# Patient Record
Sex: Male | Born: 1945 | Race: White | Hispanic: No | Smoking: Heavy tobacco smoker
Health system: Southern US, Community
[De-identification: ages and names within clinical notes are randomized; demographics above are authoritative.]

## PROBLEM LIST (undated history)

## (undated) DIAGNOSIS — I739 Peripheral vascular disease, unspecified: Secondary | ICD-10-CM

## (undated) DIAGNOSIS — M109 Gout, unspecified: Secondary | ICD-10-CM

## (undated) DIAGNOSIS — I10 Essential (primary) hypertension: Secondary | ICD-10-CM

## (undated) DIAGNOSIS — I779 Disorder of arteries and arterioles, unspecified: Secondary | ICD-10-CM

## (undated) DIAGNOSIS — I2510 Atherosclerotic heart disease of native coronary artery without angina pectoris: Secondary | ICD-10-CM

## (undated) DIAGNOSIS — Z972 Presence of dental prosthetic device (complete) (partial): Secondary | ICD-10-CM

## (undated) DIAGNOSIS — E785 Hyperlipidemia, unspecified: Secondary | ICD-10-CM

## (undated) DIAGNOSIS — I5020 Unspecified systolic (congestive) heart failure: Secondary | ICD-10-CM

## (undated) DIAGNOSIS — R200 Anesthesia of skin: Secondary | ICD-10-CM

## (undated) DIAGNOSIS — J449 Chronic obstructive pulmonary disease, unspecified: Secondary | ICD-10-CM

## (undated) DIAGNOSIS — M1990 Unspecified osteoarthritis, unspecified site: Secondary | ICD-10-CM

## (undated) HISTORY — PX: HIP FRACTURE SURGERY: SHX118

## (undated) HISTORY — PX: CORONARY ANGIOPLASTY: SHX604

---

## 2006-09-03 ENCOUNTER — Emergency Department: Payer: Self-pay | Admitting: Emergency Medicine

## 2006-12-11 ENCOUNTER — Inpatient Hospital Stay (HOSPITAL_COMMUNITY): Admission: EM | Admit: 2006-12-11 | Discharge: 2006-12-14 | Payer: Self-pay | Admitting: Emergency Medicine

## 2006-12-11 ENCOUNTER — Ambulatory Visit: Payer: Self-pay | Admitting: Internal Medicine

## 2009-10-05 ENCOUNTER — Inpatient Hospital Stay (HOSPITAL_COMMUNITY): Admission: EM | Admit: 2009-10-05 | Discharge: 2009-10-07 | Payer: Self-pay | Admitting: Emergency Medicine

## 2009-10-06 ENCOUNTER — Encounter (INDEPENDENT_AMBULATORY_CARE_PROVIDER_SITE_OTHER): Payer: Self-pay | Admitting: Internal Medicine

## 2009-10-07 ENCOUNTER — Ambulatory Visit: Payer: Self-pay | Admitting: Vascular Surgery

## 2009-10-07 ENCOUNTER — Encounter (INDEPENDENT_AMBULATORY_CARE_PROVIDER_SITE_OTHER): Payer: Self-pay | Admitting: Internal Medicine

## 2010-11-19 LAB — LIPID PANEL: Cholesterol: 105 mg/dL (ref 0–200)

## 2010-11-19 LAB — COMPREHENSIVE METABOLIC PANEL
ALT: 15 U/L (ref 0–53)
AST: 27 U/L (ref 0–37)
Albumin: 4.4 g/dL (ref 3.5–5.2)
BUN: 3 mg/dL — ABNORMAL LOW (ref 6–23)
CO2: 26 mEq/L (ref 19–32)
GFR calc Af Amer: >60 mL/min (ref >60)
GFR calc non Af Amer: >60 mL/min (ref >60)
Glucose, Bld: 141 mg/dL — ABNORMAL HIGH (ref 70–99)
Potassium: 4.4 mEq/L (ref 3.5–5.1)
Sodium: 144 mEq/L (ref 135–145)
Total Bilirubin: 0.8 mg/dL (ref 0.3–1.2)

## 2010-11-19 LAB — BASIC METABOLIC PANEL
CO2: 27 mEq/L (ref 19–32)
GFR calc Af Amer: >60 mL/min (ref >60)
GFR calc non Af Amer: >60 mL/min (ref >60)
Glucose, Bld: 92 mg/dL (ref 70–99)
Potassium: 3.6 mEq/L (ref 3.5–5.1)
Sodium: 145 mEq/L (ref 135–145)

## 2010-11-19 LAB — DIFFERENTIAL
Eosinophils Relative: 0 % (ref 0–5)
Lymphocytes Relative: 11 % — ABNORMAL LOW (ref 12–46)
Neutrophils Relative %: 85 % — ABNORMAL HIGH (ref 43–77)

## 2010-11-19 LAB — CARDIAC PANEL(CRET KIN+CKTOT+MB+TROPI)
CK, MB: 1 ng/mL (ref 0.3–4.0)
CK, MB: 1.1 ng/mL (ref 0.3–4.0)
CK, MB: 1.3 ng/mL (ref 0.3–4.0)
Relative Index: 1 (ref 0.0–2.5)
Relative Index: INVALID (ref 0.0–2.5)
Total CK: 89 U/L (ref 7–232)
Total CK: 98 U/L (ref 7–232)

## 2010-11-19 LAB — URINE CULTURE
Colony Count: NO GROWTH
Colony Count: NO GROWTH
Culture: NO GROWTH
Culture: NO GROWTH

## 2010-11-19 LAB — URINALYSIS, ROUTINE W REFLEX MICROSCOPIC
Hgb urine dipstick: NEGATIVE
Protein, ur: NEGATIVE mg/dL
Specific Gravity, Urine: 1.01 (ref 1.005–1.030)
pH: 8.5 — ABNORMAL HIGH (ref 5.0–8.0)

## 2010-11-19 LAB — HEMOGLOBIN A1C
Hgb A1c MFr Bld: 6.4 % — ABNORMAL HIGH (ref 4.6–6.1)
Mean Plasma Glucose: 137 mg/dL

## 2010-11-19 LAB — CK TOTAL AND CKMB (NOT AT ARMC): Total CK: 97 U/L (ref 7–232)

## 2010-11-19 LAB — CBC
HCT: 40.3 % (ref 39.0–52.0)
HCT: 45.5 % (ref 39.0–52.0)
Hemoglobin: 13.6 g/dL (ref 13.0–17.0)
MCV: 96.4 fL (ref 78.0–100.0)
Platelets: 149 10*3/uL — ABNORMAL LOW (ref 150–400)
WBC: 10.7 10*3/uL — ABNORMAL HIGH (ref 4.0–10.5)

## 2010-11-19 LAB — CULTURE, BLOOD (ROUTINE X 2): Culture: NO GROWTH

## 2010-11-19 LAB — RAPID URINE DRUG SCREEN, HOSP PERFORMED
Amphetamines: NOT DETECTED
Barbiturates: NOT DETECTED
Benzodiazepines: NOT DETECTED
Cocaine: NOT DETECTED
Opiates: POSITIVE — AB
Tetrahydrocannabinol: NOT DETECTED

## 2010-11-19 LAB — TROPONIN I: Troponin I: 0.01 ng/mL (ref 0.00–0.06)

## 2011-01-16 NOTE — Cardiovascular Report (Signed)
Richard Rivers, Richard [name]:  [id number]   MEDICAL RECORD NO.:  [id number]          PATIENT TYPE:  INP   LOCATION:  6741                         FACILITY:  [facility]   PHYSICIAN:  Veverly Fells. Excell Seltzer, MD  DATE OF BIRTH:  1946-08-02   DATE OF PROCEDURE:  12/13/2006  DATE OF DISCHARGE:                            CARDIAC CATHETERIZATION   PROCEDURES:  1. Left heart catheterization.  2. Selective coronary angiography.  3. Left ventricular angiography.  4. IVUS of the LAD and left circumflex.  5. Pullback to the left main.  6. StarClose of the right femoral artery.   INDICATIONS:  Richard Rivers is a very nice 65 year old gentleman with  multiple cardiovascular risk factors who presents with chest pain.  In  the setting of his multiple risk factors despite negative cardiac  enzymes and nondiagnostic EKG changes, he was referred for cardiac  catheterization.   Risks and indications of procedure were explained to the patient.  Informed consent was obtained.  The right groin was prepped, draped, and  anesthetized with 1% lidocaine.  Using a modified Seldinger technique, a  6-French sheath was placed in the right femoral artery.  Multiple views  of the left and right coronary arteries were taken using standard  catheters.  Following selective coronary angiography, an angled pigtail  catheter was inserted into left ventricle and pressures were recorded.  A pullback across the aortic valve was done.  A left ventriculogram was  performed.  Following the diagnostic portion of the procedure, I elected  to perform intravascular ultrasound of the left mainstem and left  circumflex.  Heparin was used for anticoagulation.  Initially the wire  was placed in the LAD and a pullback from the proximal LAD back through  the left mainstem was performed.  This demonstrated moderate left  mainstem disease with a minimal lumen area greater than 6 cm2.  There  was also a concerning area in the  proximal circumflex and I elected to  then place the wire in the circumflex and pullback across the proximal  circumflex into the left mainstem was performed.  At the conclusion of  the IVUS procedure, a StarClose device was used to seal the right  femoral arteriotomy.  There were no complications.   FINDINGS:  Aortic pressure 161/71 with mean of 108.  Left ventricular  pressure is 159/11 with a mean of 20.   The left mainstem has mild to moderate distal disease.  It bifurcates  into the LAD and left circumflex.  The percent stenosis by angiographic  visual estimate appears to be 40% to 50%.   The LAD is a large-caliber vessel that courses down to the LV apex.  It  gives off a large septal cascade in the area of the first septal  perforator.  There is a medium-sized diagonal branch given off from the  proximal portion of the LAD.  There are some very small second and third  diagonals.  The LAD has 30% proximal stenosis with mild calcium.  There  is no other significant angiographic disease seen throughout the LAD or  the diagonal branches.  The left circumflex has a 60% ostial stenosis.  There is a small  intermediate branch followed by what appears to be an occluded marginal  branch.  There are no collaterals to that area.  The circumflex then  goes down and gives off a second OM that is a large-caliber vessel.  There is a 60% stenosis in that area.  The true AV groove circumflex is  small.  There are left-to-right collaterals from the left circumflex  supplied to the distal right coronary artery.   Right coronary artery is occluded in its proximal portion.  It is  collateralized from the left coronary artery as above.   Left ventricular function assessed with 30 degrees RAO.  Left  ventriculogram demonstrates mild basal inferior wall hypokinesis with an  overall LVEF of 55%.   Intravascular ultrasound of the left mainstem shows moderate left  mainstem stenosis with a minimal  lumen area of 6.3 cm2.  The proximal  LAD is calcified with nonobstructive disease.   IVUS of the proximal left circumflex shows moderate disease in that  region as well.  The ostium of the left circumflex has a minimal lumen  area greater than 4 cm2.   ASSESSMENT:  1. Moderate left mainstem disease.  2. Right coronary artery occlusion supplied by collaterals from the      left coronary artery.  3. Moderate ostial left circumflex disease.  4. Mild left anterior descending artery disease.  5. Mild segmental left ventricular dysfunction with preserved left      ventricular ejection fraction   PLAN:  Initially I think it is appropriate to treat Richard Rivers with  aggressive medical therapy.  He does not meet criteria for coronary  bypass surgery, although his left mainstem minimal lumen area is close  to 6 cm2.  He has not had any objective evidence of infarction.  I think  aggressive medical therapy is warranted as first-line therapy.  If he  fails medical therapy, he will need be considered for coronary bypass  surgery.      Veverly Fells. Excell Seltzer, MD  Electronically Signed     MDC/MEDQ  D:  12/13/2006  T:  12/14/2006  Job:  [id number]
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
## 2011-02-01 IMAGING — CT CT HEAD W/O CM
1 of 2 series · 16 of 30 positions shown, 20 images · non-contrast
Comparison: None.

CLINICAL DATA: Altered level of consciousness, unresponsive
episode, slurred speech and unsteady gait.

CT HEAD WITHOUT CONTRAST
TECHNIQUE: Contiguous axial images were obtained from the base of
the skull through the vertex without contrast.

[Series 2: head routine 4.8 h37s · axial · 0.43mm/px · z∈[-145,+15]mm · 16 of 36 slices shown, 20 images]
[im 2/36  brain]
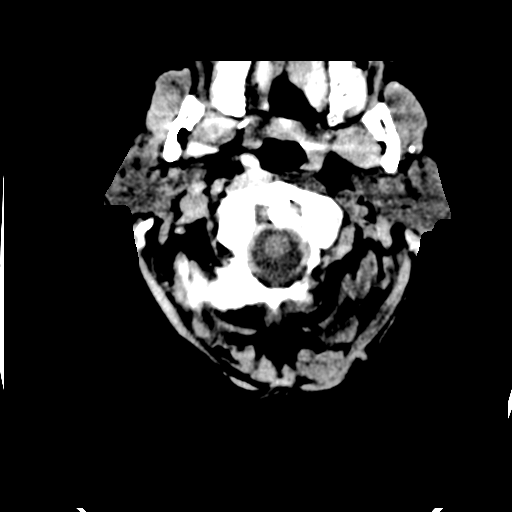
[im 2/36  bone]
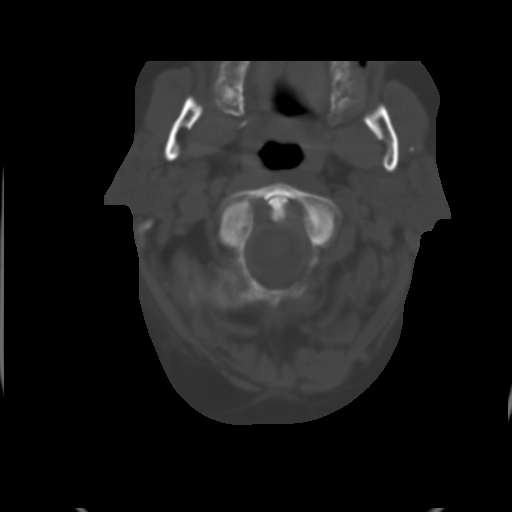
[im 4/36  brain]
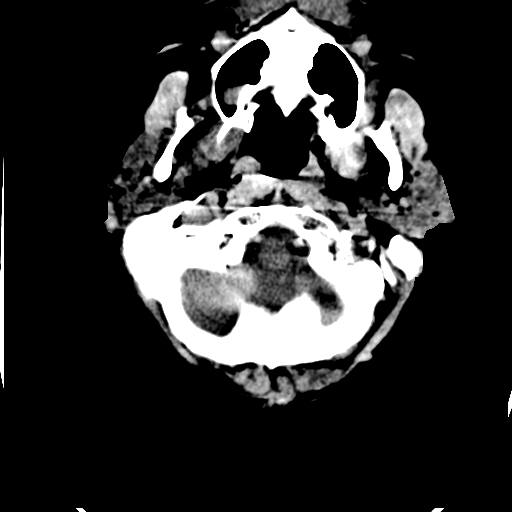
[im 6/36  brain]
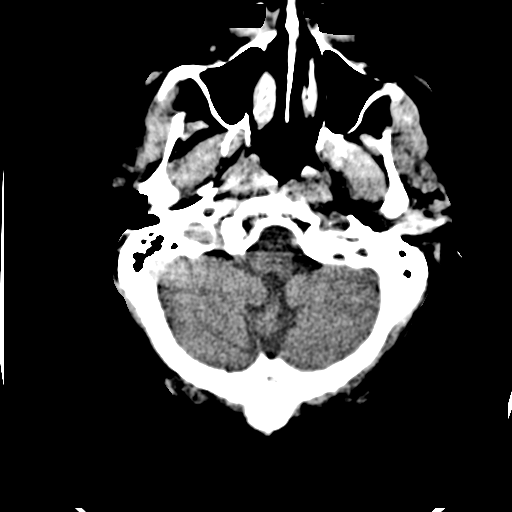
[im 8/36  brain]
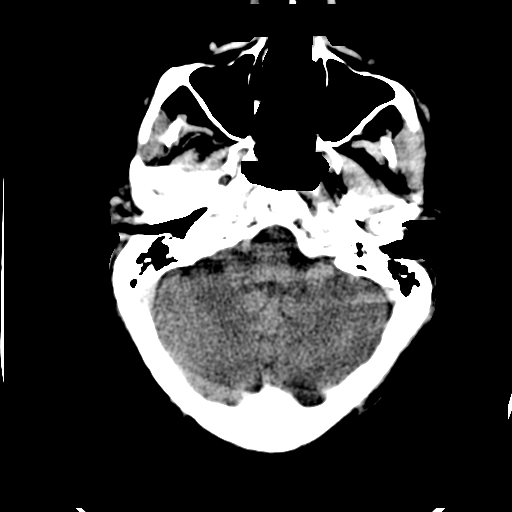
[im 12/36  brain]
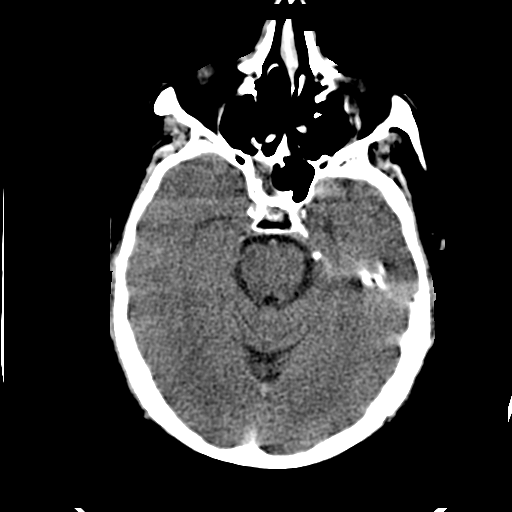
[im 12/36  bone]
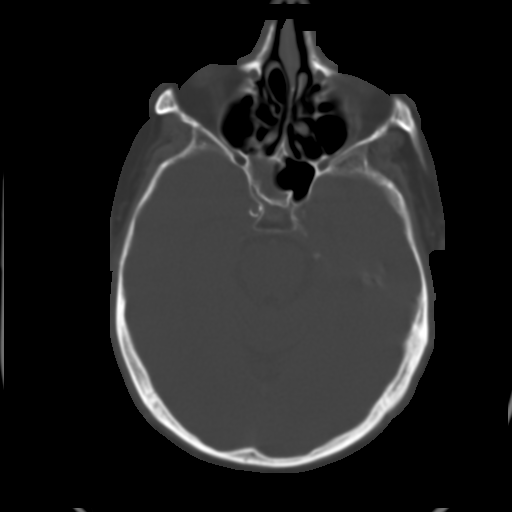
[im 13/36  brain]
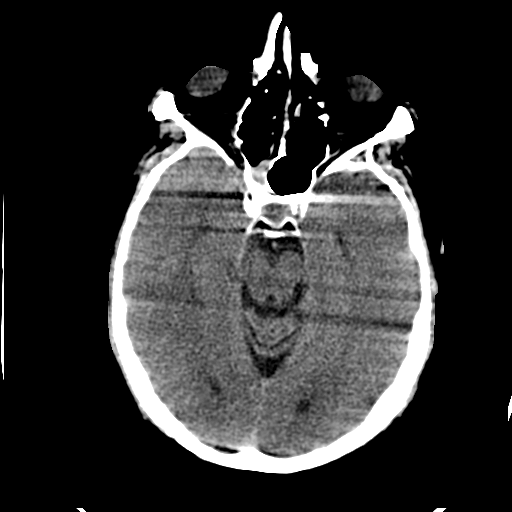
[im 15/36  brain]
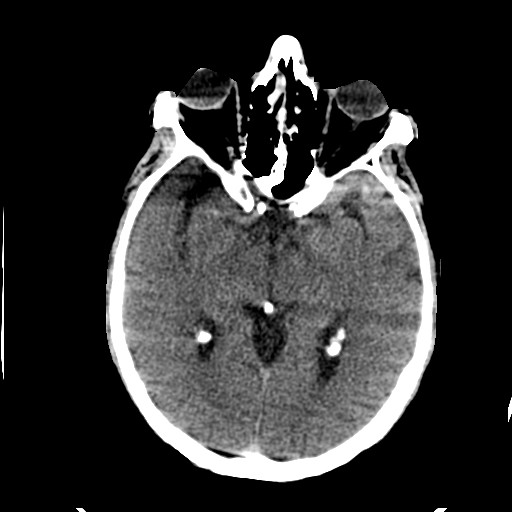
[im 17/36  brain]
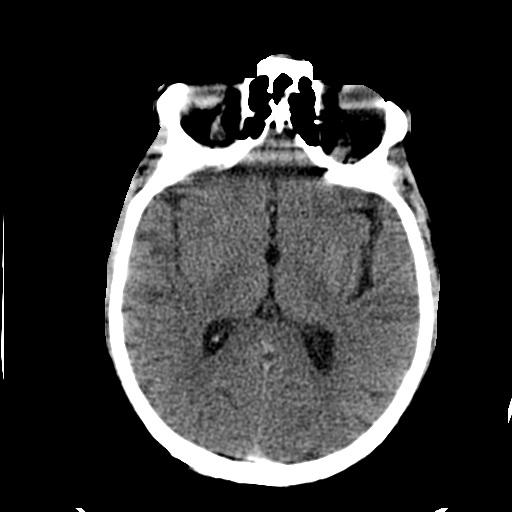
[im 19/36  brain]
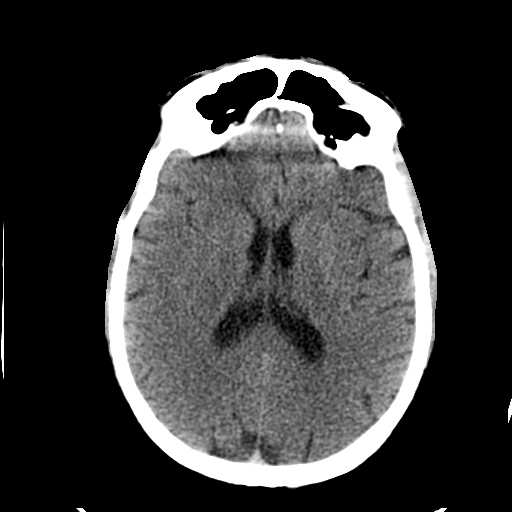
[im 19/36  bone]
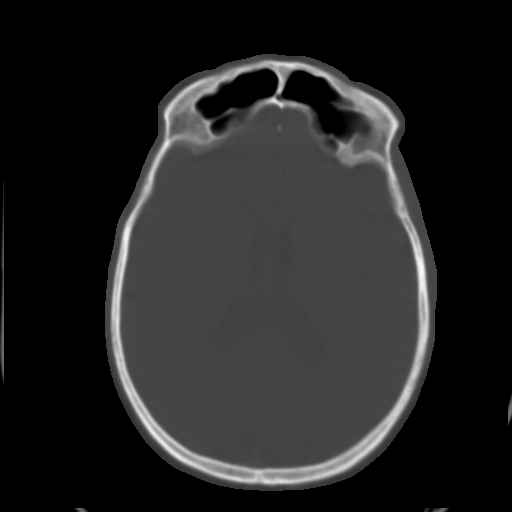
[im 21/36  brain]
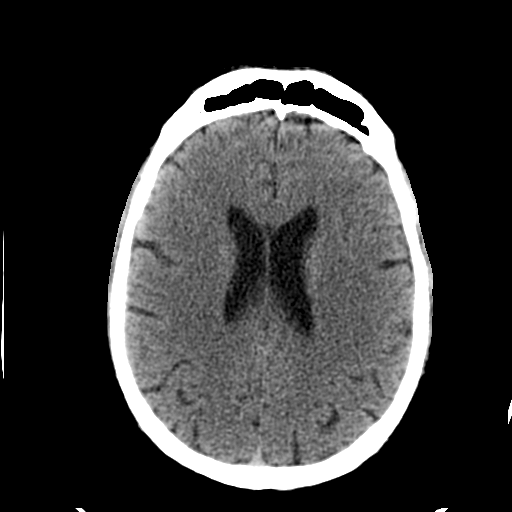
[im 23/36  brain]
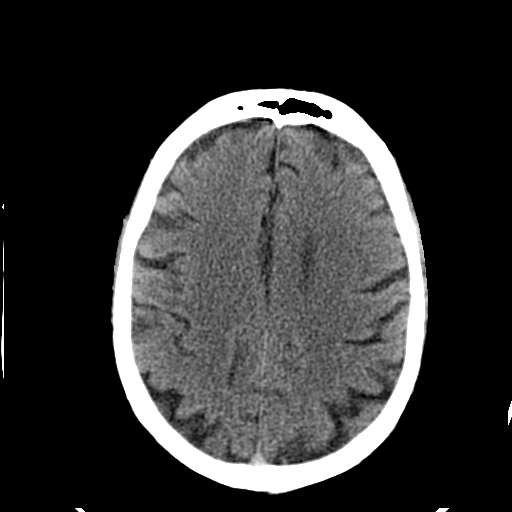
[im 24/36  brain]
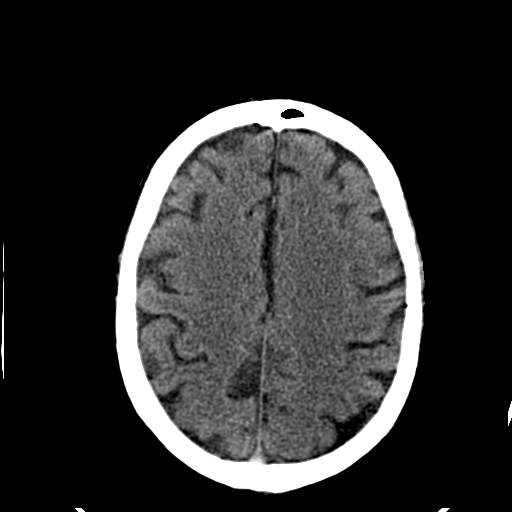
[im 28/36  brain]
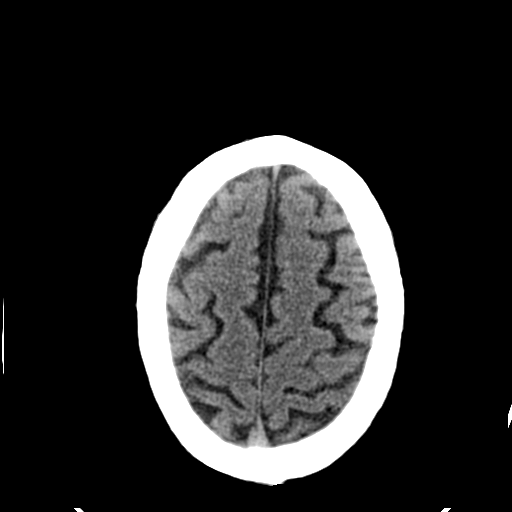
[im 28/36  bone]
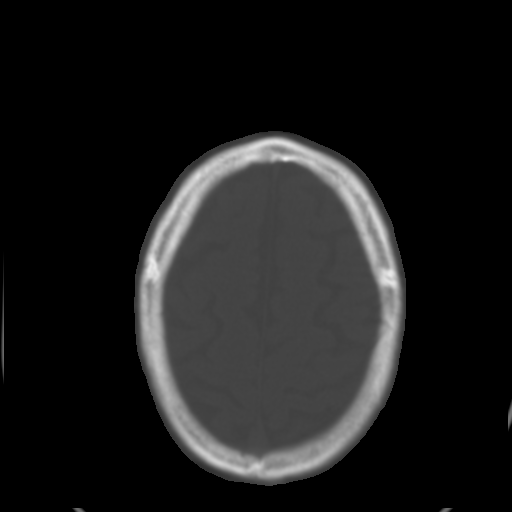
[im 30/36  brain]
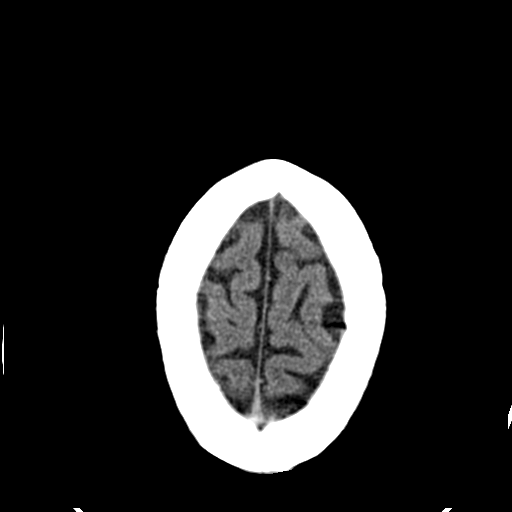
[im 32/36  brain]
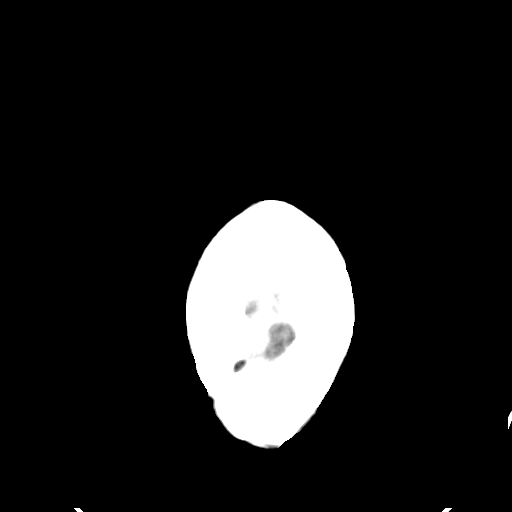
[im 34/36  brain]
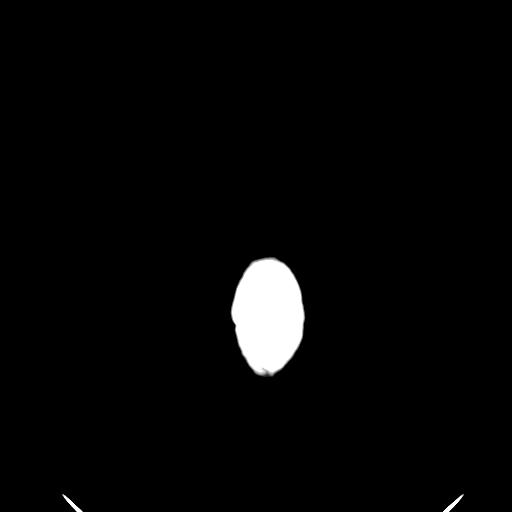

[16 of 30 positions shown; findings below may reference images not displayed]

FINDINGS: Examination was impaired by motion.  No acute hemorrhage,
infarction, mass effect, hydrocephalus or extra-axial fluid
collection identified.  No evidence of mass lesion.  The calvarium
is unremarkable.
IMPRESSION: Motion impaired scan.  No acute findings by head CT.

## 2014-11-05 ENCOUNTER — Inpatient Hospital Stay: Payer: Self-pay | Admitting: Internal Medicine
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
## 2014-12-30 NOTE — Op Note (Signed)
PATIENT NAME:  Richard Rivers, Richard Rivers MR#:  [id number] DATE OF BIRTH:  March 30, 1946  DATE OF PROCEDURE:  11/05/2014  PREOPERATIVE DIAGNOSIS: Nondisplaced right femoral neck fracture.   POSTOPERATIVE DIAGNOSIS: Nondisplaced right femoral neck fracture.   PROCEDURE: In situ cannulated screw fixation of right femoral neck fracture.   SURGEON:  Pascal Lux, MD.   ANESTHESIA: General endotracheal.   FINDINGS: As noted above.   COMPLICATIONS: None.   ESTIMATED BLOOD LOSS: Less than 25 mL.   TOTAL FLUIDS:  500 mL of crystalloid.   URINE OUTPUT: 300 mL.   TOURNIQUET: None.   DRAINS: None.   CLOSURE: Staples.   BRIEF CLINICAL NOTE: The patient is a 69 year old male who sustained the above-noted injury earlier today when he apparently became lightheaded when standing up too quickly and fell, landing on his right hip. He was brought to the Emergency Room where x-rays demonstrated the above-noted injury. He has been cleared medically and presents at this time for definitive management of his injury.   PROCEDURE: The patient was brought into the operating room and lain in the supine position. After adequate general endotracheal intubation and anesthesia were obtained, he was repositioned on the fracture table with his left leg placed in a flexed and abducted position in the well leg holder. The right lower extremity was placed in gentle longitudinal traction and some internal rotation to optimize fracture reduction and position. The adequacy of this fracture reduction and position was verified fluoroscopically in AP and lateral projections and found to be excellent. The lateral aspect of the right hip and thigh was prepped with ChloraPrep solution before being draped sterilely. Preoperative antibiotics were administered. An approximately 4-5 cm incision was made over the lateral aspect of the proximal femur just below the greater trochanter after determining the appropriate incision position  fluoroscopically. The incision was carried down through the subcutaneous tissues to expose the iliotibial band. This was split the length of the incision and the vastus lateralis fascia divided near its posterior margin to provide access to the lateral aspect of the proximal femur. Again, under fluoroscopic guidance, a 2 mm guidewire was placed up through the femoral neck at 135 degree angle to rest within 5 mm of subchondral bone. After verifying its position fluoroscopically in AP and lateral projections, two additional guidewires were placed parallel to the first screw in an inverted triangular fashion. Again the adequacy of pin position was verified fluoroscopically in AP and lateral projections and found to be excellent. Each of the pins was measured and overreamed to the appropriate depth before two 105 mm and one 115 mm Synthes 7.3 mm cannulated screws were inserted sequentially. Each was tightened securely. Again the adequacy of screw position was verified fluoroscopically in AP and lateral projections and found to be excellent. The wound was copiously irrigated with sterile saline solution before the iliotibial band was reapproximated using #0 Vicryl interrupted sutures. The subcutaneous tissues were closed in two layers using 2-0 Vicryl interrupted sutures before the skin was closed using staples. A sterile occlusive dressing was applied to the hip wound before the patient was transferred back to his hospital bed, where he was then awakened, extubated, and returned to the recovery room in satisfactory condition after tolerating the procedure well.    ____________________________ J. Dorien Chihuahua, MD jjp:bu D: 11/05/2014 19:38:57 ET T: 11/05/2014 21:07:32 ET JOB#: [id number]  cc: Pascal Lux, MD, <Dictator> Pascal Lux MD ELECTRONICALLY SIGNED 11/06/2014 14:26

## 2014-12-30 NOTE — Discharge Summary (Signed)
PATIENT NAME:  Richard Rivers, Richard Rivers MR#:  [id number] DATE OF BIRTH:  1946/03/31  DATE OF ADMISSION:  11/05/2014 DATE OF DISCHARGE:  11/08/2014    DISCHARGE DIAGNOSES:  1.  Non-distressed right femoral neck fracture repair with cannulated closed fixation.  2.  Elevated troponin. No significant uptrend.  3.  Left lower extremity superficial lesions after skin cancer surgeries.  4.  Hyponatremia, resolved.  5.  Chronic obstructive pulmonary disease, chronic, stable.  6.  Weight loss.  Outpatient follow-up with primary care physician for further work-up.  7.  Hypertension.   CONSULTATIONS: Orthopedics.   PROCEDURES: Right femoral neck with fracture repair by Dr. Roland Rack.   DISCHARGE MEDICATIONS: Acetaminophen 325 mg 2 tablets p.o. 3 times a day, allopurinol 300 mg 1 tablet p.o. once daily, ammonia lactate topical cream apply topically to affected area 2 times a day for dry skin, cetirizine 10 mg p.o. once daily for allergies, divalproex sodium 500 mg 2 tablets p.o. once daily, gabapentin 300 mg 1 capsule p.o. 3 times a day for neuro pain, Vistaril  1 capsule p.o. 3 times a day as needed for anxiety and nervousness, sulfa topical cream apply topically to affected area once a day, trazodone 50 mg 3 tablets p.o. at bedtime for posttraumatic stress disorder and sleep, aspirin 81 mg p.o. once daily, vitamin D2 50,000 international units 1 capsule p.o. once a week. Simvastatin 40 mg p.o. at bedtime, BuSpar 10 mg p.o. once daily, Coreg 12.5 mg p.o. b.i.d., oxycodone 5 mg 1 tablet p.o. every 4 hours as needed for moderate pain. Prescription was written by orthopedics dispensing 60 Lovenox 40 mg subcutaneously once a day for 14 days, aspirin 81 mg p.o. once daily, Milk of Magnesium 30 mL p.o. every 8 hours as needed for dyspepsia. Colace with Senna 1 tablet p.o. 2 times a day, nicotine 10 mg inhalation device. Continue the inhaler kit, albuterol-ipratropium 1 puff inhalation twice a day.   DRESSING CARE:  Per  orthopedics.  No need of home oxygen.   DIET: Low sodium, regular consistency.   ACTIVITY:  As recommended by physical therapy.  FOLLOW-UP:  With primary care physician in 1 week and with orthopedics in 2 weeks.   BRIEF HISTORY AND PHYSICAL AND HOSPITAL COURSE BASED ON THE PROBLEM: The patient is a 69 year old Caucasian male now with a history of bilateral lower extremity injury, posttraumatic stress disorder and peripheral vascular disease is presenting to the ED after he had a fall. The patient was somewhat lightheaded before he sustained the fall.  Please review his history and physical for details, diagnosed with a right hip injury and had right femoral neck fracture per x-ray report.  1.  Right femoral neck fracture.  The patient was admitted to the hospital regarding the fracture. Pain management was provided, orthopedics was consulted, and the patient had surgery on the same day on 11/05/2014.  Postoperatively, the patient was followed by orthopedics.  Dressing care and pain management was provided by them. Physical therapy started working with the patient. The patient tolerated his therapy well.   Had a bowel movement. Pain is well-controlled. The patient was provided with Lovenox subcutaneous for DVT prophylaxis .  The plan is to discharge the patient to a skilled nursing facility for continuation of rehabilitation as recommended by physical therapy.  Oxycodone and Lovenox prescriptions were written by orthopedics.  2.  Elevated troponin with no significant uptrend, probably from demand ischemia. The patient is asymptomatic. No further interventions were done.  3.  Hypertension.  Blood pressure is elevated. His home medication is Coreg increased to 12.5 mg, p.o. b.i.d.Marland Kitchen  The blood pressure is well-controlled at this point.  4.  Has chronic history of chronic obstructive pulmonary disease, which is stable. We will continue his home inhalers. 5.  Hyponatremia with dizziness, probably from  dehydration with IV fluids.  hyponatremia and dizziness were completely resolved.    6.  Weight loss with history of pulmonary nodule. The patient is to follow up with primary care physician for further work-up regarding this.  7.  Left lower extremity and chest lesions after skin cancer repair.  We will continue wound care.  8.  Tobacco abuse. Counseling was provided for 3 to 4 minutes. The patient will be continued on nicotine inhaler.    Overall condition is stable at the time of discharge.     CODE STATUS:  He is FULL CODE and will be transferred to skilled nursing facility under stable condition.    SIGNIFICANT LABORATORIES AND IMAGING STUDIES:  On 11/07/2014, BMP is normal. Troponins 0.02, 0.02, 0.08, 0.05, 0.07.  WBC 11.1, hemoglobin 11.8, hematocrit is 36.4, platelets are at 145.000.  His PT-INR in the normal range, right hip x-ray has revealed right femoral neck fracture.  Chest x-ray, 1 view, no active disease.   The diagnosis and plan of care was discussed in detail with the patient. He verbalized understanding of the plan.  All his questions were answered.   TOTAL TIME SPENT ON THE DISCHARGE AND COORDINATION OF CARE: 45 minutes.     ____________________________ Nicholes Mango, MD ag:DT D: 11/08/2014 12:07:24 ET T: 11/08/2014 12:38:51 ET JOB#: [id number]  cc: Nicholes Mango, MD, <Dictator> Nicholes Mango MD ELECTRONICALLY SIGNED 11/21/2014 14:29
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
## 2014-12-30 NOTE — Consult Note (Signed)
Brief Consult Note: Diagnosis: Right femoral neck fracture.   Patient was seen by consultant.   Consult note dictated.   Recommend to proceed with surgery or procedure.   Orders entered.   Discussed with Attending MD.   Comments: Pt. with non-displaced femoral neck fracture right hip. For in situ cannulated screw fixation when cleared by Medicine. Procedure discussed with patient who agrees to proceed.  Electronic Signatures: Dorien Chihuahua (MD)  (Signed 07-Mar-16 17:01)  Authored: Brief Consult Note   Last Updated: 07-Mar-16 17:01 by Dorien Chihuahua (MD)

## 2015-02-01 DIAGNOSIS — S72009A Fracture of unspecified part of neck of unspecified femur, initial encounter for closed fracture: Secondary | ICD-10-CM | POA: Insufficient documentation

## 2016-02-14 ENCOUNTER — Telehealth: Payer: Self-pay

## 2016-02-14 ENCOUNTER — Other Ambulatory Visit: Payer: Self-pay

## 2016-02-14 NOTE — Telephone Encounter (Signed)
Gastroenterology Pre-Procedure Review  Request Date: 03/05/16 Requesting Physician: Dr.   PATIENT REVIEW QUESTIONS: The patient responded to the following health history questions as indicated:    1. Are you having any GI issues? no 2. Do you have a personal history of Polyps? no 3. Do you have a family history of Colon Cancer or Polyps? no 4. Diabetes Mellitus? no 5. Joint replacements in the past 12 months?no 6. Major health problems in the past 3 months?no 7. Any artificial heart valves, MVP, or defibrillator?no    MEDICATIONS & ALLERGIES:    Patient reports the following regarding taking any anticoagulation/antiplatelet therapy:   Plavix, Coumadin, Eliquis, Xarelto, Lovenox, Pradaxa, Brilinta, or Effient? no Aspirin? yes (ASA 81mg )  Patient confirms/reports the following medications:  No current outpatient prescriptions on file.   No current facility-administered medications for this visit.    Patient confirms/reports the following allergies:  Allergies  Allergen Reactions  . Codeine Itching and Swelling    Throat swelling  . Penicillin G Swelling    No orders of the defined types were placed in this encounter.    AUTHORIZATION INFORMATION Primary Insurance: 1D#: Group #:  Secondary Insurance: 1D#: Group #:  SCHEDULE INFORMATION: Date: 03/05/16 Time: Location: [facility]

## 2016-02-17 ENCOUNTER — Other Ambulatory Visit: Payer: Self-pay

## 2016-02-27 ENCOUNTER — Telehealth: Payer: Self-pay | Admitting: Gastroenterology

## 2016-02-27 ENCOUNTER — Encounter: Payer: Self-pay | Admitting: *Deleted

## 2016-02-27 NOTE — Telephone Encounter (Signed)
Patient would like for you to call him back but didn't say why.

## 2016-03-02 ENCOUNTER — Other Ambulatory Visit: Payer: Self-pay

## 2016-03-02 MED ORDER — PEG 3350-KCL-NABCB-NACL-NASULF 236 G PO SOLR: 4000.0000 mL | Freq: Once | ORAL | Status: DC

## 2016-03-04 NOTE — Discharge Instructions (Signed)

## 2016-03-05 ENCOUNTER — Ambulatory Visit: Payer: Medicare Other | Admitting: Anesthesiology

## 2016-03-05 ENCOUNTER — Ambulatory Visit
Admission: RE | Admit: 2016-03-05 | Discharge: 2016-03-05 | Disposition: A | Discharge status: Home / Self Care | Payer: Medicare Other | Source: Ambulatory Visit | Attending: Gastroenterology | Admitting: Gastroenterology

## 2016-03-05 ENCOUNTER — Encounter
Admission: RE | Disposition: A | Discharge status: Home / Self Care | Payer: Self-pay | Source: Ambulatory Visit | Attending: Gastroenterology

## 2016-03-05 ENCOUNTER — Encounter: Payer: Self-pay | Admitting: *Deleted

## 2016-03-05 DIAGNOSIS — Z79899 Other long term (current) drug therapy: Secondary | ICD-10-CM | POA: Insufficient documentation

## 2016-03-05 DIAGNOSIS — Z7982 Long term (current) use of aspirin: Secondary | ICD-10-CM | POA: Insufficient documentation

## 2016-03-05 DIAGNOSIS — Z79891 Long term (current) use of opiate analgesic: Secondary | ICD-10-CM | POA: Insufficient documentation

## 2016-03-05 DIAGNOSIS — Z9889 Other specified postprocedural states: Secondary | ICD-10-CM | POA: Insufficient documentation

## 2016-03-05 DIAGNOSIS — Z1211 Encounter for screening for malignant neoplasm of colon: Secondary | ICD-10-CM | POA: Diagnosis not present

## 2016-03-05 DIAGNOSIS — D123 Benign neoplasm of transverse colon: Secondary | ICD-10-CM

## 2016-03-05 DIAGNOSIS — M19041 Primary osteoarthritis, right hand: Secondary | ICD-10-CM | POA: Insufficient documentation

## 2016-03-05 DIAGNOSIS — I10 Essential (primary) hypertension: Secondary | ICD-10-CM | POA: Diagnosis not present

## 2016-03-05 DIAGNOSIS — D125 Benign neoplasm of sigmoid colon: Secondary | ICD-10-CM

## 2016-03-05 DIAGNOSIS — Z9861 Coronary angioplasty status: Secondary | ICD-10-CM | POA: Insufficient documentation

## 2016-03-05 DIAGNOSIS — D121 Benign neoplasm of appendix: Secondary | ICD-10-CM

## 2016-03-05 DIAGNOSIS — M19042 Primary osteoarthritis, left hand: Secondary | ICD-10-CM | POA: Insufficient documentation

## 2016-03-05 DIAGNOSIS — D122 Benign neoplasm of ascending colon: Secondary | ICD-10-CM

## 2016-03-05 DIAGNOSIS — R200 Anesthesia of skin: Secondary | ICD-10-CM | POA: Diagnosis not present

## 2016-03-05 DIAGNOSIS — F17210 Nicotine dependence, cigarettes, uncomplicated: Secondary | ICD-10-CM | POA: Diagnosis not present

## 2016-03-05 HISTORY — PX: POLYPECTOMY: SHX5525

## 2016-03-05 HISTORY — PX: COLONOSCOPY WITH PROPOFOL: SHX5780

## 2016-03-05 HISTORY — DX: Essential (primary) hypertension: I10

## 2016-03-05 HISTORY — DX: Anesthesia of skin: R20.0

## 2016-03-05 HISTORY — DX: Unspecified osteoarthritis, unspecified site: M19.90

## 2016-03-05 HISTORY — DX: Presence of dental prosthetic device (complete) (partial): Z97.2

## 2016-03-05 SURGERY — COLONOSCOPY WITH PROPOFOL
Anesthesia: Monitor Anesthesia Care | Wound class: Contaminated

## 2016-03-05 MED ORDER — STERILE WATER FOR IRRIGATION IR SOLN
Status: DC | PRN
  Administered 2016-03-05: 10:00:00

## 2016-03-05 MED ORDER — PROPOFOL 10 MG/ML IV BOLUS
INTRAVENOUS | Status: DC | PRN
  Administered 2016-03-05 (×2): 30 mg via INTRAVENOUS
  Administered 2016-03-05: 100 mg via INTRAVENOUS
  Administered 2016-03-05: 10 mg via INTRAVENOUS
  Administered 2016-03-05: 20 mg via INTRAVENOUS
  Administered 2016-03-05 (×4): 10 mg via INTRAVENOUS
  Administered 2016-03-05: 40 mg via INTRAVENOUS
  Administered 2016-03-05: 10 mg via INTRAVENOUS
  Administered 2016-03-05: 40 mg via INTRAVENOUS
  Administered 2016-03-05: 10 mg via INTRAVENOUS

## 2016-03-05 MED ORDER — ACETAMINOPHEN 325 MG PO TABS
325.0000 mg | ORAL_TABLET | ORAL | Status: DC | PRN
Start: 2016-03-05 — End: 2016-03-05

## 2016-03-05 MED ORDER — LIDOCAINE HCL (CARDIAC) 20 MG/ML IV SOLN
INTRAVENOUS | Status: DC | PRN
  Administered 2016-03-05: 40 mg via INTRAVENOUS

## 2016-03-05 MED ORDER — ACETAMINOPHEN 160 MG/5ML PO SOLN
325.0000 mg | ORAL | Status: DC | PRN
Start: 2016-03-05 — End: 2016-03-05

## 2016-03-05 MED ORDER — LACTATED RINGERS IV SOLN
INTRAVENOUS | Status: DC
  Administered 2016-03-05 (×3): via INTRAVENOUS

## 2016-03-05 SURGICAL SUPPLY — 23 items
CANISTER SUCT 1200ML W/VALVE (MISCELLANEOUS) ×4 IMPLANT
CLIP HMST 235XBRD CATH ROT (MISCELLANEOUS) IMPLANT
CLIP RESOLUTION 360 11X235 (MISCELLANEOUS)
FCP ESCP3.2XJMB 240X2.8X (MISCELLANEOUS)
FORCEPS BIOP RAD 4 LRG CAP 4 (CUTTING FORCEPS) ×2 IMPLANT
FORCEPS BIOP RJ4 240 W/NDL (MISCELLANEOUS)
FORCEPS ESCP3.2XJMB 240X2.8X (MISCELLANEOUS) IMPLANT
GOWN CVR UNV OPN BCK APRN NK (MISCELLANEOUS) ×4 IMPLANT
GOWN ISOL THUMB LOOP REG UNIV (MISCELLANEOUS) ×8
INJECTOR VARIJECT VIN23 (MISCELLANEOUS) IMPLANT
KIT DEFENDO VALVE AND CONN (KITS) IMPLANT
KIT ENDO PROCEDURE OLY (KITS) ×4 IMPLANT
MARKER SPOT ENDO TATTOO 5ML (MISCELLANEOUS) IMPLANT
PAD GROUND ADULT SPLIT (MISCELLANEOUS) ×2 IMPLANT
PROBE APC STR FIRE (PROBE) IMPLANT
RETRIEVER NET ROTH 2.5X230 LF (MISCELLANEOUS) ×4 IMPLANT
SNARE SHORT THROW 13M SML OVAL (MISCELLANEOUS) ×2 IMPLANT
SNARE SHORT THROW 30M LRG OVAL (MISCELLANEOUS) IMPLANT
SNARE SNG USE RND 15MM (INSTRUMENTS) IMPLANT
SPOT EX ENDOSCOPIC TATTOO (MISCELLANEOUS)
TRAP ETRAP POLY (MISCELLANEOUS) ×2 IMPLANT
VARIJECT INJECTOR VIN23 (MISCELLANEOUS)
WATER STERILE IRR 250ML POUR (IV SOLUTION) ×4 IMPLANT

## 2016-03-05 NOTE — Op Note (Signed)
[facility] Gastroenterology Patient Name: Richard Rivers Procedure Date: 03/05/2016 9:51 AM MRN: [id number] Account #: [id number] Date of Birth: [birth year] Admit Type: Outpatient Age: 70 Room: [facility] OR ROOM 01 Gender: Male Note Status: Finalized Procedure:            Colonoscopy Indications:          Screening for colorectal malignant neoplasm Providers:            Lucilla Lame, MD Medicines:            Propofol per Anesthesia Complications:        No immediate complications. Procedure:            Pre-Anesthesia Assessment:                       - Prior to the procedure, a History and Physical was                        performed, and patient medications and allergies were                        reviewed. The patient's tolerance of previous                        anesthesia was also reviewed. The risks and benefits of                        the procedure and the sedation options and risks were                        discussed with the patient. All questions were                        answered, and informed consent was obtained. Prior                        Anticoagulants: The patient has taken no previous                        anticoagulant or antiplatelet agents. ASA Grade                        Assessment: II - A patient with mild systemic disease.                        After reviewing the risks and benefits, the patient was                        deemed in satisfactory condition to undergo the                        procedure.                       After obtaining informed consent, the colonoscope was                        passed under direct vision. Throughout the procedure,                        the patient's blood pressure, pulse,  and oxygen                        saturations were monitored continuously. The                        Colonoscope was introduced through the anus and                        advanced to the the cecum, identified by appendiceal                        orifice and ileocecal valve. The colonoscopy was                        performed without difficulty. The patient tolerated the                        procedure well. The quality of the bowel preparation                        was good. Findings:      The perianal and digital rectal examinations were normal.      A 10 mm polyp was found in the sigmoid colon. The polyp was sessile. The       polyp was removed with a hot snare. Resection and retrieval were       complete.      Five sessile polyps were found in the transverse colon. The polyps were       3 to 8 mm in size. These polyps were removed with a cold snare.       Resection and retrieval were complete.      A 5 mm polyp was found in the ascending colon. The polyp was sessile.       The polyp was removed with a cold snare. Resection and retrieval were       complete.      A medium polyp was found in the appendiceal orifice. The polyp was       sessile. Biopsies were taken with a cold forceps for histology. Impression:           - One 10 mm polyp in the sigmoid colon, removed with a                        hot snare. Resected and retrieved.                       - Five 3 to 8 mm polyps in the transverse colon,                        removed with a cold snare. Resected and retrieved.                       - One 5 mm polyp in the ascending colon, removed with a                        cold snare. Resected and retrieved.                       - One medium polyp at the appendiceal orifice. Biopsied. Recommendation:       -  Await pathology results.                       - If appendix polyp adenomatous then will need surgical                        consult for removal. Procedure Code(s):    --- Professional ---                       [phone], Colonoscopy, flexible; with removal of tumor(s),                        polyp(s), or other lesion(s) by snare technique                       45380, 59, Colonoscopy, flexible; with  biopsy, single                        or multiple Diagnosis Code(s):    --- Professional ---                       Z12.11, Encounter for screening for malignant neoplasm                        of colon                       D12.5, Benign neoplasm of sigmoid colon                       D12.2, Benign neoplasm of ascending colon                       D12.1, Benign neoplasm of appendix                       D12.3, Benign neoplasm of transverse colon (hepatic                        flexure or splenic flexure) CPT copyright 2016 American Medical Association. All rights reserved. The codes documented in this report are preliminary and upon coder review may  be revised to meet current compliance requirements. Lucilla Lame, MD 03/05/2016 10:34:44 AM This report has been signed electronically. Number of Addenda: 0 Note Initiated On: 03/05/2016 9:51 AM Scope Withdrawal Time: 0 hours 13 minutes 46 seconds  Total Procedure Duration: 0 hours 23 minutes 16 seconds       [facility]

## 2016-03-05 NOTE — Anesthesia Procedure Notes (Signed)
Procedure Name: MAC Performed by: Mayme Genta Pre-anesthesia Checklist: Patient identified, Emergency Drugs available, Suction available, Patient being monitored and Timeout performed Patient Re-evaluated:Patient Re-evaluated prior to inductionOxygen Delivery Method: Nasal cannula

## 2016-03-05 NOTE — Anesthesia Postprocedure Evaluation (Signed)
Anesthesia Post Note  Patient: Richard Rivers  Procedure(s) Performed: Procedure(s) (LRB): COLONOSCOPY WITH PROPOFOL (N/A) POLYPECTOMY  Patient location during evaluation: PACU Anesthesia Type: MAC Level of consciousness: awake and alert and oriented Pain management: satisfactory to patient Vital Signs Assessment: post-procedure vital signs reviewed and stable Respiratory status: spontaneous breathing, nonlabored ventilation and respiratory function stable Cardiovascular status: blood pressure returned to baseline and stable Postop Assessment: Adequate PO intake and No signs of nausea or vomiting Anesthetic complications: no    Raliegh Ip
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
## 2016-03-05 NOTE — Anesthesia Preprocedure Evaluation (Signed)
Anesthesia Evaluation  Patient identified by MRN, date of birth, ID band  Reviewed: Allergy & Precautions, H&P , NPO status , Patient's Chart, lab work & pertinent test results  Airway Mallampati: II  TM Distance: >3 FB Neck ROM: full    Dental no notable dental hx. (+) Edentulous Lower, Upper Dentures   Pulmonary Current Smoker,    Pulmonary exam normal        Cardiovascular hypertension, + CAD   Rhythm:regular Rate:Normal     Neuro/Psych PSYCHIATRIC DISORDERS    GI/Hepatic   Endo/Other    Renal/GU      Musculoskeletal   Abdominal   Peds  Hematology   Anesthesia Other Findings   Reproductive/Obstetrics                             Anesthesia Physical Anesthesia Plan  ASA: II  Anesthesia Plan: MAC   Post-op Pain Management:    Induction:   Airway Management Planned:   Additional Equipment:   Intra-op Plan:   Post-operative Plan:   Informed Consent: I have reviewed the patients History and Physical, chart, labs and discussed the procedure including the risks, benefits and alternatives for the proposed anesthesia with the patient or authorized representative who has indicated his/her understanding and acceptance.     Plan Discussed with: CRNA  Anesthesia Plan Comments:         Anesthesia Quick Evaluation

## 2016-03-05 NOTE — Transfer of Care (Signed)
Immediate Anesthesia Transfer of Care Note  Patient: Richard Rivers  Procedure(s) Performed: Procedure(s): COLONOSCOPY WITH PROPOFOL (N/A) POLYPECTOMY  Patient Location: PACU  Anesthesia Type: MAC  Level of Consciousness: awake, alert  and patient cooperative  Airway and Oxygen Therapy: Patient Spontanous Breathing and Patient connected to supplemental oxygen  Post-op Assessment: Post-op Vital signs reviewed, Patient's Cardiovascular Status Stable, Respiratory Function Stable, Patent Airway and No signs of Nausea or vomiting  Post-op Vital Signs: Reviewed and stable  Complications: No apparent anesthesia complications

## 2016-03-06 ENCOUNTER — Encounter: Payer: Self-pay | Admitting: Gastroenterology

## 2016-03-06 NOTE — Telephone Encounter (Signed)
Pt had colonoscopy on 03/05/16.

## 2016-03-09 ENCOUNTER — Encounter: Payer: Self-pay | Admitting: Gastroenterology

## 2016-03-11 NOTE — Telephone Encounter (Signed)
Pt scheduled for a follow up appt on Wednesday, July 26th @ 9:15am.

## 2016-03-11 NOTE — Telephone Encounter (Signed)
-----   Message from Lucilla Lame, MD sent at 03/10/2016 12:35 PM EDT ----- Please have the patient come in for a follow up.

## 2016-03-12 ENCOUNTER — Telehealth: Payer: Self-pay

## 2016-03-12 NOTE — Telephone Encounter (Signed)
-----   Message from Lucilla Lame, MD sent at 03/12/2016  1:18 PM EDT ----- Set the patient up for a surgical consult with Dr. Azalee Course and let him know that the appendix polyp was precancerous but not cancerous.

## 2016-03-13 NOTE — Telephone Encounter (Signed)
Pt notified he will need to be set up for a surgical consult due to the appendix polyp removal. Referral request faxed to [facility] for approval. Will schedule pt with Dr. Azalee Course once we get the approval.

## 2016-03-13 NOTE — Telephone Encounter (Deleted)
-----   Message from Lucilla Lame, MD sent at 03/12/2016  1:18 PM EDT ----- Set the patient up for a surgical consult with Dr. Azalee Course and let him know that the appendix polyp was precancerous but not cancerous.

## 2016-03-25 ENCOUNTER — Ambulatory Visit: Payer: Medicare Other | Admitting: Gastroenterology

## 2018-09-05 ENCOUNTER — Ambulatory Visit: Payer: Medicare Other | Admitting: Podiatry

## 2018-09-05 ENCOUNTER — Ambulatory Visit (INDEPENDENT_AMBULATORY_CARE_PROVIDER_SITE_OTHER): Payer: Non-veteran care | Admitting: Podiatry

## 2018-09-05 ENCOUNTER — Encounter: Payer: Self-pay | Admitting: Podiatry

## 2018-09-05 VITALS — BP 109/57 | HR 72

## 2018-09-05 DIAGNOSIS — M79675 Pain in left toe(s): Secondary | ICD-10-CM | POA: Diagnosis not present

## 2018-09-05 DIAGNOSIS — M79674 Pain in right toe(s): Secondary | ICD-10-CM

## 2018-09-05 DIAGNOSIS — S98112S Complete traumatic amputation of left great toe, sequela: Secondary | ICD-10-CM

## 2018-09-05 DIAGNOSIS — B351 Tinea unguium: Secondary | ICD-10-CM | POA: Diagnosis not present
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
## 2018-12-05 ENCOUNTER — Ambulatory Visit: Payer: Non-veteran care | Admitting: Podiatry

## 2022-10-24 ENCOUNTER — Emergency Department
Admission: EM | Admit: 2022-10-24 | Discharge: 2022-10-24 | Disposition: A | Discharge status: Home / Self Care | Payer: Medicare Other | Attending: Emergency Medicine | Admitting: Emergency Medicine

## 2022-10-24 ENCOUNTER — Emergency Department: Payer: Medicare Other

## 2022-10-24 ENCOUNTER — Other Ambulatory Visit: Payer: Self-pay

## 2022-10-24 DIAGNOSIS — M25532 Pain in left wrist: Secondary | ICD-10-CM | POA: Insufficient documentation

## 2022-10-24 MED ORDER — KETOROLAC TROMETHAMINE 15 MG/ML IJ SOLN
15.0000 mg | Freq: Once | INTRAMUSCULAR | Status: AC
  Administered 2022-10-24: 15 mg via INTRAMUSCULAR
  Filled 2022-10-24: qty 1

## 2022-10-24 MED ORDER — CARVEDILOL 6.25 MG PO TABS
12.5000 mg | ORAL_TABLET | Freq: Two times a day (BID) | ORAL | Status: DC
  Administered 2022-10-24: 12.5 mg via ORAL
  Filled 2022-10-24: qty 2

## 2022-10-24 MED ORDER — COLCHICINE 0.6 MG PO TABS: 0.6000 mg | ORAL_TABLET | Freq: Every day | ORAL | 0 refills | Status: DC

## 2022-10-24 MED ORDER — COLCHICINE 0.6 MG PO TABS
0.6000 mg | ORAL_TABLET | Freq: Every day | ORAL | Status: DC
  Administered 2022-10-24: 0.6 mg via ORAL
  Filled 2022-10-24: qty 1

## 2022-10-24 MED ORDER — DOXYCYCLINE MONOHYDRATE 100 MG PO TABS: 100.0000 mg | ORAL_TABLET | Freq: Two times a day (BID) | ORAL | 0 refills | Status: AC

## 2022-10-24 NOTE — ED Provider Notes (Signed)
[facility] Provider Note  Patient Contact: 4:44 PM (approximate)   History   Wrist Pain (LEFT)   HPI  Richard Rivers is a 77 y.o. male presents to the emergency department with swelling and erythema of the left wrist.  Patient has a history of gout and states that he has had increased intake of seafood and red meat.  He denies a history of joints or cellulitis.  He denies fever and chills at home.  He has not had his blood pressure medication today.      Physical Exam   Triage Vital Signs: ED Triage Vitals  Enc Vitals Group     BP 10/24/22 1520 (!) 197/86     Pulse Rate 10/24/22 1520 100     Resp 10/24/22 1520 16     Temp 10/24/22 1520 98.1 F (36.7 C)     Temp Source 10/24/22 1520 Oral     SpO2 10/24/22 1520 98 %     Weight 10/24/22 1521 165 lb (74.8 kg)     Height 10/24/22 1521 6' (1.829 m)     Head Circumference --      Peak Flow --      Pain Score 10/24/22 1520 10     Pain Loc --      Pain Edu? --      Excl. in [place]? --     Most recent vital signs: Vitals:   10/24/22 1520  BP: (!) 197/86  Pulse: 100  Resp: 16  Temp: 98.1 F (36.7 C)  SpO2: 98%     General: Alert and in no acute distress. Eyes:  PERRL. EOMI. Head: No acute traumatic findings ENT:      Nose: No congestion/rhinnorhea.      Mouth/Throat: Mucous membranes are moist. Neck: No stridor. No cervical spine tenderness to palpation. Cardiovascular:  Good peripheral perfusion Respiratory: Normal respiratory effort without tachypnea or retractions. Lungs CTAB. Good air entry to the bases with no decreased or absent breath sounds. Gastrointestinal: Bowel sounds 4 quadrants. Soft and nontender to palpation. No guarding or rigidity. No palpable masses. No distention. No CVA tenderness. Musculoskeletal: Full range of motion to all extremities.  Patient has erythema and edema of the left wrist.  Palpable radial and ulnar pulses bilaterally and symmetrically.  No  streaking. Neurologic:  No gross focal neurologic deficits are appreciated.  Skin:   No rash noted    ED Results / Procedures / Treatments   Labs (all labs ordered are listed, but only abnormal results are displayed) Labs Reviewed - No data to display     RADIOLOGY  I personally viewed and evaluated these images as part of my medical decision making, as well as reviewing the written report by the radiologist.  ED Provider Interpretation: Patient has soft tissue swelling along the distal aspect of the left wrist.  No acute bony abnormality.   PROCEDURES:  Critical Care performed: No  Procedures   MEDICATIONS ORDERED IN ED: Medications  carvedilol (COREG) tablet 12.5 mg (has no administration in time range)  ketorolac (TORADOL) 15 MG/ML injection 15 mg (has no administration in time range)  colchicine tablet 0.6 mg (has no administration in time range)     IMPRESSION / MDM / ASSESSMENT AND PLAN / ED COURSE  I reviewed the triage vital signs and the nursing notes.  Assessment and plan Wrist pain 77 year old male presents to the emergency department with erythema and edema of the left wrist.  Patient was hypertensive at triage but vital signs otherwise reassuring.  On exam, patient did have pain with range of motion of the left wrist but was able to perform range of motion.  He did have edema and overlying erythema.  Differential diagnosis includes gout, hand cellulitis, septic joint...  Given patient's recent increased intake of red meat and seafood, I have increased suspicion for gout.  I did give patient 15 mg of IM Toradol in the emergency department and started him on his first dose of colchicine.  Patient reports that he has not had his daily dose of Coreg.  Patient was given his usual dosage while in the emergency department.  I did discuss starting colchicine with pharmacist on-call who feels that 0.6 mg daily for the next 4 days will  be pharmacologically safe for patient while taking Coreg.     FINAL CLINICAL IMPRESSION(S) / ED DIAGNOSES   Final diagnoses:  Left wrist pain     Rx / DC Orders   ED Discharge Orders          Ordered    colchicine 0.6 MG tablet  Daily        10/24/22 1640    doxycycline (ADOXA) 100 MG tablet  2 times daily        10/24/22 1641             Note:  This document was prepared using Dragon voice recognition software and may include unintentional dictation errors.   Vallarie Mare [place], PA-C 10/24/22 1726    Nathaniel Man, MD 10/24/22 2012

## 2022-10-24 NOTE — Discharge Instructions (Addendum)
Take colchicine once daily for the next 4 days. If symptoms worsen over the next 2 days, please return to the emergency department for reevaluation. Take Doxycycline twice daily for seven days.

## 2022-10-24 NOTE — ED Triage Notes (Signed)
Pt to ED from home for left sided wrist pain. Pt woke up yesterday morning wit hthe pain. He wrapped in ace bandage and has had no improvement. Pt has not taken any OTC meds for relief. Pt denies any trauma and any falls.

## 2023-05-27 ENCOUNTER — Other Ambulatory Visit: Payer: Self-pay | Admitting: Ophthalmology

## 2023-05-27 DIAGNOSIS — H34231 Retinal artery branch occlusion, right eye: Secondary | ICD-10-CM

## 2023-06-07 ENCOUNTER — Ambulatory Visit
Admission: RE | Admit: 2023-06-07 | Discharge: 2023-06-07 | Disposition: A | Discharge status: Home / Self Care | Payer: Medicare Other | Source: Ambulatory Visit | Attending: Ophthalmology | Admitting: Ophthalmology

## 2023-06-07 DIAGNOSIS — H34231 Retinal artery branch occlusion, right eye: Secondary | ICD-10-CM | POA: Diagnosis present

## 2023-08-10 ENCOUNTER — Encounter (INDEPENDENT_AMBULATORY_CARE_PROVIDER_SITE_OTHER): Payer: Non-veteran care | Admitting: Vascular Surgery

## 2023-09-01 ENCOUNTER — Other Ambulatory Visit: Payer: Self-pay

## 2023-09-01 ENCOUNTER — Encounter: Payer: Self-pay | Admitting: Internal Medicine

## 2023-09-01 ENCOUNTER — Emergency Department: Payer: No Typology Code available for payment source

## 2023-09-01 ENCOUNTER — Inpatient Hospital Stay
Admission: EM | Admit: 2023-09-01 | Discharge: 2023-09-03 | DRG: 280 | Disposition: A | Discharge status: Home / Self Care | Payer: No Typology Code available for payment source | Attending: Obstetrics and Gynecology | Admitting: Obstetrics and Gynecology

## 2023-09-01 DIAGNOSIS — F418 Other specified anxiety disorders: Secondary | ICD-10-CM | POA: Diagnosis not present

## 2023-09-01 DIAGNOSIS — I509 Heart failure, unspecified: Secondary | ICD-10-CM

## 2023-09-01 DIAGNOSIS — E785 Hyperlipidemia, unspecified: Secondary | ICD-10-CM | POA: Diagnosis present

## 2023-09-01 DIAGNOSIS — I5A Non-ischemic myocardial injury (non-traumatic): Secondary | ICD-10-CM | POA: Diagnosis present

## 2023-09-01 DIAGNOSIS — J90 Pleural effusion, not elsewhere classified: Secondary | ICD-10-CM | POA: Diagnosis present

## 2023-09-01 DIAGNOSIS — I10 Essential (primary) hypertension: Secondary | ICD-10-CM | POA: Diagnosis present

## 2023-09-01 DIAGNOSIS — I2510 Atherosclerotic heart disease of native coronary artery without angina pectoris: Secondary | ICD-10-CM | POA: Diagnosis present

## 2023-09-01 DIAGNOSIS — Z7982 Long term (current) use of aspirin: Secondary | ICD-10-CM | POA: Diagnosis not present

## 2023-09-01 DIAGNOSIS — Z1152 Encounter for screening for COVID-19: Secondary | ICD-10-CM

## 2023-09-01 DIAGNOSIS — J441 Chronic obstructive pulmonary disease with (acute) exacerbation: Secondary | ICD-10-CM | POA: Diagnosis present

## 2023-09-01 DIAGNOSIS — M19041 Primary osteoarthritis, right hand: Secondary | ICD-10-CM | POA: Diagnosis present

## 2023-09-01 DIAGNOSIS — I5020 Unspecified systolic (congestive) heart failure: Secondary | ICD-10-CM

## 2023-09-01 DIAGNOSIS — I272 Pulmonary hypertension, unspecified: Secondary | ICD-10-CM | POA: Diagnosis present

## 2023-09-01 DIAGNOSIS — Z79899 Other long term (current) drug therapy: Secondary | ICD-10-CM

## 2023-09-01 DIAGNOSIS — M19042 Primary osteoarthritis, left hand: Secondary | ICD-10-CM | POA: Diagnosis present

## 2023-09-01 DIAGNOSIS — Z880 Allergy status to penicillin: Secondary | ICD-10-CM

## 2023-09-01 DIAGNOSIS — Z7983 Long term (current) use of bisphosphonates: Secondary | ICD-10-CM

## 2023-09-01 DIAGNOSIS — Z79891 Long term (current) use of opiate analgesic: Secondary | ICD-10-CM

## 2023-09-01 DIAGNOSIS — F17210 Nicotine dependence, cigarettes, uncomplicated: Secondary | ICD-10-CM | POA: Diagnosis present

## 2023-09-01 DIAGNOSIS — Z885 Allergy status to narcotic agent status: Secondary | ICD-10-CM | POA: Diagnosis not present

## 2023-09-01 DIAGNOSIS — I21A1 Myocardial infarction type 2: Secondary | ICD-10-CM | POA: Diagnosis present

## 2023-09-01 DIAGNOSIS — R0602 Shortness of breath: Secondary | ICD-10-CM | POA: Diagnosis present

## 2023-09-01 DIAGNOSIS — I739 Peripheral vascular disease, unspecified: Secondary | ICD-10-CM | POA: Diagnosis present

## 2023-09-01 DIAGNOSIS — I5021 Acute systolic (congestive) heart failure: Secondary | ICD-10-CM | POA: Diagnosis present

## 2023-09-01 DIAGNOSIS — F411 Generalized anxiety disorder: Secondary | ICD-10-CM | POA: Diagnosis present

## 2023-09-01 DIAGNOSIS — I5031 Acute diastolic (congestive) heart failure: Secondary | ICD-10-CM

## 2023-09-01 DIAGNOSIS — Z801 Family history of malignant neoplasm of trachea, bronchus and lung: Secondary | ICD-10-CM

## 2023-09-01 DIAGNOSIS — Z955 Presence of coronary angioplasty implant and graft: Secondary | ICD-10-CM | POA: Diagnosis not present

## 2023-09-01 DIAGNOSIS — F1020 Alcohol dependence, uncomplicated: Secondary | ICD-10-CM | POA: Diagnosis present

## 2023-09-01 DIAGNOSIS — J9691 Respiratory failure, unspecified with hypoxia: Secondary | ICD-10-CM | POA: Diagnosis present

## 2023-09-01 DIAGNOSIS — I110 Hypertensive heart disease with heart failure: Principal | ICD-10-CM | POA: Diagnosis present

## 2023-09-01 DIAGNOSIS — M109 Gout, unspecified: Secondary | ICD-10-CM | POA: Diagnosis present

## 2023-09-01 DIAGNOSIS — G629 Polyneuropathy, unspecified: Secondary | ICD-10-CM

## 2023-09-01 HISTORY — DX: Gout, unspecified: M10.9

## 2023-09-01 HISTORY — DX: Atherosclerotic heart disease of native coronary artery without angina pectoris: I25.10

## 2023-09-01 HISTORY — DX: Hyperlipidemia, unspecified: E78.5

## 2023-09-01 LAB — RESP PANEL BY RT-PCR (RSV, FLU A&B, COVID)  RVPGX2
Influenza A by PCR: NEGATIVE
Influenza B by PCR: NEGATIVE
Resp Syncytial Virus by PCR: NEGATIVE
SARS Coronavirus 2 by RT PCR: NEGATIVE

## 2023-09-01 LAB — MAGNESIUM: Magnesium: 1.9 mg/dL (ref 1.7–2.4)

## 2023-09-01 LAB — COMPREHENSIVE METABOLIC PANEL
ALT: 11 U/L (ref 0–44)
AST: 17 U/L (ref 15–41)
Albumin: 2.9 g/dL — ABNORMAL LOW (ref 3.5–5.0)
Alkaline Phosphatase: 126 U/L (ref 38–126)
Anion gap: 7 (ref 5–15)
BUN: 15 mg/dL (ref 8–23)
CO2: 28 mmol/L (ref 22–32)
Calcium: 8.3 mg/dL — ABNORMAL LOW (ref 8.9–10.3)
Chloride: 101 mmol/L (ref 98–111)
Creatinine, Ser: 1.04 mg/dL (ref 0.61–1.24)
GFR, Estimated: >60 mL/min (ref >60)
Glucose, Bld: 103 mg/dL — ABNORMAL HIGH (ref 70–99)
Potassium: 4.8 mmol/L (ref 3.5–5.1)
Sodium: 136 mmol/L (ref 135–145)
Total Bilirubin: 0.7 mg/dL (ref 0.0–1.2)
Total Protein: 6.7 g/dL (ref 6.5–8.1)

## 2023-09-01 LAB — CBC WITH DIFFERENTIAL/PLATELET
Abs Immature Granulocytes: 0.03 10*3/uL (ref 0.00–0.07)
Basophils Absolute: 0.1 10*3/uL (ref 0.0–0.1)
Basophils Relative: 1 %
Eosinophils Absolute: 0.2 10*3/uL (ref 0.0–0.5)
Eosinophils Relative: 3 %
HCT: 36.4 % — ABNORMAL LOW (ref 39.0–52.0)
Hemoglobin: 11.2 g/dL — ABNORMAL LOW (ref 13.0–17.0)
Immature Granulocytes: 0 %
Lymphocytes Relative: 24 %
Lymphs Abs: 2 10*3/uL (ref 0.7–4.0)
MCH: 28.6 pg (ref 26.0–34.0)
MCHC: 30.8 g/dL (ref 30.0–36.0)
MCV: 92.9 fL (ref 80.0–100.0)
Monocytes Absolute: 0.8 10*3/uL (ref 0.1–1.0)
Monocytes Relative: 9 %
Neutro Abs: 5.3 10*3/uL (ref 1.7–7.7)
Neutrophils Relative %: 63 %
Platelets: 225 10*3/uL (ref 150–400)
RBC: 3.92 MIL/uL — ABNORMAL LOW (ref 4.22–5.81)
RDW: 15.1 % (ref 11.5–15.5)
WBC: 8.4 10*3/uL (ref 4.0–10.5)
nRBC: 0 % (ref 0.0–0.2)

## 2023-09-01 LAB — TROPONIN I (HIGH SENSITIVITY)
Troponin I (High Sensitivity): 28 ng/L — ABNORMAL HIGH (ref <18)
Troponin I (High Sensitivity): 33 ng/L — ABNORMAL HIGH (ref <18)

## 2023-09-01 LAB — BRAIN NATRIURETIC PEPTIDE: B Natriuretic Peptide: 2867.3 pg/mL — ABNORMAL HIGH (ref 0.0–100.0)

## 2023-09-01 MED ORDER — DM-GUAIFENESIN ER 30-600 MG PO TB12
1.0000 | ORAL_TABLET | Freq: Two times a day (BID) | ORAL | Status: DC | PRN
  Administered 2023-09-03 (×2): 1 via ORAL
  Filled 2023-09-01 (×3): qty 1

## 2023-09-01 MED ORDER — NICOTINE 21 MG/24HR TD PT24
21.0000 mg | MEDICATED_PATCH | Freq: Every day | TRANSDERMAL | Status: DC
  Administered 2023-09-01 – 2023-09-03 (×3): 21 mg via TRANSDERMAL
  Filled 2023-09-01 (×3): qty 1

## 2023-09-01 MED ORDER — HYDRALAZINE HCL 20 MG/ML IJ SOLN: 5.0000 mg | INTRAMUSCULAR | Status: DC | PRN

## 2023-09-01 MED ORDER — FUROSEMIDE 10 MG/ML IJ SOLN
80.0000 mg | Freq: Once | INTRAMUSCULAR | Status: AC
  Administered 2023-09-01: 80 mg via INTRAVENOUS
  Filled 2023-09-01: qty 8

## 2023-09-01 MED ORDER — ACETAMINOPHEN 325 MG PO TABS: 650.0000 mg | ORAL_TABLET | Freq: Four times a day (QID) | ORAL | Status: DC | PRN

## 2023-09-01 MED ORDER — ONDANSETRON HCL 4 MG/2ML IJ SOLN: 4.0000 mg | Freq: Three times a day (TID) | INTRAMUSCULAR | Status: DC | PRN

## 2023-09-01 MED ORDER — ASPIRIN 81 MG PO TBEC
81.0000 mg | DELAYED_RELEASE_TABLET | Freq: Every day | ORAL | Status: DC
  Administered 2023-09-02 – 2023-09-03 (×2): 81 mg via ORAL
  Filled 2023-09-01 (×2): qty 1

## 2023-09-01 MED ORDER — ENOXAPARIN SODIUM 40 MG/0.4ML IJ SOSY
40.0000 mg | PREFILLED_SYRINGE | INTRAMUSCULAR | Status: DC
  Administered 2023-09-02: 40 mg via SUBCUTANEOUS
  Filled 2023-09-01: qty 0.4

## 2023-09-01 MED ORDER — ALBUTEROL SULFATE (2.5 MG/3ML) 0.083% IN NEBU
2.5000 mg | INHALATION_SOLUTION | RESPIRATORY_TRACT | Status: DC | PRN
  Administered 2023-09-01 – 2023-09-02 (×3): 2.5 mg via RESPIRATORY_TRACT
  Filled 2023-09-01 (×2): qty 3

## 2023-09-01 NOTE — ED Notes (Signed)
 Call light answered. Pt requesting an emesis bag as his previous bag had fallen. New bag handed to Pt. Call light in reach.

## 2023-09-01 NOTE — ED Triage Notes (Signed)
 Pt c/o SOB x2 weeks that has been worsening. Pt has been using his inhaler more than he normally has to. Pt is not oxygen dependent at home. Pt reports swelling in BLE but not worse than his normal. Pt reports his SOB is worse when he is up walking and says he can't sleep except for cat naps due to difficulty breathing.
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
## 2023-09-01 NOTE — ED Provider Notes (Signed)
 [facility] Provider Note    Event Date/Time   First MD Initiated Contact with Patient 09/01/23 2148     (approximate)   History   Chief Complaint Shortness of Breath   HPI  Richard Rivers is a 78 y.o. male with past medical history of hypertension who presents to the ED complaining of shortness of breath.  Patient reports that he has been feeling short of breath with exertion and when lying flat for about the past 2 weeks.  Symptoms have worsened to the point that he now feels short of breath at rest, but he denies any fevers, cough, or chest pain.  He has not noticed any pain or swelling in his legs, denies any history of similar symptoms.     Physical Exam   Triage Vital Signs: ED Triage Vitals [09/01/23 1803]  Encounter Vitals Group     BP (!) 150/92     Systolic BP Percentile      Diastolic BP Percentile      Pulse Rate 88     Resp 20     Temp 98.3 F (36.8 C)     Temp Source Oral     SpO2 92 %     Weight      Height      Head Circumference      Peak Flow      Pain Score 0     Pain Loc      Pain Education      Exclude from Growth Chart     Most recent vital signs: Vitals:   09/01/23 2016 09/01/23 2200  BP: (!) 153/80 (!) 169/92  Pulse: 82 97  Resp: 18 (!) 23  Temp: 98.4 F (36.9 C)   SpO2: 93% 96%    Constitutional: Alert and oriented. Eyes: Conjunctivae are normal. Head: Atraumatic. Nose: No congestion/rhinnorhea. Mouth/Throat: Mucous membranes are moist.  Cardiovascular: Normal rate, regular rhythm. Grossly normal heart sounds.  2+ radial pulses bilaterally. Respiratory: Normal respiratory effort.  No retractions.  Crackles to bilateral bases. Gastrointestinal: Soft and nontender. No distention. Musculoskeletal: No lower extremity tenderness, 2+ pitting edema to knees bilaterally. Neurologic:  Normal speech and language. No gross focal neurologic deficits are appreciated.    ED Results / Procedures / Treatments    Labs (all labs ordered are listed, but only abnormal results are displayed) Labs Reviewed  CBC WITH DIFFERENTIAL/PLATELET - Abnormal; Notable for the following components:      Result Value   RBC 3.92 (*)    Hemoglobin 11.2 (*)    HCT 36.4 (*)    All other components within normal limits  COMPREHENSIVE METABOLIC PANEL - Abnormal; Notable for the following components:   Glucose, Bld 103 (*)    Calcium  8.3 (*)    Albumin 2.9 (*)    All other components within normal limits  BRAIN NATRIURETIC PEPTIDE - Abnormal; Notable for the following components:   B Natriuretic Peptide 2,867.3 (*)    All other components within normal limits  TROPONIN I (HIGH SENSITIVITY) - Abnormal; Notable for the following components:   Troponin I (High Sensitivity) 28 (*)    All other components within normal limits  TROPONIN I (HIGH SENSITIVITY) - Abnormal; Notable for the following components:   Troponin I (High Sensitivity) 33 (*)    All other components within normal limits  RESP PANEL BY RT-PCR (RSV, FLU A&B, COVID)  RVPGX2  MAGNESIUM   HEMOGLOBIN A1C  LIPID PANEL  BASIC METABOLIC PANEL  CBC  TROPONIN I (HIGH SENSITIVITY)     EKG  ED ECG REPORT I, Carlin Palin, the attending physician, personally viewed and interpreted this ECG.   Date: 09/01/2023  EKG Time: 18:09  Rate: 86  Rhythm: normal sinus rhythm  Axis: LAD  Intervals:none  ST&T Change: Inferolateral T wave inversions and ST depressions  RADIOLOGY Chest x-ray reviewed and interpreted by me with cardiomegaly and bilateral pleural effusions.  PROCEDURES:  Critical Care performed: No  Procedures   MEDICATIONS ORDERED IN ED: Medications  albuterol  (PROVENTIL ) (2.5 MG/3ML) 0.083% nebulizer solution 2.5 mg (has no administration in time range)  dextromethorphan -guaiFENesin  (MUCINEX  DM) 30-600 MG per 12 hr tablet 1 tablet (has no administration in time range)  ondansetron  (ZOFRAN ) injection 4 mg (has no administration in time  range)  hydrALAZINE  (APRESOLINE ) injection 5 mg (has no administration in time range)  acetaminophen  (TYLENOL ) tablet 650 mg (has no administration in time range)  aspirin  EC tablet 81 mg (has no administration in time range)  nicotine  (NICODERM CQ  - dosed in mg/24 hours) patch 21 mg (21 mg Transdermal Patch Applied 09/01/23 2245)  enoxaparin  (LOVENOX ) injection 40 mg (has no administration in time range)  furosemide  (LASIX ) injection 80 mg (80 mg Intravenous Given 09/01/23 2244)     IMPRESSION / MDM / ASSESSMENT AND PLAN / ED COURSE  I reviewed the triage vital signs and the nursing notes.                              78 y.o. male with past medical history of hypertension who presents to the ED with shortness of breath with exertion and when lying flat for the past 2 weeks.  Patient's presentation is most consistent with acute presentation with potential threat to life or bodily function.  Differential diagnosis includes, but is not limited to, ACS, PE, pneumonia, CHF, COPD, anemia, electrolyte abnormality, AKI.  Patient nontoxic-appearing and in no acute distress, vital signs markable for mild tachypnea and oxygen saturations of 91 to 92% on room air.  Patient appears clinically fluid overloaded with lower extremity edema, chest x-ray with bilateral effusions and cardiomegaly.  Presentation concerning for new onset CHF and we will diurese with IV Lasix .  EKG with diffuse T wave and ST changes, troponin mildly elevated but stable on recheck, low suspicion for ACS.  BNP elevated consistent with CHF, no significant anemia, leukocytosis, electrolyte abnormality, or AKI noted.  Case discussed with hospitalist for admission for new onset CHF.      FINAL CLINICAL IMPRESSION(S) / ED DIAGNOSES   Final diagnoses:  Shortness of breath  New onset of congestive heart failure (HCC)     Rx / DC Orders   ED Discharge Orders     None        Note:  This document was prepared using Dragon  voice recognition software and may include unintentional dictation errors.   Palin Carlin, MD 09/01/23 [phone]

## 2023-09-01 NOTE — ED Triage Notes (Signed)
 Pt comes via [facility] from home with c/o cp for about two weeks only when he coughs. Pt states left sided cp. Pt does have hx of stents.   160/92 CBG-121   HR 91 95% on RA but more sob when ambulating.

## 2023-09-02 ENCOUNTER — Inpatient Hospital Stay: Payer: No Typology Code available for payment source

## 2023-09-02 ENCOUNTER — Encounter (INDEPENDENT_AMBULATORY_CARE_PROVIDER_SITE_OTHER): Payer: Self-pay | Admitting: Vascular Surgery

## 2023-09-02 ENCOUNTER — Encounter: Payer: Self-pay | Admitting: Internal Medicine

## 2023-09-02 DIAGNOSIS — I739 Peripheral vascular disease, unspecified: Secondary | ICD-10-CM | POA: Diagnosis present

## 2023-09-02 DIAGNOSIS — I509 Heart failure, unspecified: Secondary | ICD-10-CM

## 2023-09-02 DIAGNOSIS — G629 Polyneuropathy, unspecified: Secondary | ICD-10-CM

## 2023-09-02 DIAGNOSIS — F1020 Alcohol dependence, uncomplicated: Secondary | ICD-10-CM | POA: Diagnosis present

## 2023-09-02 DIAGNOSIS — J441 Chronic obstructive pulmonary disease with (acute) exacerbation: Secondary | ICD-10-CM | POA: Insufficient documentation

## 2023-09-02 DIAGNOSIS — S88919A 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, level unspecified, initial encounter: Secondary | ICD-10-CM | POA: Insufficient documentation

## 2023-09-02 LAB — RESPIRATORY PANEL BY PCR

## 2023-09-02 LAB — HEMOGLOBIN A1C
Hgb A1c MFr Bld: 6 % — ABNORMAL HIGH (ref 4.8–5.6)
Mean Plasma Glucose: 126 mg/dL

## 2023-09-02 LAB — CBC
HCT: 36.4 % — ABNORMAL LOW (ref 39.0–52.0)
Hemoglobin: 11.3 g/dL — ABNORMAL LOW (ref 13.0–17.0)
MCH: 28.6 pg (ref 26.0–34.0)
MCHC: 31 g/dL (ref 30.0–36.0)
MCV: 92.2 fL (ref 80.0–100.0)
Platelets: 221 10*3/uL (ref 150–400)
RBC: 3.95 MIL/uL — ABNORMAL LOW (ref 4.22–5.81)
RDW: 14.9 % (ref 11.5–15.5)
WBC: 10.3 10*3/uL (ref 4.0–10.5)
nRBC: 0 % (ref 0.0–0.2)

## 2023-09-02 LAB — LIPID PANEL
Cholesterol: 99 mg/dL (ref 0–200)
HDL: 43 mg/dL (ref >40)
LDL Cholesterol: 38 mg/dL (ref 0–99)
Total CHOL/HDL Ratio: 2.3 {ratio}
Triglycerides: 89 mg/dL (ref <150)
VLDL: 18 mg/dL (ref 0–40)

## 2023-09-02 LAB — BASIC METABOLIC PANEL
Anion gap: 9 (ref 5–15)
BUN: 14 mg/dL (ref 8–23)
CO2: 28 mmol/L (ref 22–32)
Calcium: 8.6 mg/dL — ABNORMAL LOW (ref 8.9–10.3)
Chloride: 98 mmol/L (ref 98–111)
Creatinine, Ser: 0.99 mg/dL (ref 0.61–1.24)
GFR, Estimated: >60 mL/min (ref >60)
Glucose, Bld: 101 mg/dL — ABNORMAL HIGH (ref 70–99)
Potassium: 4.3 mmol/L (ref 3.5–5.1)
Sodium: 135 mmol/L (ref 135–145)

## 2023-09-02 LAB — TROPONIN I (HIGH SENSITIVITY): Troponin I (High Sensitivity): 57 ng/L — ABNORMAL HIGH (ref <18)

## 2023-09-02 MED ORDER — BUSPIRONE HCL 10 MG PO TABS
10.0000 mg | ORAL_TABLET | Freq: Two times a day (BID) | ORAL | Status: DC
  Administered 2023-09-02 – 2023-09-03 (×3): 10 mg via ORAL
  Filled 2023-09-02: qty 1
  Filled 2023-09-02 (×2): qty 2

## 2023-09-02 MED ORDER — PREDNISONE 20 MG PO TABS
40.0000 mg | ORAL_TABLET | Freq: Every day | ORAL | Status: DC
Start: 2023-09-02 — End: 2023-09-03
  Administered 2023-09-02 – 2023-09-03 (×2): 40 mg via ORAL
  Filled 2023-09-02 (×2): qty 2

## 2023-09-02 MED ORDER — LISINOPRIL 10 MG PO TABS
40.0000 mg | ORAL_TABLET | Freq: Every day | ORAL | Status: DC
  Administered 2023-09-02: 40 mg via ORAL
  Filled 2023-09-02: qty 4

## 2023-09-02 MED ORDER — CARVEDILOL 12.5 MG PO TABS
12.5000 mg | ORAL_TABLET | Freq: Two times a day (BID) | ORAL | Status: DC
  Administered 2023-09-02 – 2023-09-03 (×4): 12.5 mg via ORAL
  Filled 2023-09-02: qty 1
  Filled 2023-09-02 (×2): qty 2
  Filled 2023-09-02: qty 1

## 2023-09-02 MED ORDER — FAMOTIDINE 20 MG PO TABS
20.0000 mg | ORAL_TABLET | Freq: Every day | ORAL | Status: DC
  Administered 2023-09-02 – 2023-09-03 (×2): 20 mg via ORAL
  Filled 2023-09-02 (×2): qty 1

## 2023-09-02 MED ORDER — LORAZEPAM 2 MG/ML IJ SOLN
1.0000 mg | Freq: Once | INTRAMUSCULAR | Status: AC | PRN
  Administered 2023-09-02: 1 mg via INTRAVENOUS
  Filled 2023-09-02: qty 1

## 2023-09-02 MED ORDER — ESCITALOPRAM OXALATE 10 MG PO TABS
10.0000 mg | ORAL_TABLET | Freq: Every day | ORAL | Status: DC
Start: 2023-09-02 — End: 2023-09-03
  Administered 2023-09-02 – 2023-09-03 (×2): 10 mg via ORAL
  Filled 2023-09-02 (×2): qty 1

## 2023-09-02 MED ORDER — HYDROXYZINE HCL 25 MG PO TABS: 25.0000 mg | ORAL_TABLET | Freq: Two times a day (BID) | ORAL | Status: DC | PRN

## 2023-09-02 MED ORDER — RIVAROXABAN 20 MG PO TABS
20.0000 mg | ORAL_TABLET | Freq: Every day | ORAL | Status: DC
Start: 2023-09-02 — End: 2023-09-03
  Administered 2023-09-02 – 2023-09-03 (×2): 20 mg via ORAL
  Filled 2023-09-02 (×2): qty 1

## 2023-09-02 MED ORDER — SODIUM CHLORIDE 0.9 % IV SOLN
1.0000 g | INTRAVENOUS | Status: DC
  Administered 2023-09-02 – 2023-09-03 (×2): 1 g via INTRAVENOUS
  Filled 2023-09-02 (×2): qty 10

## 2023-09-02 MED ORDER — FUROSEMIDE 10 MG/ML IJ SOLN
40.0000 mg | Freq: Two times a day (BID) | INTRAMUSCULAR | Status: DC
  Administered 2023-09-02 – 2023-09-03 (×3): 40 mg via INTRAVENOUS
  Filled 2023-09-02 (×3): qty 4

## 2023-09-02 MED ORDER — ATORVASTATIN CALCIUM 80 MG PO TABS
80.0000 mg | ORAL_TABLET | Freq: Every day | ORAL | Status: DC
  Administered 2023-09-02 (×2): 80 mg via ORAL
  Filled 2023-09-02 (×2): qty 4

## 2023-09-02 NOTE — Evaluation (Signed)
 Physical Therapy Evaluation Patient Details Name: Richard Rivers MRN: [id number] DOB: 1945/09/18 Today's Date: 09/02/2023  History of Present Illness  78 y/o male presented to ED on 09/01/23 for worsening SOB. Admitted for acute CHF exacerbation. PMH: CAD with stent placement, HTN, HLD, gout, depression, anxiety, tobacco abuse  Clinical Impression  Patient admitted with the above. PTA, patient lives with step son and friend and reports he ambulate with rollator in the home and step son assists with bathing and iADLs. Patient reports he has an aide 1x/week for 4 hours. Patient presents with weakness, impaired balance, and decreased activity tolerance. Required supervision for all mobility this date with RW. Found on 2L O2 with spO2 >96%, removed for mobility and spO2 drops as low as 87% on RA. Patient will benefit from skilled PT services during acute stay to address listed deficits. Patient will benefit from ongoing therapy at discharge to maximize functional independence and safety.         If plan is discharge home, recommend the following: A little help with walking and/or transfers;A little help with bathing/dressing/bathroom;Assistance with cooking/housework;Help with stairs or ramp for entrance   Can travel by private vehicle        Equipment Recommendations Rolling [name] (2 wheels)  Recommendations for Other Services       Functional Status Assessment Patient has had a recent decline in their functional status and demonstrates the ability to make significant improvements in function in a reasonable and predictable amount of time.     Precautions / Restrictions Precautions Precautions: Fall Precaution Comments: watch O2 Restrictions Weight Bearing Restrictions Per Provider Order: No      Mobility  Bed Mobility Overal bed mobility: Needs Assistance Bed Mobility: Supine to Sit     Supine to sit: Supervision          Transfers Overall transfer level: Needs  assistance Equipment used: Rolling [name] (2 wheels) Transfers: Sit to/from Stand Sit to Stand: Supervision                Ambulation/Gait Ambulation/Gait assistance: Supervision Gait Distance (Feet): 4 Feet Assistive device: Rolling [name] (2 wheels) Gait Pattern/deviations: Step-through pattern, Decreased stride length Gait velocity: decr     General Gait Details: rate of perceived exertion. 7/10. SpO2 on RA as low as 87%  Stairs            Wheelchair Mobility     Tilt Bed    Modified Rankin (Stroke Patients Only)       Balance Overall balance assessment: Needs assistance Sitting-balance support: Single extremity supported, Bilateral upper extremity supported, Feet supported Sitting balance-[name]: Good     Standing balance support: Bilateral upper extremity supported Standing balance-[name]: Fair                               Pertinent Vitals/Pain      Home Living Family/patient expects to be discharged to:: Private residence Living Arrangements: Other (Comment) (step son and friend) Available Help at Discharge: Family;Friend(s);Available 24 hours/day Type of Home: House Home Access: Ramped entrance       Home Layout: One level Home Equipment: Wheelchair - power;Rollator (4 wheels);Grab bars - toilet;Grab bars - tub/shower      Prior Function Prior Level of Function : Needs assist             Mobility Comments: uses rollator in the home and to get into the [name]. Uses power  chair in the community ADLs Comments: assist with bathing, cooking, cleaning. Able to get dressed on his own. Step son manages medications. Aide comes 4hrs/week to assist with bathing and IADL. Does not drive. No O2 at home.     Extremity/Trunk Assessment   Upper Extremity Assessment Upper Extremity Assessment: Defer to OT evaluation    Lower Extremity Assessment Lower Extremity Assessment: Generalized weakness    Cervical / Trunk  Assessment Cervical / Trunk Assessment: Normal  Communication   Communication Communication: No apparent difficulties  Cognition Arousal: Alert Behavior During Therapy: WFL for tasks assessed/performed Overall Cognitive Status: Within Functional Limits for tasks assessed                                          General Comments General comments (skin integrity, edema, etc.): SpO2 >96% on 2L at start of session, O2 removed. SpO2 90-91% on RA while seated EOB. SpO2 87-89% with exertion/mobility. Endorses 7/10 rate of perceived exertion. Nasal cannula replaced at end of session, SpO2 >96%.    Exercises     Assessment/Plan    PT Assessment Patient needs continued PT services  PT Problem List Decreased strength;Decreased balance;Decreased activity tolerance;Decreased mobility;Cardiopulmonary status limiting activity       PT Treatment Interventions Gait training;DME instruction;Functional mobility training;Therapeutic activities;Therapeutic exercise;Patient/family education    PT Goals (Current goals can be found in the Care Plan section)  Acute Rehab PT Goals Patient Stated Goal: to breathe better PT Goal Formulation: With patient Time For Goal Achievement: 09/16/23 Potential to Achieve Goals: Good    Frequency Min 1X/week     Co-evaluation PT/OT/SLP Co-Evaluation/Treatment: Yes Reason for Co-Treatment: To address functional/ADL transfers PT goals addressed during session: Mobility/safety with mobility OT goals addressed during session: ADL's and self-care       AM-PAC PT 6 Clicks Mobility  Outcome Measure Help needed turning from your back to your side while in a flat bed without using bedrails?: A Little Help needed moving from lying on your back to sitting on the side of a flat bed without using bedrails?: A Little Help needed moving to and from a bed to a chair (including a wheelchair)?: A Little Help needed standing up from a chair using your arms  (e.g., wheelchair or bedside chair)?: A Little Help needed to walk in hospital room?: A Little Help needed climbing 3-5 steps with a railing? : A Little 6 Click Score: 18    End of Session   Activity Tolerance: Patient limited by fatigue Patient left: in bed;with call bell/phone within reach Nurse Communication: Mobility status PT Visit Diagnosis: Unsteadiness on feet (R26.81);Muscle weakness (generalized) (M62.81);Other abnormalities of gait and mobility (R26.89)    Time: 8966-8944 PT Time Calculation (min) (ACUTE ONLY): 22 min   Charges:   PT Evaluation $PT Eval Moderate Complexity: 1 Mod   PT General Charges $$ ACUTE PT VISIT: 1 Visit         Maryanne Finder, PT, DPT Physical Therapist - [facility]  [facility]   Aleathia Purdy A Marieann Zipp 09/02/2023, 12:15 PM

## 2023-09-02 NOTE — Progress Notes (Addendum)
 PROGRESS NOTE    Richard Rivers  FMW:[id number] DOB: [birth year] DOA: 09/01/2023 PCP: System, Provider Not In  [facility]    Brief Narrative:   Richard Rivers is a 78 y.o. male with medical history significant of hypertension, hyperlipidemia, CAD with stent placement, gout, depression with anxiety, tobacco abuse, who presents with shortness breath.   Patient states that he has shortness of breath for more than 2 weeks, which has been progressively worsening.  He has cough with clear mucus production.  He had mild left-sided chest discomfort earlier, which has resolved.  I repeatedly asked patient if patient has chest pain, he denies active chest pain currently.  He has orthopnea, cannot laying flat to sleep.  Patient has 3-4 pounds of weight gain recently.  No nausea, vomiting, diarrhea or abdominal pain.  No symptoms of UTI.  No fever or chills.     Assessment & Plan:   Principal Problem:   Acute CHF (HCC) Active Problems:   Pleural effusion   CAD (coronary artery disease)   Myocardial injury   Hypertension   HLD (hyperlipidemia)   Gout   Depression with anxiety   PAD (peripheral artery disease) (HCC)   Neuropathy   # CHF with exacerbation Dyspneic, markedly elevated bnp, pleural effusions seen on cxr - continue lasix  40 iv bid - I/os - f/u tte - cont home coreg   # COPD With exacerbation. Covid/flu/rsv neg - start prednisone  - f/u CT chest - start ceftriaxone  - f/u rvp - sputum for culture  # Hypoxic respiratory failure? Started on o2 in the ER but no documented hypoxia and normal o2 when [place] discontinued today - monitor  # CAD S/p PCI 2023. No chest pain, minimal trop elevation, likely demand - continue xarelto , coreg   # PAD S/p fem-peroneal bypass 2023. No claudication symptoms - cont xarelto , statin  # HTN Normotensive - cont home coreg  - hold home lisinopril  while diuresing  # Tobacco abuse - patch  # Debility - PT  consult  # GAD - home lexapro , buspar      DVT prophylaxis: xarelto  Code Status: full Family Communication: step-son updated telephonically 1/2  Level of care: Telemetry Cardiac Status is: Inpatient Remains inpatient appropriate because: severity of illnes    Consultants:  none  Procedures: none  Antimicrobials:  ceftriaxone     Subjective: Ongoing cough and dyspnea  Objective: Vitals:   09/02/23 0228 09/02/23 0302 09/02/23 0700 09/02/23 0828  BP:  (!) 149/96 (!) 162/85 136/77  Pulse:  86 82 82  Resp:  20 18 20   Temp: 98.4 F (36.9 C) 98.4 F (36.9 C) 98.4 F (36.9 C)   TempSrc: Oral Oral Oral   SpO2:  97% 97% 97%  Weight:      Height:       No intake or output data in the 24 hours ending 09/02/23 0953 Filed Weights   09/01/23 2328  Weight: 74.1 kg    Examination:  General exam: Appears calm and comfortable  Respiratory system: scattered rhonchi Cardiovascular system: S1 & S2 heard, RRR. Distant heart sounds Gastrointestinal system: Abdomen is nondistended, soft and nontender.   Central nervous system: Alert and oriented. No focal neurological deficits. Extremities: right left bit more swollen than left chronic Skin: No rashes, lesions or ulcers Psychiatry: Judgement and insight appear normal. Mood & affect appropriate.     Data Reviewed: I have personally reviewed following labs and imaging studies  CBC: Recent Labs  Lab 09/01/23 1806 09/02/23 0617  WBC 8.4  10.3  NEUTROABS 5.3  --   HGB 11.2* 11.3*  HCT 36.4* 36.4*  MCV 92.9 92.2  PLT 225 221   Basic Metabolic Panel: Recent Labs  Lab 09/01/23 1806 09/02/23 0617  NA 136 135  K 4.8 4.3  CL 101 98  CO2 28 28  GLUCOSE 103* 101*  BUN 15 14  CREATININE 1.04 0.99  CALCIUM  8.3* 8.6*  MG 1.9  --    GFR: Estimated Creatinine Clearance: 64.5 mL/min (by C-G formula based on SCr of 0.99 mg/dL). Liver Function Tests: Recent Labs  Lab 09/01/23 1806  AST 17  ALT 11  ALKPHOS 126   BILITOT 0.7  PROT 6.7  ALBUMIN 2.9*   No results for input(s): LIPASE, AMYLASE in the last 168 hours. No results for input(s): AMMONIA in the last 168 hours. Coagulation Profile: No results for input(s): INR, PROTIME in the last 168 hours. Cardiac Enzymes: No results for input(s): CKTOTAL, CKMB, CKMBINDEX, TROPONINI in the last 168 hours. BNP (last 3 results) No results for input(s): PROBNP in the last 8760 hours. HbA1C: No results for input(s): HGBA1C in the last 72 hours. CBG: No results for input(s): GLUCAP in the last 168 hours. Lipid Profile: Recent Labs    09/02/23 0617  CHOL 99  HDL 43  LDLCALC 38  TRIG 89  CHOLHDL 2.3   Thyroid Function Tests: No results for input(s): TSH, T4TOTAL, FREET4, T3FREE, THYROIDAB in the last 72 hours. Anemia Panel: No results for input(s): VITAMINB12, FOLATE, FERRITIN, TIBC, IRON , RETICCTPCT in the last 72 hours. Urine analysis:    Component Value Date/Time   COLORURINE YELLOW 10/05/2009 1342   APPEARANCEUR CLOUDY (A) 10/05/2009 1342   LABSPEC 1.010 10/05/2009 1342   PHURINE 8.5 (H) 10/05/2009 1342   GLUCOSEU NEGATIVE 10/05/2009 1342   HGBUR NEGATIVE 10/05/2009 1342   BILIRUBINUR NEGATIVE 10/05/2009 1342   KETONESUR NEGATIVE 10/05/2009 1342   PROTEINUR NEGATIVE 10/05/2009 1342   UROBILINOGEN 1.0 10/05/2009 1342   NITRITE NEGATIVE 10/05/2009 1342   LEUKOCYTESUR  10/05/2009 1342    NEGATIVE MICROSCOPIC NOT DONE ON URINES WITH NEGATIVE PROTEIN, BLOOD, LEUKOCYTES, NITRITE, OR GLUCOSE <1000 mg/dL.   Sepsis Labs: @LABRCNTIP (procalcitonin:4,lacticidven:4)  ) Recent Results (from the past 240 hours)  Resp panel by RT-PCR (RSV, Flu A&B, Covid) Anterior Nasal Swab     Status: None   Collection Time: 09/01/23  6:06 PM   Specimen: Anterior Nasal Swab  Result Value Ref Range Status   SARS Coronavirus 2 by RT PCR NEGATIVE NEGATIVE Final    Comment: (NOTE) SARS-CoV-2 target nucleic acids are  NOT DETECTED.  The SARS-CoV-2 RNA is generally detectable in upper respiratory specimens during the acute phase of infection. The lowest concentration of SARS-CoV-2 viral copies this assay can detect is 138 copies/mL. A negative result does not preclude SARS-Cov-2 infection and should not be used as the sole basis for treatment or other patient management decisions. A negative result may occur with  improper specimen collection/handling, submission of specimen other than nasopharyngeal swab, presence of viral mutation(s) within the areas targeted by this assay, and inadequate number of viral copies(<138 copies/mL). A negative result must be combined with clinical observations, patient history, and epidemiological information. The expected result is Negative.  Fact Sheet for Patients:  bloggercourse.com  Fact Sheet for Healthcare Providers:  seriousbroker.it  This test is no t yet approved or cleared by the [place]  FDA and  has been authorized for detection and/or diagnosis of SARS-CoV-2 by FDA under an Emergency Use Authorization (  EUA). This EUA will remain  in effect (meaning this test can be used) for the duration of the COVID-19 declaration under Section 564(b)(1) of the Act, 21 U.S.C.section 360bbb-3(b)(1), unless the authorization is terminated  or revoked sooner.       Influenza A by PCR NEGATIVE NEGATIVE Final   Influenza B by PCR NEGATIVE NEGATIVE Final    Comment: (NOTE) The Xpert Xpress SARS-CoV-2/FLU/RSV plus assay is intended as an aid in the diagnosis of influenza from Nasopharyngeal swab specimens and should not be used as a sole basis for treatment. Nasal washings and aspirates are unacceptable for Xpert Xpress SARS-CoV-2/FLU/RSV testing.  Fact Sheet for Patients: bloggercourse.com  Fact Sheet for Healthcare Providers: seriousbroker.it  This test is not  yet approved or cleared by the [place]  FDA and has been authorized for detection and/or diagnosis of SARS-CoV-2 by FDA under an Emergency Use Authorization (EUA). This EUA will remain in effect (meaning this test can be used) for the duration of the COVID-19 declaration under Section 564(b)(1) of the Act, 21 U.S.C. section 360bbb-3(b)(1), unless the authorization is terminated or revoked.     Resp Syncytial Virus by PCR NEGATIVE NEGATIVE Final    Comment: (NOTE) Fact Sheet for Patients: bloggercourse.com  Fact Sheet for Healthcare Providers: seriousbroker.it  This test is not yet approved or cleared by the [place]  FDA and has been authorized for detection and/or diagnosis of SARS-CoV-2 by FDA under an Emergency Use Authorization (EUA). This EUA will remain in effect (meaning this test can be used) for the duration of the COVID-19 declaration under Section 564(b)(1) of the Act, 21 U.S.C. section 360bbb-3(b)(1), unless the authorization is terminated or revoked.  Performed at [facility], [address] [place], [place] [address]          Radiology Studies: DG Chest 2 View Result Date: 09/01/2023 CLINICAL DATA:  SOB EXAM: CHEST - 2 VIEW COMPARISON:  11/05/2014 FINDINGS: New moderate right-sided and small left-sided bilateral pleural effusions. Dependent atelectasis above the effusions in the lung bases. No pneumothorax. Calcified aorta. Cardiac silhouette prominent. Osseous structures are intact. IMPRESSION: Bilateral pleural effusions. Enlarged cardiac silhouette. Mild bibasilar consolidation or volume loss. Electronically Signed   By: Fonda Field M.D.   On: 09/01/2023 19:11        Scheduled Meds:  aspirin  EC  81 mg Oral Daily   atorvastatin   80 mg Oral QHS   busPIRone   10 mg Oral BID   carvedilol   12.5 mg Oral BID WC   enoxaparin  (LOVENOX ) injection  40 mg Subcutaneous Q24H   famotidine   20  mg Oral Daily   furosemide   40 mg Intravenous BID   nicotine   21 mg Transdermal Daily   predniSONE   40 mg Oral Q breakfast   Continuous Infusions:  cefTRIAXone  (ROCEPHIN )  IV       LOS: 1 day     Devaughn KATHEE Ban, MD [facility]   If 7PM-7AM, please contact night-coverage [email or web address] Password [email or web address] 09/02/2023, 9:53 AM

## 2023-09-02 NOTE — Evaluation (Signed)
 Occupational Therapy Evaluation Patient Details Name: Richard Rivers MRN: [id number] DOB: May 14, 1946 Today's Date: 09/02/2023   History of Present Illness 78 y/o male presented to ED on 09/01/23 for worsening SOB. Admitted for acute CHF exacerbation. PMH: CAD with stent placement, HTN, HLD, gout, depression, anxiety, tobacco abuse   Clinical Impression   Pt was seen for OT evaluation this date. Pt is a pleasant retired former [employer] who served in [place]. Prior to hospital admission, pt was ambulating with a rollator in the home and using a power w/c out in the community for longer distances. His step son assists him with seated bathing, medication mgt, and IADL including driving. He reports an aide comes once per week for 4 hours to assist with bathing and IADL. No home O2 at baseline. Pt endorses a couple falls in past 32mo. Pt presents to acute OT demonstrating impaired ADL performance and functional mobility 2/2 cardiopulmonary status and impaired activity tolerance and mild balance deficits (See OT problem list for additional functional deficits). Pt currently requires 2L O2 to maintain SpO2 >90% (desats as low as 87% on RA with mobility). Pt currently requiring increased assist for ADL and mobility. Pt endorses 7/10 rate of perceived exertion with limited mobility efforts. Pt would benefit from skilled OT services to address noted impairments and functional limitations (see below for any additional details) in order to maximize safety and independence while minimizing falls risk and caregiver burden.     If plan is discharge home, recommend the following: A little help with walking and/or transfers;A little help with bathing/dressing/bathroom;Assistance with cooking/housework;Assist for transportation;Help with stairs or ramp for entrance    Functional Status Assessment  Patient has had a recent decline in their functional status and demonstrates the ability to make significant improvements in  function in a reasonable and predictable amount of time.  Equipment Recommendations  Tub/shower seat    Recommendations for Other Services       Precautions / Restrictions Precautions Precautions: Fall Precaution Comments: watch O2 Restrictions Weight Bearing Restrictions Per Provider Order: No      Mobility Bed Mobility Overal bed mobility: Needs Assistance Bed Mobility: Supine to Sit     Supine to sit: Supervision          Transfers Overall transfer level: Needs assistance Equipment used: Rolling walker (2 wheels) Transfers: Sit to/from Stand Sit to Stand: Supervision                  Balance Overall balance assessment: Needs assistance Sitting-balance support: Single extremity supported, Bilateral upper extremity supported, Feet supported Sitting balance-[name]: Good     Standing balance support: Bilateral upper extremity supported Standing balance-[name]: Fair                             ADL either performed or assessed with clinical judgement   ADL Overall ADL's : Needs assistance/impaired             Lower Body Bathing: Sitting/lateral leans;Minimal assistance Lower Body Bathing Details (indicate cue type and reason): Anticipate MIN A + increased time/effort       Lower Body Dressing Details (indicate cue type and reason): Anticipate MIN A + increased time/effort Toilet Transfer: Supervision/safety;BSC/3in1;Rolling walker (2 wheels);Ambulation Toilet Transfer Details (indicate cue type and reason): Anticipate supv for short distance with RW, becomes SOB easily         Functional mobility during ADLs: Supervision/safety;Rolling walker (2 wheels)  Vision         Perception         Praxis         Pertinent Vitals/Pain Pain Assessment Pain Assessment: No/denies pain     Extremity/Trunk Assessment Upper Extremity Assessment Upper Extremity Assessment: Overall WFL for tasks assessed   Lower  Extremity Assessment Lower Extremity Assessment: Defer to PT evaluation   Cervical / Trunk Assessment Cervical / Trunk Assessment: Normal   Communication Communication Communication: No apparent difficulties   Cognition Arousal: Alert Behavior During Therapy: WFL for tasks assessed/performed Overall Cognitive Status: Within Functional Limits for tasks assessed                                       General Comments  SpO2 >96% on 2L at start of session, O2 removed. SpO2 90-91% on RA while seated EOB. SpO2 87-89% with exertion/mobility. Endorses 7/10 rate of perceived exertion. Nasal cannula replaced at end of session, SpO2 >96%.    Exercises Other Exercises Other Exercises: Pt educated in shower chair to support energy conservation with bathing.   Shoulder Instructions      Home Living Family/patient expects to be discharged to:: Private residence Living Arrangements: Other (Comment) (step son and friend) Available Help at Discharge: Family;Friend(s);Available 24 hours/day Type of Home: House Home Access: Ramped entrance     Home Layout: One level     Bathroom Shower/Tub: Producer, Television/film/video: Handicapped height     Home Equipment: Wheelchair - power;Rollator (4 wheels);Grab bars - toilet;Grab bars - tub/shower          Prior Functioning/Environment Prior Level of Function : Needs assist             Mobility Comments: uses rollator in the home and to get into the [place]. Uses power chair in the community ADLs Comments: assist with bathing, cooking, cleaning. Able to get dressed on his own. Step son manages medications. Aide comes 4hrs/week to assist with bathing and IADL. Does not drive. No O2 at home.        OT Problem List: Cardiopulmonary status limiting activity;Decreased activity tolerance;Impaired balance (sitting and/or standing);Decreased knowledge of use of DME or AE      OT Treatment/Interventions: Self-care/ADL  training;Therapeutic exercise;Therapeutic activities;Energy conservation;DME and/or AE instruction;Patient/family education;Balance training    OT Goals(Current goals can be found in the care plan section) Acute Rehab OT Goals Patient Stated Goal: get better and go home OT Goal Formulation: With patient Time For Goal Achievement: 09/16/23 Potential to Achieve Goals: Good ADL Goals Pt Will Perform Lower Body Dressing: with modified independence Pt Will Transfer to Toilet: with modified independence;ambulating (elevated commode, LRAD) Pt Will Perform Toileting - Clothing Manipulation and hygiene: with modified independence Additional ADL Goal #1: Pt will verbalize plan to implement at least 2 learned ECS/falls prevention strategies to maximize safety/indep with ADL.  OT Frequency: Min 1X/week    Co-evaluation PT/OT/SLP Co-Evaluation/Treatment: Yes Reason for Co-Treatment: To address functional/ADL transfers PT goals addressed during session: Mobility/safety with mobility OT goals addressed during session: ADL's and self-care      AM-PAC OT 6 Clicks Daily Activity     Outcome Measure Help from another person eating meals?: None Help from another person taking care of personal grooming?: None Help from another person toileting, which includes using toliet, bedpan, or urinal?: A Little Help from another person bathing (including washing, rinsing, drying)?: A  Little Help from another person to put on and taking off regular upper body clothing?: None Help from another person to put on and taking off regular lower body clothing?: A Little 6 Click Score: 21   End of Session Equipment Utilized During Treatment: Oxygen;Rolling walker (2 wheels) Nurse Communication: Mobility status  Activity Tolerance: Patient tolerated treatment well Patient left: in bed;with call bell/phone within reach (seated EOB per pt's request)  OT Visit Diagnosis: Other abnormalities of gait and mobility  (R26.89);History of falling (Z91.81)                Time: 8966-8944 OT Time Calculation (min): 22 min Charges:  OT General Charges $OT Visit: 1 Visit OT Evaluation $OT Eval Low Complexity: 1 Low  Warren SAUNDERS., MPH, MS, OTR/L ascom [phone] 09/02/23, 11:22 AM

## 2023-09-03 ENCOUNTER — Encounter: Payer: Self-pay | Admitting: Internal Medicine

## 2023-09-03 ENCOUNTER — Inpatient Hospital Stay
Admit: 2023-09-03 | Discharge: 2023-09-03 | Disposition: A | Discharge status: Home / Self Care | Payer: No Typology Code available for payment source | Attending: Internal Medicine | Admitting: Internal Medicine

## 2023-09-03 ENCOUNTER — Other Ambulatory Visit: Payer: No Typology Code available for payment source

## 2023-09-03 DIAGNOSIS — I272 Pulmonary hypertension, unspecified: Secondary | ICD-10-CM | POA: Diagnosis present

## 2023-09-03 DIAGNOSIS — I5020 Unspecified systolic (congestive) heart failure: Secondary | ICD-10-CM

## 2023-09-03 LAB — BASIC METABOLIC PANEL
Anion gap: 10 (ref 5–15)
BUN: 18 mg/dL (ref 8–23)
CO2: 28 mmol/L (ref 22–32)
Calcium: 9 mg/dL (ref 8.9–10.3)
Chloride: 95 mmol/L — ABNORMAL LOW (ref 98–111)
Creatinine, Ser: 1.12 mg/dL (ref 0.61–1.24)
GFR, Estimated: >60 mL/min (ref >60)
Glucose, Bld: 106 mg/dL — ABNORMAL HIGH (ref 70–99)
Potassium: 4.1 mmol/L (ref 3.5–5.1)
Sodium: 133 mmol/L — ABNORMAL LOW (ref 135–145)

## 2023-09-03 LAB — ECHOCARDIOGRAM COMPLETE
AR max vel: 2.35 cm2
AV Area VTI: 2.17 cm2
AV Area mean vel: 2.03 cm2
AV Mean grad: 2 mm[Hg]
AV Peak grad: 5.2 mm[Hg]
Ao pk vel: 1.14 m/s
Area-P 1/2: 5.09 cm2
Height: 72 in
MV VTI: 1.88 cm2
S' Lateral: 4.3 cm
Weight: 2323.2 [oz_av]

## 2023-09-03 LAB — MAGNESIUM: Magnesium: 1.5 mg/dL — ABNORMAL LOW (ref 1.7–2.4)

## 2023-09-03 MED ORDER — MAGNESIUM SULFATE 2 GM/50ML IV SOLN
2.0000 g | Freq: Once | INTRAVENOUS | Status: DC
  Filled 2023-09-03: qty 50

## 2023-09-03 MED ORDER — PREDNISONE 20 MG PO TABS: 40.0000 mg | ORAL_TABLET | Freq: Every day | ORAL | 0 refills | Status: AC

## 2023-09-03 MED ORDER — FUROSEMIDE 20 MG PO TABS: 20.0000 mg | ORAL_TABLET | Freq: Every day | ORAL | 11 refills | Status: DC

## 2023-09-03 MED ORDER — PERFLUTREN LIPID MICROSPHERE
1.0000 mL | INTRAVENOUS | Status: AC | PRN
  Administered 2023-09-03: 8 mL via INTRAVENOUS

## 2023-09-03 MED ORDER — MAGNESIUM OXIDE -MG SUPPLEMENT 400 (240 MG) MG PO TABS
800.0000 mg | ORAL_TABLET | Freq: Once | ORAL | Status: AC
  Administered 2023-09-03: 800 mg via ORAL
  Filled 2023-09-03: qty 2

## 2023-09-03 NOTE — Progress Notes (Signed)
 Occupational Therapy Treatment Patient Details Name: Richard Rivers MRN: [id number] DOB: 1946/06/28 Today's Date: 09/03/2023   History of present illness 78 y/o male presented to ED on 09/01/23 for worsening SOB. Admitted for acute CHF exacerbation. PMH: CAD with stent placement, HTN, HLD, gout, depression, anxiety, tobacco abuse   OT comments  Pt seen for OT tx. Pt seated EOB, son present. Pt endorses feeling slightly better today. Pt ambulated to the bathroom and back to the EOB with RW requiring supv and demonstrating fair balance while standing at toilet to urinate without UE support. On 2L Pt desats from >94% at rest to 90-91% on 2L with exertion. VC for PLB. Pt endorses brief toileting task and mobility was 4/10 perceived rate of exertion, an improvement upon yesterday's score for less exertional activity. Pt/son educated further in activity pacing and ECS as well as instructed pt in use of incentive spirometer and pt able to return demo proper technique. Pt progressing towards goals.       If plan is discharge home, recommend the following:  A little help with walking and/or transfers;A little help with bathing/dressing/bathroom;Assistance with cooking/housework;Assist for transportation;Help with stairs or ramp for entrance   Equipment Recommendations  Tub/shower seat    Recommendations for Other Services      Precautions / Restrictions Precautions Precautions: Fall Precaution Comments: watch O2 Restrictions Weight Bearing Restrictions Per Provider Order: No       Mobility Bed Mobility               General bed mobility comments: NT, seated EOB at start and end of session    Transfers Overall transfer level: Needs assistance Equipment used: Rolling walker (2 wheels) Transfers: Sit to/from Stand Sit to Stand: Supervision                 Balance Overall balance assessment: Needs assistance Sitting-balance support: Single extremity supported, Bilateral upper  extremity supported, Feet supported Sitting balance-[name]: Good     Standing balance support: No upper extremity supported, During functional activity Standing balance-[name]: Fair Standing balance comment: initial UE support on the wall behind toilet improving to no UE support and no overt LOB to urinate in standing within RW frame at toilet                           ADL either performed or assessed with clinical judgement   ADL Overall ADL's : Needs assistance/impaired                         Toilet Transfer: Supervision/safety;Rolling walker (2 wheels);Ambulation;Regular Teacher, Adult Education Details (indicate cue type and reason): Pt tolerated standing at the toilet to urinate with no UE support and supv for balance Toileting- Clothing Manipulation and Hygiene: Modified independent       Functional mobility during ADLs: Supervision/safety;Rolling walker (2 wheels) General ADL Comments: SBA for ADL mobility using RW, slow, VC for PLB    Extremity/Trunk Assessment              Vision       Perception     Praxis      Cognition Arousal: Alert Behavior During Therapy: WFL for tasks assessed/performed Overall Cognitive Status: Within Functional Limits for tasks assessed  Exercises Other Exercises Other Exercises: Pt/son educated in ECS and activity pacing, IS use    Shoulder Instructions       General Comments SpO2 >94% on 2L at rest, desats to 90-91% on 2L with mobility/exertional ADL.    Pertinent Vitals/ Pain       Pain Assessment Pain Assessment: No/denies pain  Home Living                                          Prior Functioning/Environment              Frequency  Min 1X/week        Progress Toward Goals  OT Goals(current goals can now be found in the care plan section)  Progress towards OT goals: Progressing toward  goals  Acute Rehab OT Goals Patient Stated Goal: get better and go home OT Goal Formulation: With patient Time For Goal Achievement: 09/16/23 Potential to Achieve Goals: Good  Plan      Co-evaluation                 AM-PAC OT 6 Clicks Daily Activity     Outcome Measure   Help from another person eating meals?: None Help from another person taking care of personal grooming?: None Help from another person toileting, which includes using toliet, bedpan, or urinal?: A Little Help from another person bathing (including washing, rinsing, drying)?: A Little Help from another person to put on and taking off regular upper body clothing?: None Help from another person to put on and taking off regular lower body clothing?: A Little 6 Click Score: 21    End of Session Equipment Utilized During Treatment: Oxygen  OT Visit Diagnosis: Other abnormalities of gait and mobility (R26.89);History of falling (Z91.81)   Activity Tolerance Patient tolerated treatment well   Patient Left in bed;with call bell/phone within reach;with family/visitor present (seated EOB)   Nurse Communication          Time: 9084-9069 OT Time Calculation (min): 15 min  Charges: OT General Charges $OT Visit: 1 Visit OT Treatments $Self Care/Home Management : 8-22 mins  Warren SAUNDERS., MPH, MS, OTR/L ascom [phone] 09/03/23, 10:27 AM

## 2023-09-03 NOTE — Progress Notes (Signed)
 PT Cancellation Note  Patient Details Name: Richard Rivers MRN: [id number] DOB: 28-Nov-1945   Cancelled Treatment:    Reason Eval/Treat Not Completed: Other (comment): Chart Reviewed. Initial attempt at 11:40 am, patient requesting therapist to come back after lunch due to just received breathing treatment. PT in agreement.  This author completed a re-attempt at 1:18pm. Upon arrival, another medical provider present and assessing patient.  Will re-attempt at later time/date.    Kashius Dominic M Fairly, PT, DPT 09/03/23 1:18 PM

## 2023-09-03 NOTE — Progress Notes (Signed)
   09/03/23 1200  Spiritual Encounters  Type of Visit Initial  Care provided to: Patient  Referral source Patient request  Reason for visit Advance directives  OnCall Visit Yes   Chaplain received a spiritual consult for an AD. When I arrived the patient was sleeping. Will try to check back later with the patient.

## 2023-09-03 NOTE — Discharge Summary (Signed)
 DRAYCEN LEICHTER FMW:[id number] DOB: 02-Sep-1945 DOA: 09/01/2023  PCP: System, Provider Not In  Admit date: 09/01/2023 Discharge date: 09/03/2023  Time spent: 35 minutes  Recommendations for Outpatient Follow-up:  [facility] with [facility]ologist Bmp within a week or so     Discharge Diagnoses:  Principal Problem:   HFrEF (heart failure with reduced ejection fraction) (HCC) Active Problems:   Pleural effusion   CAD (coronary artery disease)   Myocardial injury   Hypertension   HLD (hyperlipidemia)   Gout   Depression with anxiety   PAD (peripheral artery disease) (HCC)   Neuropathy   COPD with acute exacerbation (HCC)   Pulmonary hypertension (HCC)   Discharge Condition: improved  Diet recommendation: heart healthy  Filed Weights   09/01/23 2328 09/02/23 2318 09/03/23 1054  Weight: 74.1 kg 70.1 kg 65.9 kg    History of present illness:  DALANTE MINUS is a 78 y.o. male with medical history significant of hypertension, hyperlipidemia, CAD with stent placement, gout, depression with anxiety, tobacco abuse, who presents with shortness breath.   Patient states that he has shortness of breath for more than 2 weeks, which has been progressively worsening.  He has cough with clear mucus production.  He had mild left-sided chest discomfort earlier, which has resolved.  I repeatedly asked patient if patient has chest pain, he denies active chest pain currently.  He has orthopnea, cannot laying flat to sleep.  Patient has 3-4 pounds of weight gain recently.  No nausea, vomiting, diarrhea or abdominal pain.  No symptoms of UTI.  No fever or chills.  Hospital Course:  Patient presents with cough and shortness of breath. Infectious w/u negative but patient was treated with steroids and copd for copd exacerbation. With hypoxic respiratory failure initially requiring 2 liters. Bnp ws markedly elevated  and patient complained of orthopnea so patient was also treated for chf exacerbation  with IV lasix . I/Os not accurately recorded but patient reports vigorous diuresis. Evaluated by PT and deemed suitable for discharge with home health. Weaned off oxygen. TTE shows what appears to be new systolic dysfunction with ef 35-40, grade 2 dd, low normal rv function, severe pulmonary hypertension, mild mr, mild to mod tr, and concern for possible infiltrative process. Patient requests discharge home. Discussed w/ Dr. Florencio of cardiology, given response to treatment does not need further inpatient evaluation but does need close outpatient cardiology follow-up. Patient has a [place] cardiologist and prefers to follow up there, we advised that if he can't get seen by his cardiologist promptly (within the next couple of weeks) should arrange prompt follow-up with his PCP. Dr. Florencio can also see the patient for prompt follow-up and patient is aware of that. Suspect pulmonary hypertension is secondary to his COPD, consider pulmonary follow up for that. Will discharge home to complete a course of prednisone  and with new prescription for lasix .   Procedures: none   Consultations: Consultation with cardiology as above  Discharge Exam: Vitals:   09/03/23 1600 09/03/23 1619  BP:  (!) 144/84  Pulse:  78  Resp:    Temp:  97.7 F (36.5 C)  SpO2: 93% 92%    General: NAD Cardiovascular: rrr, soft systolic murmur Respiratory: rales at bases, otherwise clear  Discharge Instructions   Discharge Instructions     Diet - low sodium heart healthy   Complete by: As directed    Increase activity slowly   Complete by: As directed       Allergies  as of 09/03/2023       Reactions   Codeine Itching, Swelling   Throat swelling   Penicillin G Swelling   Throat        Medication List     STOP taking these medications    colchicine  0.6 MG tablet   divalproex 500 MG DR tablet Commonly known as: DEPAKOTE       TAKE these medications    acetaminophen  325 MG tablet Commonly known as:  TYLENOL  Take 650 mg by mouth 3 (three) times daily as needed.   allopurinol  100 MG tablet Commonly known as: ZYLOPRIM  Take 300 mg by mouth daily.   ammonium lactate 12 % lotion Commonly known as: LAC-HYDRIN Apply 1 Application topically 2 (two) times daily as needed. APPLY TO DRY SKIN ON FEET   aspirin  EC 81 MG tablet Take by mouth.   atorvastatin  80 MG tablet Commonly known as: LIPITOR  Take 80 mg by mouth at bedtime.   busPIRone  10 MG tablet Commonly known as: BUSPAR  Take 10 mg by mouth 2 (two) times daily as needed.   capsaicin 0.025 % cream Commonly known as: ZOSTRIX Apply topically.   carvedilol  12.5 MG tablet Commonly known as: COREG  Take 12.5 mg by mouth 2 (two) times daily with a meal.   cetirizine 10 MG tablet Commonly known as: ZYRTEC Take by mouth.   Combivent  Respimat 20-100 MCG/ACT Aers respimat Generic drug: Ipratropium-Albuterol  Inhale 1 puff into the lungs 4 (four) times daily.   docusate sodium  50 MG capsule Commonly known as: COLACE Take 50 mg by mouth daily as needed for mild constipation. Per son, patient has this medication but doesn't use often   ergocalciferol 1.25 MG (50000 UT) capsule Commonly known as: VITAMIN D2 Take 50,000 Units by mouth once a week.   escitalopram  10 MG tablet Commonly known as: LEXAPRO  Take 10 mg by mouth daily. Take one tablet daily   famotidine  20 MG tablet Commonly known as: PEPCID  Take 20 mg by mouth daily.   furosemide  20 MG tablet Commonly known as: Lasix  Take 1 tablet (20 mg total) by mouth daily.   gabapentin  300 MG capsule Commonly known as: NEURONTIN  Take by mouth.   guaiFENesin  600 MG 12 hr tablet Commonly known as: MUCINEX  Take 600 mg by mouth every 12 (twelve) hours as needed for cough.   hydrOXYzine  25 MG tablet Commonly known as: ATARAX  Take 25 mg by mouth 2 (two) times daily as needed for anxiety.   lisinopril  40 MG tablet Commonly known as: ZESTRIL  Take 1 tablet by mouth daily.    predniSONE  20 MG tablet Commonly known as: DELTASONE  Take 2 tablets (40 mg total) by mouth daily with breakfast for 3 days. Start taking on: September 04, 2023   rivaroxaban  20 MG Tabs tablet Commonly known as: XARELTO  Take 20 mg by mouth daily with supper. Take one tablet daily with supper   senna-docusate 8.6-50 MG tablet Commonly known as: Senokot-S Take 1 tablet by mouth daily.   traZODone  50 MG tablet Commonly known as: DESYREL  Take 50 mg by mouth at bedtime. Take 2 tablets at night       Allergies  Allergen Reactions   Codeine Itching and Swelling    Throat swelling   Penicillin G Swelling    Throat    Follow-up Information     [facility] and cardiologist Follow up.          Florencio Cara BIRCH, MD Follow up.   Specialties: Cardiology, Internal  Medicine Why: you can follow up with this local cardiologist if you choose Contact information: [address] [place] [address] [phone]                  The results of significant diagnostics from this hospitalization (including imaging, microbiology, ancillary and laboratory) are listed below for reference.    Significant Diagnostic Studies: ECHOCARDIOGRAM COMPLETE Result Date: 09/03/2023    ECHOCARDIOGRAM REPORT   Patient Name:   GLENDEN ROSSELL Date of Exam: 09/03/2023 Medical Rec #:  [id number]        Height:       72.0 in Accession #:    [id number]       Weight:       145.2 lb Date of Birth:  04/13/1946        BSA:          1.859 m Patient Age:    72 years         BP:           141/79 mmHg Patient Gender: M                HR:           91 bpm. Exam Location:  [facility] Procedure: 2D Echo, Cardiac Doppler, Color Doppler and Intracardiac            Opacification Agent Indications:     CHF  History:         Patient has prior history of Echocardiogram examinations, most                  recent 10/07/2009. CHF, CAD, PAD, COPD and Stroke; Risk                  Factors:Hypertension,  Dyslipidemia and Current Smoker. Pleural                  effusion.  Sonographer:     Naomie Reef Referring Phys:  [id number] XILIN NIU Diagnosing Phys: Cara JONETTA Lovelace MD  Sonographer Comments: Technically difficult study due to poor echo windows. Image acquisition challenging due to COPD and Image acquisition challenging due to respiratory motion. IMPRESSIONS  1. Consider Infiltrative process.  2. Pul HTN.  3. Left ventricular ejection fraction, by estimation, is 35 to 40%. The left ventricle has moderately decreased function. The left ventricle demonstrates global hypokinesis. The left ventricular internal cavity size was mildly dilated. Left ventricular diastolic parameters are consistent with Grade II diastolic dysfunction (pseudonormalization).  4. Right ventricular systolic function is low normal. The right ventricular size is mildly enlarged. Mildly increased right ventricular wall thickness. There is severely elevated pulmonary artery systolic pressure.  5. Left atrial size was mild to moderately dilated.  6. Right atrial size was mild to moderately dilated.  7. The mitral valve is grossly normal. Mild mitral valve regurgitation.  8. Tricuspid valve regurgitation is mild to moderate.  9. The aortic valve is grossly normal. Aortic valve regurgitation is trivial. FINDINGS  Left Ventricle: Left ventricular ejection fraction, by estimation, is 35 to 40%. The left ventricle has moderately decreased function. The left ventricle demonstrates global hypokinesis. Definity  contrast agent was given IV to delineate the left ventricular endocardial borders. The left ventricular internal cavity size was mildly dilated. There is borderline concentric left ventricular hypertrophy. Left ventricular diastolic parameters are consistent with Grade II diastolic dysfunction (pseudonormalization). Right Ventricle: The right ventricular size is mildly enlarged. Mildly increased right ventricular wall thickness. Right  ventricular  systolic function is low normal. There is severely elevated pulmonary artery systolic pressure. The tricuspid regurgitant  velocity is 4.14 m/s, and with an assumed right atrial pressure of 15 mmHg, the estimated right ventricular systolic pressure is 83.6 mmHg. Left Atrium: Left atrial size was mild to moderately dilated. Right Atrium: Right atrial size was mild to moderately dilated. Pericardium: Trivial pericardial effusion is present. Mitral Valve: The mitral valve is grossly normal. Mild mitral valve regurgitation. MV peak gradient, 5.1 mmHg. The mean mitral valve gradient is 2.0 mmHg. Tricuspid Valve: The tricuspid valve is grossly normal. Tricuspid valve regurgitation is mild to moderate. Aortic Valve: The aortic valve is grossly normal. Aortic valve regurgitation is trivial. Aortic valve mean gradient measures 2.0 mmHg. Aortic valve peak gradient measures 5.2 mmHg. Aortic valve area, by VTI measures 2.17 cm. Pulmonic Valve: The pulmonic valve was normal in structure. Pulmonic valve regurgitation is trivial. Aorta: The ascending aorta was not well visualized. IAS/Shunts: No atrial level shunt detected by color flow Doppler. Additional Comments: Consider Infiltrative process. Pul HTN.  LEFT VENTRICLE PLAX 2D LVIDd:         5.30 cm   Diastology LVIDs:         4.30 cm   LV e' lateral:   7.18 cm/s LV PW:         1.10 cm   LV E/e' lateral: 15.9 LV IVS:        1.20 cm LVOT diam:     2.00 cm LV SV:         49 LV SV Index:   26 LVOT Area:     3.14 cm  RIGHT VENTRICLE RV Basal diam:  3.25 cm RV Mid diam:    3.10 cm RV S prime:     11.50 cm/s TAPSE (M-mode): 2.2 cm LEFT ATRIUM              Index        RIGHT ATRIUM           Index LA diam:        4.50 cm  2.42 cm/m   RA Area:     17.60 cm LA Vol (A2C):   103.0 ml 55.39 ml/m  RA Volume:   49.90 ml  26.84 ml/m LA Vol (A4C):   87.3 ml  46.95 ml/m LA Biplane Vol: 97.6 ml  52.49 ml/m  AORTIC VALVE                    PULMONIC VALVE AV Area (Vmax):    2.35 cm     PV  Vmax:       0.80 m/s AV Area (Vmean):   2.03 cm     PV Peak grad:  2.6 mmHg AV Area (VTI):     2.17 cm AV Vmax:           114.00 cm/s AV Vmean:          71.700 cm/s AV VTI:            0.226 m AV Peak Grad:      5.2 mmHg AV Mean Grad:      2.0 mmHg LVOT Vmax:         85.40 cm/s LVOT Vmean:        46.300 cm/s LVOT VTI:          0.156 m LVOT/AV VTI ratio: 0.69  AORTA Ao Root diam: 3.50 cm MITRAL VALVE  TRICUSPID VALVE MV Area (PHT): 5.09 cm     TR Peak grad:   68.6 mmHg MV Area VTI:   1.88 cm     TR Vmax:        414.00 cm/s MV Peak grad:  5.1 mmHg MV Mean grad:  2.0 mmHg     SHUNTS MV Vmax:       1.13 m/s     Systemic VTI:  0.16 m MV Vmean:      60.2 cm/s    Systemic Diam: 2.00 cm MV Decel Time: 149 msec MV E velocity: 114.00 cm/s MV A velocity: 77.00 cm/s MV E/A ratio:  1.48 Dwayne D Callwood MD Electronically signed by Cara JONETTA Lovelace MD Signature Date/Time: 09/03/2023/3:50:33 PM    Final    CT CHEST WO CONTRAST Result Date: 09/02/2023 CLINICAL DATA:  Lung opacity, cough, pleural effusions EXAM: CT CHEST WITHOUT CONTRAST TECHNIQUE: Multidetector CT imaging of the chest was performed following the standard protocol without IV contrast. RADIATION DOSE REDUCTION: This exam was performed according to the departmental dose-optimization program which includes automated exposure control, adjustment of the mA and/or kV according to patient size and/or use of iterative reconstruction technique. COMPARISON:  None Available. FINDINGS: Cardiovascular: Extensive coronary and aortic calcified atheromatous plaque. Fusiform 3.4 cm dilatation of proximal descending thoracic aorta. Heart size upper limits normal. Small pericardial effusion. Mediastinum/Nodes: No mediastinal hematoma or mass. Scattered subcentimeter mediastinal lymph nodes, none greater than 1 cm short axis diameter. No definite hilar adenopathy, sensitivity decreased without IV contrast. Lungs/Pleura: Small pleural effusions. No pneumothorax.  Pulmonary emphysema. Coarse airspace opacities with adjacent subpleural cystic changes in the posterior right upper lobe abutting the fissure. Mild dependent atelectasis in both lower lobes. 3 mm subpleural nodules in the superior segment left lower lobe (Im93,Se4) and lateral right upper lobe image 73. Upper Abdomen: Bilateral renal cystic lesions, incompletely characterized. Calcifications in bilateral renal hila may be atherosclerotic or nephrolithiasis. No definite hydronephrosis in visualized portions. No acute findings. Musculoskeletal: No chest wall mass or suspicious bone lesions identified. IMPRESSION: 1. Small pleural effusions with mild dependent atelectasis. 2. Coarse airspace opacities with adjacent subpleural cystic changes in the posterior right upper lobe, possibly scarring. 3. 3 mm subpleural nodules in the right upper and left lower lobes. No follow-up needed if patient is low-risk.This recommendation follows the consensus statement: Guidelines for Management of Incidental Pulmonary Nodules Detected on CT Images: From the Fleischner Society 2017; Radiology 2017; 284:228-243. 4. Aortic Atherosclerosis (ICD10-I70.0) and Emphysema (ICD10-J43.9). Electronically Signed   By: JONETTA Faes M.D.   On: 09/02/2023 15:26   DG Chest 2 View Result Date: 09/01/2023 CLINICAL DATA:  SOB EXAM: CHEST - 2 VIEW COMPARISON:  11/05/2014 FINDINGS: New moderate right-sided and small left-sided bilateral pleural effusions. Dependent atelectasis above the effusions in the lung bases. No pneumothorax. Calcified aorta. Cardiac silhouette prominent. Osseous structures are intact. IMPRESSION: Bilateral pleural effusions. Enlarged cardiac silhouette. Mild bibasilar consolidation or volume loss. Electronically Signed   By: Fonda Field M.D.   On: 09/01/2023 19:11    Microbiology: Recent Results (from the past 240 hours)  Resp panel by RT-PCR (RSV, Flu A&B, Covid) Anterior Nasal Swab     Status: None   Collection Time:  09/01/23  6:06 PM   Specimen: Anterior Nasal Swab  Result Value Ref Range Status   SARS Coronavirus 2 by RT PCR NEGATIVE NEGATIVE Final    Comment: (NOTE) SARS-CoV-2 target nucleic acids are NOT DETECTED.  The SARS-CoV-2 RNA is generally detectable  in upper respiratory specimens during the acute phase of infection. The lowest concentration of SARS-CoV-2 viral copies this assay can detect is 138 copies/mL. A negative result does not preclude SARS-Cov-2 infection and should not be used as the sole basis for treatment or other patient management decisions. A negative result may occur with  improper specimen collection/handling, submission of specimen other than nasopharyngeal swab, presence of viral mutation(s) within the areas targeted by this assay, and inadequate number of viral copies(<138 copies/mL). A negative result must be combined with clinical observations, patient history, and epidemiological information. The expected result is Negative.  Fact Sheet for Patients:  bloggercourse.com  Fact Sheet for Healthcare Providers:  seriousbroker.it  This test is no t yet approved or cleared by the [place]  FDA and  has been authorized for detection and/or diagnosis of SARS-CoV-2 by FDA under an Emergency Use Authorization (EUA). This EUA will remain  in effect (meaning this test can be used) for the duration of the COVID-19 declaration under Section 564(b)(1) of the Act, 21 U.S.C.section 360bbb-3(b)(1), unless the authorization is terminated  or revoked sooner.       Influenza A by PCR NEGATIVE NEGATIVE Final   Influenza B by PCR NEGATIVE NEGATIVE Final    Comment: (NOTE) The Xpert Xpress SARS-CoV-2/FLU/RSV plus assay is intended as an aid in the diagnosis of influenza from Nasopharyngeal swab specimens and should not be used as a sole basis for treatment. Nasal washings and aspirates are unacceptable for Xpert Xpress  SARS-CoV-2/FLU/RSV testing.  Fact Sheet for Patients: bloggercourse.com  Fact Sheet for Healthcare Providers: seriousbroker.it  This test is not yet approved or cleared by the [place]  FDA and has been authorized for detection and/or diagnosis of SARS-CoV-2 by FDA under an Emergency Use Authorization (EUA). This EUA will remain in effect (meaning this test can be used) for the duration of the COVID-19 declaration under Section 564(b)(1) of the Act, 21 U.S.C. section 360bbb-3(b)(1), unless the authorization is terminated or revoked.     Resp Syncytial Virus by PCR NEGATIVE NEGATIVE Final    Comment: (NOTE) Fact Sheet for Patients: bloggercourse.com  Fact Sheet for Healthcare Providers: seriousbroker.it  This test is not yet approved or cleared by the [place]  FDA and has been authorized for detection and/or diagnosis of SARS-CoV-2 by FDA under an Emergency Use Authorization (EUA). This EUA will remain in effect (meaning this test can be used) for the duration of the COVID-19 declaration under Section 564(b)(1) of the Act, 21 U.S.C. section 360bbb-3(b)(1), unless the authorization is terminated or revoked.  Performed at [facility], [address] [place], [place] [address]   Respiratory (~20 pathogens) panel by PCR     Status: None   Collection Time: 09/02/23 12:40 PM   Specimen: Nasopharyngeal Swab; Respiratory  Result Value Ref Range Status   Adenovirus NOT DETECTED NOT DETECTED Final   Coronavirus 229E NOT DETECTED NOT DETECTED Final    Comment: (NOTE) The Coronavirus on the Respiratory Panel, DOES NOT test for the novel  Coronavirus (2019 nCoV)    Coronavirus HKU1 NOT DETECTED NOT DETECTED Final   Coronavirus NL63 NOT DETECTED NOT DETECTED Final   Coronavirus OC43 NOT DETECTED NOT DETECTED Final   Metapneumovirus NOT DETECTED NOT DETECTED Final    Rhinovirus / Enterovirus NOT DETECTED NOT DETECTED Final   Influenza A NOT DETECTED NOT DETECTED Final   Influenza B NOT DETECTED NOT DETECTED Final   Parainfluenza Virus 1 NOT DETECTED NOT DETECTED Final   Parainfluenza Virus  2 NOT DETECTED NOT DETECTED Final   Parainfluenza Virus 3 NOT DETECTED NOT DETECTED Final   Parainfluenza Virus 4 NOT DETECTED NOT DETECTED Final   Respiratory Syncytial Virus NOT DETECTED NOT DETECTED Final   Bordetella pertussis NOT DETECTED NOT DETECTED Final   Bordetella Parapertussis NOT DETECTED NOT DETECTED Final   Chlamydophila pneumoniae NOT DETECTED NOT DETECTED Final   Mycoplasma pneumoniae NOT DETECTED NOT DETECTED Final    Comment: Performed at [facility], [address], [place], [place] [address]     Labs: Basic Metabolic Panel: Recent Labs  Lab 09/01/23 1806 09/02/23 0617 09/03/23 0419  NA 136 135 133*  K 4.8 4.3 4.1  CL 101 98 95*  CO2 28 28 28   GLUCOSE 103* 101* 106*  BUN 15 14 18   CREATININE 1.04 0.99 1.12  CALCIUM  8.3* 8.6* 9.0  MG 1.9  --  1.5*   Liver Function Tests: Recent Labs  Lab 09/01/23 1806  AST 17  ALT 11  ALKPHOS 126  BILITOT 0.7  PROT 6.7  ALBUMIN 2.9*   No results for input(s): LIPASE, AMYLASE in the last 168 hours. No results for input(s): AMMONIA in the last 168 hours. CBC: Recent Labs  Lab 09/01/23 1806 09/02/23 0617  WBC 8.4 10.3  NEUTROABS 5.3  --   HGB 11.2* 11.3*  HCT 36.4* 36.4*  MCV 92.9 92.2  PLT 225 221   Cardiac Enzymes: No results for input(s): CKTOTAL, CKMB, CKMBINDEX, TROPONINI in the last 168 hours. BNP: BNP (last 3 results) Recent Labs    09/01/23 1806  BNP 2,867.3*    ProBNP (last 3 results) No results for input(s): PROBNP in the last 8760 hours.  CBG: No results for input(s): GLUCAP in the last 168 hours.     Signed:  Devaughn KATHEE Ban MD.  [facility] 09/03/2023, 4:23 PM

## 2023-09-03 NOTE — Progress Notes (Signed)
*  PRELIMINARY RESULTS* Echocardiogram 2D Echocardiogram has been performed.  Richard Rivers 09/03/2023, 2:24 PM

## 2023-09-12 ENCOUNTER — Inpatient Hospital Stay (HOSPITAL_COMMUNITY)
Admission: EM | Admit: 2023-09-12 | Discharge: 2023-09-16 | DRG: 280 | Disposition: A | Discharge status: Home Health | Payer: No Typology Code available for payment source | Attending: Internal Medicine | Admitting: Internal Medicine

## 2023-09-12 ENCOUNTER — Other Ambulatory Visit: Payer: Self-pay

## 2023-09-12 ENCOUNTER — Encounter (HOSPITAL_COMMUNITY): Payer: Self-pay

## 2023-09-12 ENCOUNTER — Emergency Department (HOSPITAL_COMMUNITY): Payer: No Typology Code available for payment source

## 2023-09-12 DIAGNOSIS — I2510 Atherosclerotic heart disease of native coronary artery without angina pectoris: Secondary | ICD-10-CM | POA: Diagnosis present

## 2023-09-12 DIAGNOSIS — I110 Hypertensive heart disease with heart failure: Secondary | ICD-10-CM | POA: Diagnosis present

## 2023-09-12 DIAGNOSIS — I739 Peripheral vascular disease, unspecified: Secondary | ICD-10-CM | POA: Diagnosis present

## 2023-09-12 DIAGNOSIS — F17210 Nicotine dependence, cigarettes, uncomplicated: Secondary | ICD-10-CM | POA: Diagnosis present

## 2023-09-12 DIAGNOSIS — E440 Moderate protein-calorie malnutrition: Secondary | ICD-10-CM | POA: Insufficient documentation

## 2023-09-12 DIAGNOSIS — Z681 Body mass index (BMI) 19 or less, adult: Secondary | ICD-10-CM

## 2023-09-12 DIAGNOSIS — I5043 Acute on chronic combined systolic (congestive) and diastolic (congestive) heart failure: Secondary | ICD-10-CM | POA: Diagnosis present

## 2023-09-12 DIAGNOSIS — Z7901 Long term (current) use of anticoagulants: Secondary | ICD-10-CM

## 2023-09-12 DIAGNOSIS — M109 Gout, unspecified: Secondary | ICD-10-CM | POA: Diagnosis present

## 2023-09-12 DIAGNOSIS — Z716 Tobacco abuse counseling: Secondary | ICD-10-CM

## 2023-09-12 DIAGNOSIS — R54 Age-related physical debility: Secondary | ICD-10-CM | POA: Diagnosis present

## 2023-09-12 DIAGNOSIS — D649 Anemia, unspecified: Secondary | ICD-10-CM | POA: Diagnosis present

## 2023-09-12 DIAGNOSIS — R7303 Prediabetes: Secondary | ICD-10-CM | POA: Diagnosis present

## 2023-09-12 DIAGNOSIS — Z801 Family history of malignant neoplasm of trachea, bronchus and lung: Secondary | ICD-10-CM

## 2023-09-12 DIAGNOSIS — M19041 Primary osteoarthritis, right hand: Secondary | ICD-10-CM | POA: Diagnosis present

## 2023-09-12 DIAGNOSIS — N179 Acute kidney failure, unspecified: Secondary | ICD-10-CM | POA: Diagnosis present

## 2023-09-12 DIAGNOSIS — Z885 Allergy status to narcotic agent status: Secondary | ICD-10-CM

## 2023-09-12 DIAGNOSIS — J101 Influenza due to other identified influenza virus with other respiratory manifestations: Secondary | ICD-10-CM | POA: Diagnosis present

## 2023-09-12 DIAGNOSIS — E785 Hyperlipidemia, unspecified: Secondary | ICD-10-CM | POA: Diagnosis present

## 2023-09-12 DIAGNOSIS — I214 Non-ST elevation (NSTEMI) myocardial infarction: Principal | ICD-10-CM | POA: Diagnosis present

## 2023-09-12 DIAGNOSIS — I2582 Chronic total occlusion of coronary artery: Secondary | ICD-10-CM | POA: Diagnosis present

## 2023-09-12 DIAGNOSIS — Z880 Allergy status to penicillin: Secondary | ICD-10-CM

## 2023-09-12 DIAGNOSIS — Z79899 Other long term (current) drug therapy: Secondary | ICD-10-CM | POA: Diagnosis not present

## 2023-09-12 DIAGNOSIS — Z1152 Encounter for screening for COVID-19: Secondary | ICD-10-CM

## 2023-09-12 DIAGNOSIS — I219 Acute myocardial infarction, unspecified: Secondary | ICD-10-CM | POA: Diagnosis present

## 2023-09-12 DIAGNOSIS — I252 Old myocardial infarction: Secondary | ICD-10-CM | POA: Diagnosis not present

## 2023-09-12 DIAGNOSIS — M19042 Primary osteoarthritis, left hand: Secondary | ICD-10-CM | POA: Diagnosis present

## 2023-09-12 DIAGNOSIS — I272 Pulmonary hypertension, unspecified: Secondary | ICD-10-CM | POA: Diagnosis present

## 2023-09-12 DIAGNOSIS — J441 Chronic obstructive pulmonary disease with (acute) exacerbation: Secondary | ICD-10-CM | POA: Diagnosis present

## 2023-09-12 DIAGNOSIS — Z955 Presence of coronary angioplasty implant and graft: Secondary | ICD-10-CM

## 2023-09-12 HISTORY — DX: Peripheral vascular disease, unspecified: I73.9

## 2023-09-12 HISTORY — DX: Chronic obstructive pulmonary disease, unspecified: J44.9

## 2023-09-12 HISTORY — DX: Disorder of arteries and arterioles, unspecified: I77.9

## 2023-09-12 HISTORY — DX: Unspecified systolic (congestive) heart failure: I50.20

## 2023-09-12 LAB — CBC WITH DIFFERENTIAL/PLATELET
Abs Immature Granulocytes: 0.09 10*3/uL — ABNORMAL HIGH (ref 0.00–0.07)
Basophils Absolute: 0 10*3/uL (ref 0.0–0.1)
Basophils Relative: 0 %
Eosinophils Absolute: 0 10*3/uL (ref 0.0–0.5)
Eosinophils Relative: 0 %
HCT: 32.8 % — ABNORMAL LOW (ref 39.0–52.0)
Hemoglobin: 10.6 g/dL — ABNORMAL LOW (ref 13.0–17.0)
Immature Granulocytes: 1 %
Lymphocytes Relative: 9 %
Lymphs Abs: 0.9 10*3/uL (ref 0.7–4.0)
MCH: 28.7 pg (ref 26.0–34.0)
MCHC: 32.3 g/dL (ref 30.0–36.0)
MCV: 88.9 fL (ref 80.0–100.0)
Monocytes Absolute: 0.5 10*3/uL (ref 0.1–1.0)
Monocytes Relative: 5 %
Neutro Abs: 8.5 10*3/uL — ABNORMAL HIGH (ref 1.7–7.7)
Neutrophils Relative %: 85 %
Platelets: 208 10*3/uL (ref 150–400)
RBC: 3.69 MIL/uL — ABNORMAL LOW (ref 4.22–5.81)
RDW: 15.6 % — ABNORMAL HIGH (ref 11.5–15.5)
WBC: 10.1 10*3/uL (ref 4.0–10.5)
nRBC: 0 % (ref 0.0–0.2)

## 2023-09-12 LAB — GLUCOSE, CAPILLARY: Glucose-Capillary: 132 mg/dL — ABNORMAL HIGH (ref 70–99)

## 2023-09-12 LAB — I-STAT CHEM 8, ED
BUN: 44 mg/dL — ABNORMAL HIGH (ref 8–23)
Calcium, Ion: 1.02 mmol/L — ABNORMAL LOW (ref 1.15–1.40)
Chloride: 102 mmol/L (ref 98–111)
Creatinine, Ser: 1.6 mg/dL — ABNORMAL HIGH (ref 0.61–1.24)
Glucose, Bld: 118 mg/dL — ABNORMAL HIGH (ref 70–99)
HCT: 32 % — ABNORMAL LOW (ref 39.0–52.0)
Hemoglobin: 10.9 g/dL — ABNORMAL LOW (ref 13.0–17.0)
Potassium: 3.7 mmol/L (ref 3.5–5.1)
Sodium: 138 mmol/L (ref 135–145)
TCO2: 27 mmol/L (ref 22–32)

## 2023-09-12 LAB — COMPREHENSIVE METABOLIC PANEL
ALT: 61 U/L — ABNORMAL HIGH (ref 0–44)
AST: 91 U/L — ABNORMAL HIGH (ref 15–41)
Albumin: 2.6 g/dL — ABNORMAL LOW (ref 3.5–5.0)
Alkaline Phosphatase: 84 U/L (ref 38–126)
Anion gap: 13 (ref 5–15)
BUN: 48 mg/dL — ABNORMAL HIGH (ref 8–23)
CO2: 25 mmol/L (ref 22–32)
Calcium: 8.4 mg/dL — ABNORMAL LOW (ref 8.9–10.3)
Chloride: 99 mmol/L (ref 98–111)
Creatinine, Ser: 1.71 mg/dL — ABNORMAL HIGH (ref 0.61–1.24)
GFR, Estimated: 40 mL/min — ABNORMAL LOW (ref >60)
Glucose, Bld: 125 mg/dL — ABNORMAL HIGH (ref 70–99)
Potassium: 3.8 mmol/L (ref 3.5–5.1)
Sodium: 137 mmol/L (ref 135–145)
Total Bilirubin: 0.7 mg/dL (ref 0.0–1.2)
Total Protein: 5.9 g/dL — ABNORMAL LOW (ref 6.5–8.1)

## 2023-09-12 LAB — HEPARIN LEVEL (UNFRACTIONATED): Heparin Unfractionated: >1.1 [IU]/mL — ABNORMAL HIGH (ref 0.30–0.70)

## 2023-09-12 LAB — RESP PANEL BY RT-PCR (RSV, FLU A&B, COVID)  RVPGX2
Influenza A by PCR: POSITIVE — AB
Influenza B by PCR: NEGATIVE
Resp Syncytial Virus by PCR: NEGATIVE
SARS Coronavirus 2 by RT PCR: NEGATIVE

## 2023-09-12 LAB — TROPONIN I (HIGH SENSITIVITY)
Troponin I (High Sensitivity): 2877 ng/L (ref <18)
Troponin I (High Sensitivity): 3859 ng/L (ref <18)

## 2023-09-12 LAB — BRAIN NATRIURETIC PEPTIDE: B Natriuretic Peptide: 2799 pg/mL — ABNORMAL HIGH (ref 0.0–100.0)

## 2023-09-12 LAB — APTT: aPTT: 52 s — ABNORMAL HIGH (ref 24–36)

## 2023-09-12 MED ORDER — IPRATROPIUM-ALBUTEROL 0.5-2.5 (3) MG/3ML IN SOLN
3.0000 mL | RESPIRATORY_TRACT | Status: AC
  Administered 2023-09-12 (×3): 3 mL via RESPIRATORY_TRACT
  Filled 2023-09-12: qty 6

## 2023-09-12 MED ORDER — CARVEDILOL 12.5 MG PO TABS
12.5000 mg | ORAL_TABLET | Freq: Two times a day (BID) | ORAL | Status: DC
  Administered 2023-09-12 – 2023-09-16 (×8): 12.5 mg via ORAL
  Filled 2023-09-12 (×8): qty 1

## 2023-09-12 MED ORDER — FLUTICASONE FUROATE-VILANTEROL 100-25 MCG/ACT IN AEPB
1.0000 | INHALATION_SPRAY | Freq: Every day | RESPIRATORY_TRACT | Status: DC
  Administered 2023-09-13 – 2023-09-16 (×4): 1 via RESPIRATORY_TRACT
  Filled 2023-09-12: qty 28

## 2023-09-12 MED ORDER — OSELTAMIVIR PHOSPHATE 30 MG PO CAPS
30.0000 mg | ORAL_CAPSULE | Freq: Two times a day (BID) | ORAL | Status: DC
Start: 2023-09-13 — End: 2023-09-17
  Administered 2023-09-13 – 2023-09-16 (×7): 30 mg via ORAL
  Filled 2023-09-12 (×8): qty 1

## 2023-09-12 MED ORDER — PREDNISONE 20 MG PO TABS
40.0000 mg | ORAL_TABLET | Freq: Every day | ORAL | Status: AC
  Administered 2023-09-13 – 2023-09-16 (×4): 40 mg via ORAL
  Filled 2023-09-12 (×4): qty 2

## 2023-09-12 MED ORDER — NITROGLYCERIN IN D5W 200-5 MCG/ML-% IV SOLN
0.0000 ug/min | INTRAVENOUS | Status: DC
  Administered 2023-09-12: 5 ug/min via INTRAVENOUS
  Filled 2023-09-12: qty 250

## 2023-09-12 MED ORDER — POLYETHYLENE GLYCOL 3350 17 G PO PACK: 17.0000 g | PACK | Freq: Every day | ORAL | Status: DC | PRN

## 2023-09-12 MED ORDER — NITROGLYCERIN 0.4 MG SL SUBL: 0.4000 mg | SUBLINGUAL_TABLET | SUBLINGUAL | Status: DC | PRN

## 2023-09-12 MED ORDER — UMECLIDINIUM BROMIDE 62.5 MCG/ACT IN AEPB
1.0000 | INHALATION_SPRAY | Freq: Every day | RESPIRATORY_TRACT | Status: DC
  Administered 2023-09-13 – 2023-09-16 (×4): 1 via RESPIRATORY_TRACT
  Filled 2023-09-12: qty 7

## 2023-09-12 MED ORDER — INSULIN ASPART 100 UNIT/ML IJ SOLN
0.0000 [IU] | Freq: Three times a day (TID) | INTRAMUSCULAR | Status: DC
  Administered 2023-09-14: 2 [IU] via SUBCUTANEOUS
  Administered 2023-09-14: 1 [IU] via SUBCUTANEOUS
  Administered 2023-09-16: 2 [IU] via SUBCUTANEOUS

## 2023-09-12 MED ORDER — HYDROXYZINE HCL 25 MG PO TABS
25.0000 mg | ORAL_TABLET | Freq: Two times a day (BID) | ORAL | Status: AC | PRN
  Administered 2023-09-12 – 2023-09-15 (×2): 25 mg via ORAL
  Filled 2023-09-12 (×2): qty 1

## 2023-09-12 MED ORDER — OSELTAMIVIR PHOSPHATE 75 MG PO CAPS
75.0000 mg | ORAL_CAPSULE | Freq: Once | ORAL | Status: AC
  Administered 2023-09-12: 75 mg via ORAL
  Filled 2023-09-12: qty 1

## 2023-09-12 MED ORDER — SENNOSIDES-DOCUSATE SODIUM 8.6-50 MG PO TABS
1.0000 | ORAL_TABLET | Freq: Two times a day (BID) | ORAL | Status: AC
  Administered 2023-09-12 – 2023-09-15 (×5): 1 via ORAL
  Filled 2023-09-12 (×6): qty 1

## 2023-09-12 MED ORDER — ASPIRIN 81 MG PO CHEW
324.0000 mg | CHEWABLE_TABLET | Freq: Once | ORAL | Status: DC
  Filled 2023-09-12 (×2): qty 4

## 2023-09-12 MED ORDER — ONDANSETRON HCL 4 MG/2ML IJ SOLN
4.0000 mg | Freq: Once | INTRAMUSCULAR | Status: AC
  Administered 2023-09-12: 4 mg via INTRAVENOUS
  Filled 2023-09-12: qty 2

## 2023-09-12 MED ORDER — NICOTINE 21 MG/24HR TD PT24
21.0000 mg | MEDICATED_PATCH | Freq: Every day | TRANSDERMAL | Status: DC
Start: 2023-09-12 — End: 2023-09-16
  Administered 2023-09-12 – 2023-09-16 (×5): 21 mg via TRANSDERMAL
  Filled 2023-09-12 (×5): qty 1

## 2023-09-12 MED ORDER — TRAZODONE HCL 100 MG PO TABS
100.0000 mg | ORAL_TABLET | Freq: Every day | ORAL | Status: DC
  Administered 2023-09-12 – 2023-09-15 (×4): 100 mg via ORAL
  Filled 2023-09-12 (×4): qty 1

## 2023-09-12 MED ORDER — ACETAMINOPHEN 325 MG PO TABS
650.0000 mg | ORAL_TABLET | ORAL | Status: DC | PRN
  Administered 2023-09-14: 650 mg via ORAL
  Filled 2023-09-12: qty 2

## 2023-09-12 MED ORDER — ATORVASTATIN CALCIUM 80 MG PO TABS
80.0000 mg | ORAL_TABLET | Freq: Every day | ORAL | Status: DC
  Administered 2023-09-12 – 2023-09-15 (×4): 80 mg via ORAL
  Filled 2023-09-12 (×4): qty 1

## 2023-09-12 MED ORDER — HEPARIN (PORCINE) 25000 UT/250ML-% IV SOLN
950.0000 [IU]/h | INTRAVENOUS | Status: AC
  Administered 2023-09-12: 800 [IU]/h via INTRAVENOUS
  Administered 2023-09-13: 1000 [IU]/h via INTRAVENOUS
  Filled 2023-09-12 (×2): qty 250

## 2023-09-12 MED ORDER — FUROSEMIDE 10 MG/ML IJ SOLN
40.0000 mg | Freq: Once | INTRAMUSCULAR | Status: AC
  Administered 2023-09-12: 40 mg via INTRAVENOUS
  Filled 2023-09-12: qty 4

## 2023-09-12 MED ORDER — ONDANSETRON HCL 4 MG/2ML IJ SOLN
4.0000 mg | Freq: Four times a day (QID) | INTRAMUSCULAR | Status: DC | PRN
Start: 2023-09-12 — End: 2023-09-16

## 2023-09-12 MED ORDER — BUSPIRONE HCL 5 MG PO TABS
10.0000 mg | ORAL_TABLET | Freq: Two times a day (BID) | ORAL | Status: DC
  Administered 2023-09-12 – 2023-09-16 (×9): 10 mg via ORAL
  Filled 2023-09-12 (×5): qty 2
  Filled 2023-09-12: qty 1
  Filled 2023-09-12 (×3): qty 2

## 2023-09-12 MED ORDER — OSELTAMIVIR PHOSPHATE 75 MG PO CAPS: 75.0000 mg | ORAL_CAPSULE | Freq: Two times a day (BID) | ORAL | Status: DC

## 2023-09-12 MED ORDER — FAMOTIDINE 20 MG PO TABS
20.0000 mg | ORAL_TABLET | Freq: Every day | ORAL | Status: DC
  Administered 2023-09-13 – 2023-09-16 (×4): 20 mg via ORAL
  Filled 2023-09-12 (×4): qty 1

## 2023-09-12 NOTE — Progress Notes (Signed)
 PHARMACY - ANTICOAGULATION Pharmacy Consult for heparin  Indication: chest pain/ACS Brief A/P: aPTT subtherapeutic Increase Heparin  rate  Allergies  Allergen Reactions   Codeine Itching and Swelling    Throat swelling   Penicillin G Swelling    Throat    Patient Measurements: Height: 6' (182.9 cm) Weight: 65.9 kg (145 lb 4.5 oz) IBW/kg (Calculated) : 77.6 Heparin  Dosing Weight: TBW  Vital Signs: Temp: 99 F (37.2 C) (01/12 2029) Temp Source: Oral (01/12 2029) BP: 144/98 (01/12 2200) Pulse Rate: 82 (01/12 2029)  Labs: Recent Labs    09/12/23 1323 09/12/23 1533 09/12/23 1539 09/12/23 2120  HGB 10.6*  --  10.9*  --   HCT 32.8*  --  32.0*  --   PLT 208  --   --   --   APTT  --   --   --  52*  HEPARINUNFRC  --   --   --  >1.10*  CREATININE  --  1.71* 1.60*  --   TROPONINIHS 2,877*  --   --  3,859*    Estimated Creatinine Clearance: 35.5 mL/min (A) (by C-G formula based on SCr of 1.6 mg/dL (H)).  Assessment: 78 y.o. male with admitted with ACS, h/o PAD and Xarelto  on hold, for heparin   Goal of Therapy:  Heparin  level 0.3-0.7 units/ml aPTT 66-102 seconds Monitor platelets by anticoagulation protocol: Yes   Plan:  Increase Heparin  1000 units/hr Follow-up am labs.  Cathlyn Arrant, PharmD, BCPS  09/12/2023 11:18 PM

## 2023-09-12 NOTE — Hospital Course (Signed)
 Cough, subjective fevers/chills, SHOB for past 4 days -EMS - solumedrol, duonebs, magnesium  -probably COPD exacerbation?  Troponin 2,877 -cardiology to see -heparin  gtt     COPD -ipratropium-albuterol  at home  HFrEF CAD -coreg , lisinopril  -lasix  20mg  daily  Pulm HTN  HTN -lisinopril  40mg  daily -coreg  12.5mg  BID  HLD -lipitor  80mg  daily  Gout -allopurinol  300mg  daily -colchicine  0.6mg  daily  Depression and anxiety -takes trazodone  and buspirone  at home

## 2023-09-12 NOTE — ED Triage Notes (Signed)
 Pt coming in from home with shortness of breath. Ems reports wheezes bilaterally. Pt reports worsening shortness of breath starting 3 days ago. Pt was vomiting for EMS.  EMS gave  125mg  solumedrol  2g mag sulfate 4mg  zofran  15mg  albuterol   0.8 mg nitrotab 325 Asprin   EMS vitals  Hr 120 Spo2 was 80% on room air improved to 98% with neb  Bp 180/100 Rr 30  Cbg 151
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
## 2023-09-12 NOTE — ED Notes (Signed)
 Pt has critical troponin provider notified

## 2023-09-12 NOTE — Consult Note (Addendum)
 Cardiology Consultation   Patient ID: [name] MRN: [id number]; DOB: 01-05-1946  Admit date: 09/12/2023 Date of Consult: 09/12/2023  PCP:  System, Provider Not In   [facility] Providers Cardiologist:  None      Cardiologist: Almarie Aloe, MD [facility])  Patient Profile:   Richard Rivers is a 78 y.o. 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 S/p complex PCI [facility]) in 02/2022  S/p rotational atherectomy, cutting balloon angioplasty and 3.25 18 mm DES to LM (required IABP support) Known CTO of RCA with L-R collaterals, mLAD 50, OM1 60 (cath 02/2022) (HFrEF) heart failure with reduced ejection fraction  TTE at [facility] 03/10/22: EF 50, mild to mod TR, RVSP 56 TTE 09/03/23: EF 35-40, global HK, Gr 2 DD (?infiltrative process), low NL RVSF, mild RVE, severe Pul HTN, mild to mod BAE, mild MR, mild to mod TR Peripheral arterial disease  S/p R femoral endarterectomy and fem-peroneal bypass in 05/2020 S/p R fem-peroneal bypass in 02/2022 Rx w Rivaroxaban  due to ext peripheral arterial disease  Carotid artery disease S/p L CEA  Hypertension  Hyperlipidemia  Chronic Obstructive Pulmonary Disease  Pulmonary hypertension  Tobacco use   who is being seen 09/12/2023 for the evaluation of elevated troponin at the request of Dr. Emmy.  History of Present Illness:   Richard Rivers is followed by Dr. Aloe for cardiology at the [facility]. He is a [place]. He notes he stepped on a landmine while deployed to [place] with several injuries to his legs, hands.    He was admitted to [facility] 1/1-1/3 with a/c CHF tx with IV Lasix . He was also tx with steroids for possible AECOPD. His hsTroponins were mildly elevated and flat (28 - 33 - 57). EF was down at 35-40 on TTE, severe pulmonary hypertension. Pt was to follow up with Cardiology at the [facility] as an OP for eval. GDMT at DC was:  Coreg  12.5, Furosemide  20, Lisinopril  40.  He presented to [facility] today with symptoms of shortness of breath.  BNP 2799 hsTroponin 2877 Hgb 10.6, Scr 1.6 Flu A + CXR: chronic interstitial coarsening, no acute findings, pleural effusions resolved EKG: NSR, HR 92, normal axis, 1 to 2 mm of downsloping ST depression in inferolateral and anterolateral leads, QTc 437-ST changes worse compared to EKG from 09/01/2023  He notes he was feeling better after his admit earlier this month until today. He started having severe L sided chest pain and shortness of breath. He did not have any radiation to his arm or jaw. He notes orthopnea, paroxysmal nocturnal dyspnea. He has a chronic cough with yellow sputum which is unchanged. He has not had edema, syncope. He felt better with SL NTG. Currently he is having some mild L sided chest pain but is comfortable in bed. He notes he feels significantly better than he did when he arrived.   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 // s/p complex PCI [facility]) in 02/2022 // s/p rotational atherectomy, cutting balloon angioplasty and 3.25 18 mm DES to LM (required IABP support) // Known CTO of RCA with L-R collaterals, mLAD 50, OM1 60 (cath 02/2022)   Carotid artery disease (HCC)    s/p L CEA  COPD (chronic obstructive pulmonary disease) (HCC)    Gout    HFrEF (heart failure with reduced ejection fraction) (HCC)    TTE 09/03/23: EF 35-40, global HK, Gr 2 DD (?infiltrative process), low NL RVSF, mild RVE, severe Pul HTN, mild to mod BAE, mild MR, mild to mod TR   HLD (hyperlipidemia)    Hypertension    Numbness in both legs    and feet, s/p landmine injury in [place]   PAD (peripheral artery disease) (HCC)    S/p R femoral endarterectomy and fem-peroneal bypass in 05/2020 // s/p R fem-peroneal bypass in 02/2022 // Rx w Rivaroxaban   due to ext peripheral arterial disease   Wears dentures    full upper and lower    Past Surgical History:  Procedure Laterality Date   COLONOSCOPY WITH PROPOFOL  N/A 03/05/2016   Procedure: COLONOSCOPY WITH PROPOFOL ;  Surgeon: Rogelia Copping, MD;  Location: [facility];  Service: Endoscopy;  Laterality: N/A;   [facility]     [facility]   HIP FRACTURE SURGERY Right    3 screws   POLYPECTOMY  03/05/2016   Procedure: POLYPECTOMY;  Surgeon: Rogelia Copping, MD;  Location: [facility];  Service: Endoscopy;;     Home Medications:  Prior to Admission medications   Medication Sig Start Date End Date Taking? Authorizing Provider  acetaminophen  (TYLENOL ) 325 MG tablet Take 650 mg by mouth 3 (three) times daily as needed.   Yes [provider]  allopurinol  (ZYLOPRIM ) 300 MG tablet Take 300 mg by mouth daily.   Yes [provider]  ammonium lactate (LAC-HYDRIN) 12 % lotion Apply 1 Application topically 2 (two) times daily as needed. APPLY TO DRY SKIN ON FEET 04/14/23  Yes [provider]  atorvastatin  (LIPITOR ) 80 MG tablet Take 80 mg by mouth at bedtime. 04/14/23  Yes [provider]  busPIRone  (BUSPAR ) 10 MG tablet Take 10 mg by mouth 2 (two) times daily as needed.   Yes [provider]  carvedilol  (COREG ) 12.5 MG tablet Take 12.5 mg by mouth 2 (two) times daily with a meal.   Yes [provider]  colchicine  0.6 MG tablet Take 0.6 mg by mouth daily. 10/24/22  Yes [provider]  doxycycline  (ADOXA) 100 MG tablet Take 100 mg by mouth daily. 10/24/22  Yes [provider]  famotidine  (PEPCID ) 20 MG tablet Take 20 mg by mouth daily. 04/14/23  Yes [provider]  furosemide  (LASIX ) 20 MG tablet Take 1 tablet (20 mg total) by mouth daily. 09/03/23 09/02/24 Yes Wouk, Devaughn Sayres, MD  gabapentin  (NEURONTIN ) 300 MG capsule Take 600 mg by mouth 3 (three) times daily.   Yes [provider]  guaiFENesin   (MUCINEX ) 600 MG 12 hr tablet Take 600 mg by mouth every 12 (twelve) hours as needed for cough. 04/14/23  Yes [provider]  hydrOXYzine  (ATARAX ) 25 MG tablet Take 25 mg by mouth 2 (two) times daily as needed for anxiety. 12/28/22 10/17/23 Yes [provider]  Ipratropium-Albuterol  (COMBIVENT  RESPIMAT) 20-100 MCG/ACT AERS respimat Inhale 1 puff into the lungs 4 (four) times daily.   Yes [provider]  lisinopril  (ZESTRIL ) 40 MG tablet Take 1 tablet by mouth daily. 04/21/23  Yes [provider]  rivaroxaban  (XARELTO ) 20 MG TABS tablet Take 20 mg by mouth daily with supper. Last dose was taken at 20:30 pm 09/11/2023.   Yes [provider]  traZODone  (DESYREL ) 50 MG tablet Take 1-3 tablets by mouth at  bedtime. Take 2 tablets at night   Yes [provider]  docusate sodium  (COLACE) 50 MG capsule Take 50 mg by mouth daily as needed for mild constipation. Per son, patient has this medication but doesn't use often    [provider]    Inpatient Medications: Scheduled Meds:  aspirin   324 mg Oral Once   atorvastatin   80 mg Oral QHS   busPIRone   10 mg Oral BID   carvedilol   12.5 mg Oral BID WC   [START ON 09/13/2023] famotidine   20 mg Oral Daily   furosemide   40 mg Intravenous Once   traZODone   100 mg Oral QHS   Continuous Infusions:  heparin  800 Units/hr (09/12/23 1537)   nitroGLYCERIN      PRN Meds:acetaminophen , nitroGLYCERIN , ondansetron  (ZOFRAN ) IV  Allergies:    Allergies  Allergen Reactions   Codeine Itching and Swelling    Throat swelling   Penicillin G Swelling    Throat    Social History:   Social History   Socioeconomic History   Marital status: Widowed    Spouse name: Not on file   Number of children: Not on file   Years of education: Not on file   Highest education level: Not on file  Occupational History   Not on file  Tobacco Use   Smoking status: Heavy Smoker    Current packs/day: 3.00    Average  packs/day: 3.0 packs/day for 55.0 years (165.0 ttl pk-yrs)    Types: Cigarettes   Smokeless tobacco: Never  Substance and Sexual Activity   Alcohol  use: No   Drug use: No   Sexual activity: Not on file  Other Topics Concern   Not on file  Social History Narrative   Not on file   Social Drivers of Health   Financial Resource Strain: Not on file  Food Insecurity: No Food Insecurity (09/02/2023)   Hunger Vital Sign    Worried About Running Out of Food in the Last Year: Never true    Ran Out of Food in the Last Year: Never true  Transportation Needs: No Transportation Needs (09/02/2023)   PRAPARE - Administrator, Civil Service (Medical): No    Lack of Transportation (Non-Medical): No  Physical Activity: Not on file  Stress: Not on file  Social Connections: Socially Isolated (09/02/2023)   Social Connection and Isolation Panel [NHANES]    Frequency of Communication with Friends and Family: Three times a week    Frequency of Social Gatherings with Friends and Family: Never    Attends Religious Services: Never    Database Administrator or Organizations: No    Attends Banker Meetings: Never    Marital Status: Widowed  Intimate Partner Violence: Not At Risk (09/02/2023)   Humiliation, Afraid, Rape, and Kick questionnaire    Fear of Current or Ex-Partner: No    Emotionally Abused: No    Physically Abused: No    Sexually Abused: No    Family History:    Family History  Problem Relation Age of Onset   Ovarian cancer Mother    Lung cancer Father      ROS:  Please see the history of present illness.  No fever, melena, hematochezia, hematuria. All other ROS reviewed and negative.     Physical Exam/Data:   Vitals:   09/12/23 1259 09/12/23 1307 09/12/23 1400  BP:  129/71 118/72  Pulse:  96 91  Resp:  20 18  Temp:  (!) 97.3 F (  36.3 C)   TempSrc:  Oral   SpO2:  99% 100%  Weight: 65.9 kg    Height: 6' (1.829 m)     No intake or output data in the 24  hours ending 09/12/23 1638    09/12/2023   12:59 PM 09/03/2023   10:54 AM 09/02/2023   11:18 PM  Last 3 Weights  Weight (lbs) 145 lb 4.5 oz 145 lb 3.2 oz 154 lb 8.7 oz  Weight (kg) 65.9 kg 65.862 kg 70.1 kg     Body mass index is 19.7 kg/m.  General:  Well nourished, well developed, in no acute distress  HEENT: normal Neck: no JVD Vascular: No carotid bruits  Cardiac:  normal S1, S2; RRR; no murmur   Lungs:  clear to auscultation bilaterally, no wheezing, rhonchi or rales  Abd: soft   Ext: no edema Musculoskeletal:  No deformities Skin: warm and dry  Neuro:  CNs 2-12 intact, no focal abnormalities noted Psych:  Normal affect   EKG:  The EKG was personally reviewed and demonstrates:  see HPI Telemetry:  Telemetry was personally reviewed and demonstrates:  NSR  Relevant CV Studies:   Laboratory Data:  High Sensitivity Troponin:   Recent Labs  Lab 09/01/23 1806 09/01/23 2018 09/02/23 0618 09/12/23 1323  TROPONINIHS 28* 33* 57* 2,877*     Chemistry Recent Labs  Lab 09/12/23 1533 09/12/23 1539  NA 137 138  K 3.8 3.7  CL 99 102  CO2 25  --   GLUCOSE 125* 118*  BUN 48* 44*  CREATININE 1.71* 1.60*  CALCIUM  8.4*  --   GFRNONAA 40*  --   ANIONGAP 13  --     Recent Labs  Lab 09/12/23 1533  PROT 5.9*  ALBUMIN 2.6*  AST 91*  ALT 61*  ALKPHOS 84  BILITOT 0.7   Lipids No results for input(s): CHOL, TRIG, HDL, LABVLDL, LDLCALC, CHOLHDL in the last 168 hours.  Hematology Recent Labs  Lab 09/12/23 1323 09/12/23 1539  WBC 10.1  --   RBC 3.69*  --   HGB 10.6* 10.9*  HCT 32.8* 32.0*  MCV 88.9  --   MCH 28.7  --   MCHC 32.3  --   RDW 15.6*  --   PLT 208  --    Thyroid No results for input(s): TSH, FREET4 in the last 168 hours.  BNP Recent Labs  Lab 09/12/23 1328  BNP 2,799.0*    DDimer No results for input(s): DDIMER in the last 168 hours.   Radiology/Studies:  DG Chest Port 1 View Result Date: 09/12/2023 CLINICAL DATA:  Shortness  of breath. EXAM: PORTABLE CHEST 1 VIEW COMPARISON:  09/01/2023 FINDINGS: Stable asymmetric elevation right hemidiaphragm. Near complete resolution of pleural effusions seen previously. Interstitial markings are diffusely coarsened with chronic features. Chronic atelectasis or scarring noted right base. No focal consolidation or pleural effusion. Cardiopericardial silhouette is at upper limits of normal for size. Bones are diffusely demineralized. IMPRESSION: Chronic interstitial coarsening without acute cardiopulmonary findings. Pleural effusions seen previously have essentially resolved. Electronically Signed   By: Camellia Candle M.D.   On: 09/12/2023 13:27     Assessment and Plan:   1. NSTEMI 2. Coronary artery disease  He underwent complex PCI to his LM in 2023 at the [facility]. He has known CTO of the RCA with L-R collaterals, mod nonobstructive CAD in the LAD and OM. He was admitted 2 weeks ago with a/c CHF exacerbation and noted to have newly depressed LVF with  EF 35-40. He presents back today with similar symptoms. His BNP is essentially the same. His EKG shows diffuse ST depression in the inf and ant-lat leads. The EKG changes are similar to prior studies, but worse on this tracing. His hsTroponins were up during his last admit. But his Troponin is much higher here today. He has multiple comorbid illnesses. His SCr is up c/w acute kidney injury. He would be high risk for contrast induced nephropathy with cardiac catheterization. We should try to manage him medically as much as possible. It we cannot manage him well medically, he may need cardiac catheterization. -Continue IV Heparin  -Start IV NTG -Continue Atorvastatin  80, Coreg  12.5 -Continue to monitor hsTroponin -Echocardiogram   3. (HFrEF) heart failure with reduced ejection fraction  EF is down to 35-40 by recent echocardiogram, which is new. He was effectively diuresed last admit. CXR does not show significant findings of edema and pleural  effusions are almost resolved.  -Give 1 dose IV Lasix  -Monitor renal function   4. AKI Monitor renal function.  5. Chronic Obstructive Pulmonary Disease Per IM  6. Influenza A Per IM  7. Peripheral arterial disease  He is maintained on Xarelto  due to severe peripheral arterial disease.   Risk Assessment/Risk Scores:     TIMI Risk Score for Unstable Angina or Non-ST Elevation MI:   The patient's TIMI risk score is 7, which indicates a 41% risk of all cause mortality, new or recurrent myocardial infarction or need for urgent revascularization in the next 14 days.  New York  Heart Association (NYHA) Functional Class NYHA Class III   For questions or updates, please contact [facility] Please consult [email or web address] for contact info under   Signed, Richard Ferrier, PA-C  09/12/2023 4:38 PM  History and all data above reviewed.  Patient examined.  I agree with the findings as above.  The patient is an [place] year ago with a [place] [employer].  He stepped on a landline in [place].  He is service-connected and gets a lot of his care at the [facility].  The patient presented with shortness of breath.  He had been in [place] recently for this.  He was thought to have acute on chronic systolic and diastolic heart failure.  He comes back today because he is continuing to have shortness of breath with activities.  He is also been having some chest discomfort although he is more vague about this.  He does not think he is ever really had angina.  He describes events at the [facility] in 2023 when he was hospitalized for some venous work and apparently had an acute cardiac event that resulted in intervention as above.  Otherwise he does not really get a lot of chest discomfort.  He has been noticing moving around his home more shortness of breath.  He is limited in his activities getting around his house.  He smokes 2 or 3 packs of cigarettes a day.   He has not been having any new PND or orthopnea.  He sleeps on  a couch.  He has not been having any new palpitations, presyncope or syncope.  Has had some cough productive of sputum.  The patient exam reveals COR: Regular rate and rhythm,  Lungs: Decreased breath sounds without wheezing or crackles,  Abd: Positive bowel sounds normal frequency in pitch, bruits, rebound, guarding, Ext 2+ pulses, no edema, absent toes, multiple shrapnel wounds.  All available labs, radiology testing, previous records reviewed. Agree with documented assessment and plan.  NSTEMI: The patient is clearly having some ischemia.  He is not overtly symptomatic and there is no ST elevation.  The diffuse ST depression is more pronounced than previous.  However, I think he be high risk for invasive evaluation.  He has new renal insufficiency.  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  We do not have any of the records of previous imaging.  Given the fact that he is not in acute distress at this point I would not advise any invasive evaluation but we will manage him with heparin  and some IV nitroglycerin .  I agree with giving him some Lasix  as he probably has some excess volume.  This ischemia might be more demand ischemia related to an upper respiratory infection and some volume overload.  He and I had a long conversation about conservative management and he would agree with this unless he has progressive unstable symptoms during this hospitalization.  I will try to get the records from the [facility].  Richard Rivers  5:16 PM  09/12/2023

## 2023-09-12 NOTE — ED Provider Notes (Signed)
 [place] EMERGENCY DEPARTMENT AT [facility] Provider Note   CSN: [id number] Arrival date & time: 09/12/23  1243     History  Chief Complaint  Patient presents with   Shortness of Breath    Richard Rivers is a 78 y.o. male.  78 yo M with a chief complaint of difficulty breathing.  Going on for about 4 days now.  Cough fever.  He feels like he did when he was admitted to [facility] regional hospital at the beginning of the month.  At that time he was noted to have significant fluid gain and orthopnea.  He reports orthopnea but denies any significant weight gain or any change to edema to his legs.   Shortness of Breath      Home Medications Prior to Admission medications   Medication Sig Start Date End Date Taking? Authorizing Provider  acetaminophen  (TYLENOL ) 325 MG tablet Take 650 mg by mouth 3 (three) times daily as needed.   Yes [provider]  allopurinol  (ZYLOPRIM ) 300 MG tablet Take 300 mg by mouth daily.   Yes [provider]  ammonium lactate (LAC-HYDRIN) 12 % lotion Apply 1 Application topically 2 (two) times daily as needed. APPLY TO DRY SKIN ON FEET 04/14/23  Yes [provider]  atorvastatin  (LIPITOR ) 80 MG tablet Take 80 mg by mouth at bedtime. 04/14/23  Yes [provider]  busPIRone  (BUSPAR ) 10 MG tablet Take 10 mg by mouth 2 (two) times daily as needed.   Yes [provider]  carvedilol  (COREG ) 12.5 MG tablet Take 12.5 mg by mouth 2 (two) times daily with a meal.   Yes [provider]  colchicine  0.6 MG tablet Take 0.6 mg by mouth daily. 10/24/22  Yes [provider]  doxycycline  (ADOXA) 100 MG tablet Take 100 mg by mouth daily. 10/24/22  Yes [provider]  famotidine  (PEPCID ) 20 MG tablet Take 20 mg by mouth daily. 04/14/23  Yes [provider]  furosemide  (LASIX ) 20 MG tablet Take 1 tablet (20 mg total) by mouth daily. 09/03/23 09/02/24 Yes Wouk, Devaughn Sayres, MD  gabapentin   (NEURONTIN ) 300 MG capsule Take 600 mg by mouth 3 (three) times daily.   Yes [provider]  guaiFENesin  (MUCINEX ) 600 MG 12 hr tablet Take 600 mg by mouth every 12 (twelve) hours as needed for cough. 04/14/23  Yes [provider]  hydrOXYzine  (ATARAX ) 25 MG tablet Take 25 mg by mouth 2 (two) times daily as needed for anxiety. 12/28/22 10/17/23 Yes [provider]  Ipratropium-Albuterol  (COMBIVENT  RESPIMAT) 20-100 MCG/ACT AERS respimat Inhale 1 puff into the lungs 4 (four) times daily.   Yes [provider]  lisinopril  (ZESTRIL ) 40 MG tablet Take 1 tablet by mouth daily. 04/21/23  Yes [provider]  rivaroxaban  (XARELTO ) 20 MG TABS tablet Take 20 mg by mouth daily with supper. Last dose was taken at 20:30 pm 09/11/2023.   Yes [provider]  traZODone  (DESYREL ) 50 MG tablet Take 1-3 tablets by mouth at bedtime. Take 2 tablets at night   Yes [provider]  docusate sodium  (COLACE) 50 MG capsule Take 50 mg by mouth daily as needed for mild constipation. Per son, patient has this medication but doesn't use often    [provider]      Allergies    Codeine and Penicillin g    Review of Systems   Review of Systems  Respiratory:  Positive for shortness of breath.  Physical Exam Updated Vital Signs BP 118/72   Pulse 91   Temp (!) 97.3 F (36.3 C) (Oral)   Resp 18   Ht 6' (1.829 m)   Wt 65.9 kg   SpO2 100%   BMI 19.70 kg/m  Physical Exam Vitals and nursing note reviewed.  Constitutional:      Appearance: He is well-developed.  HENT:     Head: Normocephalic and atraumatic.  Eyes:     Pupils: Pupils are equal, round, and reactive to light.  Neck:     Vascular: No JVD.  Cardiovascular:     Rate and Rhythm: Normal rate and regular rhythm.     Heart sounds: No murmur heard.    No friction rub. No gallop.  Pulmonary:     Effort: No respiratory distress.     Breath sounds: Wheezing present.     Comments:  Diminished breath sounds in all fields with end expiratory wheezes and prolonged expiratory effort Abdominal:     General: There is no distension.     Tenderness: There is no abdominal tenderness. There is no guarding or rebound.  Musculoskeletal:        General: Normal range of motion.     Cervical back: Normal range of motion and neck supple.  Skin:    Coloration: Skin is not pale.     Findings: No rash.  Neurological:     Mental Status: He is alert and oriented to person, place, and time.  Psychiatric:        Behavior: Behavior normal.     ED Results / Procedures / Treatments   Labs (all labs ordered are listed, but only abnormal results are displayed) Labs Reviewed  RESP PANEL BY RT-PCR (RSV, FLU A&B, COVID)  RVPGX2 - Abnormal; Notable for the following components:      Result Value   Influenza A by PCR POSITIVE (*)    All other components within normal limits  CBC WITH DIFFERENTIAL/PLATELET - Abnormal; Notable for the following components:   RBC 3.69 (*)    Hemoglobin 10.6 (*)    HCT 32.8 (*)    RDW 15.6 (*)    Neutro Abs 8.5 (*)    Abs Immature Granulocytes 0.09 (*)    All other components within normal limits  TROPONIN I (HIGH SENSITIVITY) - Abnormal; Notable for the following components:   Troponin I (High Sensitivity) 2,877 (*)    All other components within normal limits  BRAIN NATRIURETIC PEPTIDE  COMPREHENSIVE METABOLIC PANEL  HEPARIN  LEVEL (UNFRACTIONATED)  APTT  I-STAT CHEM 8, ED    EKG None  Radiology DG Chest Port 1 View Result Date: 09/12/2023 CLINICAL DATA:  Shortness of breath. EXAM: PORTABLE CHEST 1 VIEW COMPARISON:  09/01/2023 FINDINGS: Stable asymmetric elevation right hemidiaphragm. Near complete resolution of pleural effusions seen previously. Interstitial markings are diffusely coarsened with chronic features. Chronic atelectasis or scarring noted right base. No focal consolidation or pleural effusion. Cardiopericardial silhouette is at upper  limits of normal for size. Bones are diffusely demineralized. IMPRESSION: Chronic interstitial coarsening without acute cardiopulmonary findings. Pleural effusions seen previously have essentially resolved. Electronically Signed   By: Camellia Candle M.D.   On: 09/12/2023 13:27    Procedures .Critical Care  Performed by: Emil Share, DO Authorized by: Emil Share, DO   Critical care provider statement:    Critical care time (minutes):  35   Critical care time was exclusive of:  Separately billable procedures and treating other patients   Critical care  was time spent personally by me on the following activities:  Development of treatment plan with patient or surrogate, discussions with consultants, evaluation of patient's response to treatment, examination of patient, ordering and review of laboratory studies, ordering and review of radiographic studies, ordering and performing treatments and interventions, pulse oximetry, re-evaluation of patient's condition and review of old charts   Care discussed with: admitting provider       Medications Ordered in ED Medications  aspirin  chewable tablet 324 mg (324 mg Oral Not Given 09/12/23 1431)  heparin  ADULT infusion 100 units/mL (25000 units/250mL) (has no administration in time range)  ipratropium-albuterol  (DUONEB) 0.5-2.5 (3) MG/3ML nebulizer solution 3 mL (3 mLs Nebulization Given 09/12/23 1327)  ondansetron  (ZOFRAN ) injection 4 mg (4 mg Intravenous Given 09/12/23 1319)    ED Course/ Medical Decision Making/ A&P                                 Medical Decision Making Amount and/or Complexity of Data Reviewed Labs: ordered. Radiology: ordered.  Risk OTC drugs. Prescription drug management. Decision regarding hospitalization.   78 yo M with a chief complaint of difficulty breathing.  By history and exam it sounds most like a COPD exacerbation.  He was however just admitted to [facility] regional and diagnosed with a CHF exacerbation.  He  thinks it feels similar to when he was admitted there.  Will obtain a laboratory evaluation chest x-ray screen for COVID flu and RSV.  Give DuoNebs reassess.  Patient feeling much better on reasessment.   Discussed with Dr. Okey from cardiology.  Will formally consult.   Discussed with medicine for admission.   The patients results and plan were reviewed and discussed.   Any x-rays performed were independently reviewed by myself.   Differential diagnosis were considered with the presenting HPI.  Medications  aspirin  chewable tablet 324 mg (324 mg Oral Not Given 09/12/23 1431)  heparin  ADULT infusion 100 units/mL (25000 units/250mL) (has no administration in time range)  ipratropium-albuterol  (DUONEB) 0.5-2.5 (3) MG/3ML nebulizer solution 3 mL (3 mLs Nebulization Given 09/12/23 1327)  ondansetron  (ZOFRAN ) injection 4 mg (4 mg Intravenous Given 09/12/23 1319)    Vitals:   09/12/23 1259 09/12/23 1307 09/12/23 1400  BP:  129/71 118/72  Pulse:  96 91  Resp:  20 18  Temp:  (!) 97.3 F (36.3 C)   TempSrc:  Oral   SpO2:  99% 100%  Weight: 65.9 kg    Height: 6' (1.829 m)      Final diagnoses:  NSTEMI (non-ST elevated myocardial infarction) (HCC)  COPD exacerbation (HCC)    Admission/ observation were discussed with the admitting physician, patient and/or family and they are comfortable with the plan.          Final Clinical Impression(s) / ED Diagnoses Final diagnoses:  NSTEMI (non-ST elevated myocardial infarction) (HCC)  COPD exacerbation [facility])    Rx / DC Orders ED Discharge Orders     None         Emil Share, DO 09/12/23 1528

## 2023-09-13 ENCOUNTER — Inpatient Hospital Stay (HOSPITAL_COMMUNITY): Payer: No Typology Code available for payment source

## 2023-09-13 ENCOUNTER — Other Ambulatory Visit (HOSPITAL_COMMUNITY): Payer: Self-pay

## 2023-09-13 ENCOUNTER — Encounter (HOSPITAL_COMMUNITY): Payer: Self-pay | Admitting: Student

## 2023-09-13 DIAGNOSIS — I214 Non-ST elevation (NSTEMI) myocardial infarction: Secondary | ICD-10-CM

## 2023-09-13 LAB — HEPARIN LEVEL (UNFRACTIONATED)
Heparin Unfractionated: 1.09 [IU]/mL — ABNORMAL HIGH (ref 0.30–0.70)
Heparin Unfractionated: 0.8 [IU]/mL — ABNORMAL HIGH (ref 0.30–0.70)

## 2023-09-13 LAB — LIPID PANEL
Cholesterol: 123 mg/dL (ref 0–200)
HDL: 53 mg/dL (ref >40)
LDL Cholesterol: 48 mg/dL (ref 0–99)
Total CHOL/HDL Ratio: 2.3 {ratio}
Triglycerides: 112 mg/dL (ref <150)
VLDL: 22 mg/dL (ref 0–40)

## 2023-09-13 LAB — CBC
HCT: 33.6 % — ABNORMAL LOW (ref 39.0–52.0)
Hemoglobin: 10.4 g/dL — ABNORMAL LOW (ref 13.0–17.0)
MCH: 28.1 pg (ref 26.0–34.0)
MCHC: 31 g/dL (ref 30.0–36.0)
MCV: 90.8 fL (ref 80.0–100.0)
Platelets: 165 10*3/uL (ref 150–400)
RBC: 3.7 MIL/uL — ABNORMAL LOW (ref 4.22–5.81)
RDW: 15.4 % (ref 11.5–15.5)
WBC: 10.1 10*3/uL (ref 4.0–10.5)
nRBC: 0.2 % (ref 0.0–0.2)

## 2023-09-13 LAB — APTT
aPTT: 71 s — ABNORMAL HIGH (ref 24–36)
aPTT: 78 s — ABNORMAL HIGH (ref 24–36)

## 2023-09-13 LAB — GLUCOSE, CAPILLARY
Glucose-Capillary: 112 mg/dL — ABNORMAL HIGH (ref 70–99)
Glucose-Capillary: 103 mg/dL — ABNORMAL HIGH (ref 70–99)
Glucose-Capillary: 144 mg/dL — ABNORMAL HIGH (ref 70–99)
Glucose-Capillary: 119 mg/dL — ABNORMAL HIGH (ref 70–99)

## 2023-09-13 LAB — BASIC METABOLIC PANEL
Anion gap: 10 (ref 5–15)
BUN: 53 mg/dL — ABNORMAL HIGH (ref 8–23)
CO2: 27 mmol/L (ref 22–32)
Calcium: 8.3 mg/dL — ABNORMAL LOW (ref 8.9–10.3)
Chloride: 100 mmol/L (ref 98–111)
Creatinine, Ser: 1.5 mg/dL — ABNORMAL HIGH (ref 0.61–1.24)
GFR, Estimated: 47 mL/min — ABNORMAL LOW (ref >60)
Glucose, Bld: 135 mg/dL — ABNORMAL HIGH (ref 70–99)
Potassium: 3.8 mmol/L (ref 3.5–5.1)
Sodium: 137 mmol/L (ref 135–145)

## 2023-09-13 LAB — ECHOCARDIOGRAM LIMITED
AR max vel: 2.92 cm2
AV Peak grad: 3.8 mm[Hg]
Ao pk vel: 0.98 m/s
Area-P 1/2: 5.97 cm2
Height: 72 in
S' Lateral: 5 cm
Weight: 2297.6 [oz_av]

## 2023-09-13 LAB — MAGNESIUM: Magnesium: 2.2 mg/dL (ref 1.7–2.4)

## 2023-09-13 LAB — PROTIME-INR
INR: 1.2 (ref 0.8–1.2)
Prothrombin Time: 15.3 s — ABNORMAL HIGH (ref 11.4–15.2)

## 2023-09-13 LAB — TROPONIN I (HIGH SENSITIVITY): Troponin I (High Sensitivity): 3482 ng/L (ref <18)

## 2023-09-13 MED ORDER — FUROSEMIDE 10 MG/ML IJ SOLN
40.0000 mg | Freq: Once | INTRAMUSCULAR | Status: AC
  Administered 2023-09-13: 40 mg via INTRAVENOUS
  Filled 2023-09-13: qty 4

## 2023-09-13 MED ORDER — ADULT MULTIVITAMIN W/MINERALS CH
1.0000 | ORAL_TABLET | Freq: Every day | ORAL | Status: DC
Start: 2023-09-13 — End: 2023-09-16
  Administered 2023-09-13 – 2023-09-16 (×4): 1 via ORAL
  Filled 2023-09-13 (×4): qty 1

## 2023-09-13 MED ORDER — ENSURE ENLIVE PO LIQD
237.0000 mL | Freq: Two times a day (BID) | ORAL | Status: DC
  Administered 2023-09-14 – 2023-09-16 (×5): 237 mL via ORAL

## 2023-09-13 MED ORDER — FUROSEMIDE 10 MG/ML IJ SOLN: 40.0000 mg | Freq: Every day | INTRAMUSCULAR | Status: DC

## 2023-09-13 MED ORDER — CLOPIDOGREL BISULFATE 75 MG PO TABS
75.0000 mg | ORAL_TABLET | Freq: Every day | ORAL | Status: DC
  Administered 2023-09-13 – 2023-09-16 (×4): 75 mg via ORAL
  Filled 2023-09-13 (×4): qty 1

## 2023-09-13 NOTE — Progress Notes (Signed)
 OT Cancellation Note  Patient Details Name: Richard Rivers MRN: [id number] DOB: [birth year]   Cancelled Treatment:    Reason Eval/Treat Not Completed: Other (comment) Working on breakfast tray at time of OT attempt. Will follow up later as schedule permits.  Rozelia Catapano  Britt 09/13/2023, 9:18 AM

## 2023-09-13 NOTE — Evaluation (Signed)
 Physical Therapy Evaluation Patient Details Name: Richard Rivers MRN: [id number] DOB: 06-19-1946 Today's Date: 09/13/2023  History of Present Illness  78 y/o male presented to ED on 09/01/23 for worsening SOB. Admitted for acute CHF exacerbation. PMH: CAD with stent placement, HTN, HLD, gout, depression, anxiety, tobacco abuse   Clinical Impression  PRINCE COUEY is 78 y.o. male admitted with above HPI and diagnosis. Patient is currently limited by functional impairments below (see PT problem list). Patient lives with son and is mod ind with RW for mobility at baseline. Currently pt requires supervision for bed mob, CGA for safety with transfers and gait with RW. Pt amb ~50' with no overt LOB, trunk slightly flexed and decreased foot clearance noted with bil LE. HR in 110's-120's with mobility and recovered to 80-90's resting. SpO2 96% on 2L/min wit gait. Patient will benefit from continued skilled PT interventions to address impairments and progress independence with mobility. Acute PT will follow and progress as able.       If plan is discharge home, recommend the following: A little help with walking and/or transfers;A little help with bathing/dressing/bathroom;Assistance with cooking/housework;Help with stairs or ramp for entrance   Can travel by private vehicle        Equipment Recommendations Rolling walker (2 wheels)  Recommendations for Other Services       Functional Status Assessment Patient has had a recent decline in their functional status and demonstrates the ability to make significant improvements in function in a reasonable and predictable amount of time.     Precautions / Restrictions Precautions Precautions: Fall Precaution Comments: watch O2 Restrictions Weight Bearing Restrictions Per Provider Order: No      Mobility  Bed Mobility Overal bed mobility: Modified Independent                  Transfers Overall transfer level: Needs  assistance Equipment used: Rolling walker (2 wheels) Transfers: Sit to/from Stand Sit to Stand: Min assist           General transfer comment: Light initial steadying assist    Ambulation/Gait Ambulation/Gait assistance: Contact guard assist Gait Distance (Feet): 50 Feet Assistive device: Rolling walker (2 wheels) Gait Pattern/deviations: Step-through pattern, Decreased stride length Gait velocity: decr        Stairs            Wheelchair Mobility     Tilt Bed    Modified Rankin (Stroke Patients Only)       Balance Overall balance assessment: Needs assistance Sitting-balance support: Single extremity supported, Bilateral upper extremity supported, Feet supported Sitting balance-[name]: Good     Standing balance support: Bilateral upper extremity supported, No upper extremity supported, During functional activity Standing balance-[name]: Poor                               Pertinent Vitals/Pain      Home Living Family/patient expects to be discharged to:: Private residence Living Arrangements: Children Available Help at Discharge: Family;Friend(s);Available 24 hours/day Type of Home: House Home Access: Ramped entrance       Home Layout: One level Home Equipment: Wheelchair - power;Rollator (4 wheels);Grab bars - toilet;Grab bars - tub/shower;Shower seat - built in      Prior Function Prior Level of Function : Needs assist;History of Falls (last six months)             Mobility Comments: uses rollator in the home and  to get into the [place]. Uses power chair in the community ADLs Comments: Manages ADLs including showering though stepson present for supervision/safety. [name] manages IADLs including med mgmt. No O2 use at home.     Extremity/Trunk Assessment   Upper Extremity Assessment Upper Extremity Assessment: Defer to OT evaluation    Lower Extremity Assessment Lower Extremity Assessment: Generalized weakness     Cervical / Trunk Assessment Cervical / Trunk Assessment: Normal  Communication   Communication Communication: No apparent difficulties Cueing Techniques: Verbal cues  Cognition Arousal: Alert Behavior During Therapy: WFL for tasks assessed/performed Overall Cognitive Status: Within Functional Limits for tasks assessed                                          General Comments General comments (skin integrity, edema, etc.): O2 96% on 2 LO2    Exercises     Assessment/Plan    PT Assessment Patient needs continued PT services  PT Problem List Decreased strength;Decreased balance;Decreased activity tolerance;Decreased mobility;Cardiopulmonary status limiting activity       PT Treatment Interventions Gait training;DME instruction;Functional mobility training;Therapeutic activities;Therapeutic exercise;Patient/family education    PT Goals (Current goals can be found in the Care Plan section)  Acute Rehab PT Goals PT Goal Formulation: With patient Time For Goal Achievement: 09/27/23 Potential to Achieve Goals: Good    Frequency Min 1X/week     Co-evaluation   Reason for Co-Treatment: To address functional/ADL transfers;Other (comment) (due to endurance deficits)   OT goals addressed during session: ADL's and self-care;Strengthening/ROM;Proper use of Adaptive equipment and DME       AM-PAC PT 6 Clicks Mobility  Outcome Measure Help needed turning from your back to your side while in a flat bed without using bedrails?: A Little Help needed moving from lying on your back to sitting on the side of a flat bed without using bedrails?: A Little Help needed moving to and from a bed to a chair (including a wheelchair)?: A Little Help needed standing up from a chair using your arms (e.g., wheelchair or bedside chair)?: A Little Help needed to walk in hospital room?: A Little Help needed climbing 3-5 steps with a railing? : A Little 6 Click Score: 18    End  of Session Equipment Utilized During Treatment: Gait belt;Oxygen Activity Tolerance: Patient tolerated treatment well Patient left: in bed;with bed alarm set;with call bell/phone within reach Nurse Communication: Mobility status PT Visit Diagnosis: Unsteadiness on feet (R26.81);Muscle weakness (generalized) (M62.81);Other abnormalities of gait and mobility (R26.89)    Time: 8851-8788 PT Time Calculation (min) (ACUTE ONLY): 23 min   Charges:   PT Evaluation $PT Eval Moderate Complexity: 1 Mod   PT General Charges $$ ACUTE PT VISIT: 1 Visit         Vernell DONEEN KLEIN, DPT Acute Rehabilitation Services Office [phone]  09/13/23 1:17 PM

## 2023-09-13 NOTE — Plan of Care (Signed)
  Problem: Education: Goal: Understanding of cardiac disease, CV risk reduction, and recovery process will improve Outcome: Progressing Goal: Individualized Educational Video(s) Outcome: Progressing   Problem: Activity: Goal: Ability to tolerate increased activity will improve Outcome: Progressing   Problem: Cardiac: Goal: Ability to achieve and maintain adequate cardiovascular perfusion will improve Outcome: Progressing   Problem: Health Behavior/Discharge Planning: Goal: Ability to safely manage health-related needs after discharge will improve Outcome: Progressing   Problem: Education: Goal: Ability to describe self-care measures that may prevent or decrease complications (Diabetes Survival Skills Education) will improve Outcome: Progressing Goal: Individualized Educational Video(s) Outcome: Progressing   Problem: Coping: Goal: Ability to adjust to condition or change in health will improve Outcome: Progressing   Problem: Fluid Volume: Goal: Ability to maintain a balanced intake and output will improve Outcome: Progressing   Problem: Health Behavior/Discharge Planning: Goal: Ability to identify and utilize available resources and services will improve Outcome: Progressing Goal: Ability to manage health-related needs will improve Outcome: Progressing   Problem: Metabolic: Goal: Ability to maintain appropriate glucose levels will improve Outcome: Progressing   Problem: Nutritional: Goal: Maintenance of adequate nutrition will improve Outcome: Progressing Goal: Progress toward achieving an optimal weight will improve Outcome: Progressing   Problem: Skin Integrity: Goal: Risk for impaired skin integrity will decrease Outcome: Progressing   Problem: Tissue Perfusion: Goal: Adequacy of tissue perfusion will improve Outcome: Progressing   Problem: Education: Goal: Knowledge of General Education information will improve Description: Including pain rating scale,  medication(s)/side effects and non-pharmacologic comfort measures Outcome: Progressing   Problem: Health Behavior/Discharge Planning: Goal: Ability to manage health-related needs will improve Outcome: Progressing   Problem: Clinical Measurements: Goal: Ability to maintain clinical measurements within normal limits will improve Outcome: Progressing Goal: Will remain free from infection Outcome: Progressing Goal: Diagnostic test results will improve Outcome: Progressing Goal: Respiratory complications will improve Outcome: Progressing Goal: Cardiovascular complication will be avoided Outcome: Progressing   Problem: Activity: Goal: Risk for activity intolerance will decrease Outcome: Progressing   Problem: Nutrition: Goal: Adequate nutrition will be maintained Outcome: Progressing   Problem: Coping: Goal: Level of anxiety will decrease Outcome: Progressing   Problem: Elimination: Goal: Will not experience complications related to bowel motility Outcome: Progressing Goal: Will not experience complications related to urinary retention Outcome: Progressing   Problem: Pain Management: Goal: General experience of comfort will improve Outcome: Progressing   Problem: Safety: Goal: Ability to remain free from injury will improve Outcome: Progressing   Problem: Skin Integrity: Goal: Risk for impaired skin integrity will decrease Outcome: Progressing   Problem: Education: Goal: Knowledge of disease or condition will improve Outcome: Progressing Goal: Knowledge of the prescribed therapeutic regimen will improve Outcome: Progressing Goal: Individualized Educational Video(s) Outcome: Progressing   Problem: Activity: Goal: Ability to tolerate increased activity will improve Outcome: Progressing Goal: Will verbalize the importance of balancing activity with adequate rest periods Outcome: Progressing   Problem: Respiratory: Goal: Ability to maintain a clear airway will  improve Outcome: Progressing Goal: Levels of oxygenation will improve Outcome: Progressing Goal: Ability to maintain adequate ventilation will improve Outcome: Progressing

## 2023-09-13 NOTE — Progress Notes (Signed)
 Heart Failure Navigator Progress Note  Assessed for Heart & Vascular TOC clinic readiness.  Patient declined Hf TOC appointment, reported he has a upcoming appointment with the cardiologist at the [facility] in about 2 weeks. Patient reported to smoking around 3 packs of cigarettes per day, drinks around 45 oz of water  and his step son ( Care giver) does the cooking and he uses salt frequently. Navigator educated patient on the sign and symptoms of heart failure, daily weights, when to call his doctor or go to the ED, Diet/ fluid restrictions, taking all his medications as prescribed, Step son does patients pill boxes weekly, he reported that he usually takes all his medications and gets them filled thru the [place]. Continued education on taking them all as prescribed and attending all medical appointments, patient verbalized his understanding of education. .   Navigator will sign off at this time.   Stephane Haddock, BSN, Scientist, Clinical (histocompatibility And Immunogenetics) Only

## 2023-09-13 NOTE — Progress Notes (Signed)
 Heart Failure Stewardship Pharmacist Progress Note   PCP: System, Provider Not In PCP-Cardiologist: None    HPI:  78 yo M with PMH hypertension, hyperlipidemia, CAD s/p complex PCI in 2023, gout, depression, anxiety, tobacco use disorder, PAD s/p bypass in 2021 and 2023, HFrEF, COPD.   Presented to the ED on 1/12 with SOB, chest pain, and diarrhea. Was recently discharged from [facility] [place] on 1/3 at which time he was found to have new HFrEF and was treated for CHF and COPD exacerbations. ECHO on 1/3 showed pulmonary HTN, EF 35-40%, global hypokinesis, LV mildly dilated, G2DD, RV function low normal, RV mildly enlarged, mildly increased RV wall thickness, severely elevated PASP, left atria mild to moderately dilated, right atria mild to moderately dilated, mild MR, mild to moderate TR. ECHO done this admission largely unchanged, but noted some improvements including RV function and size normal and moderately elevated PASP. In the ED, flu positive, BNP elevated to 2799, trop 2877->3859->3482, and AKI, found to have NSTEMI. Due to AKI and risk of contrast-induced nephropathy, plan for medical management. Started on nitroglycerin  for chest pain. SOB with minimal activity.  Patient reports feeling okay today. He states SOB has improved with Lasix , but he is still unable to lay flat. No LEE. States chest pain is improving. Reports that he gets all his medications for free from the [facility] for $0 copays.  Current HF Medications: Diuretic: furosemide  40 mg IV once on 1/12 and 1/13  Beta Blocker: carvedilol  12.5 mg BID  Prior to admission HF Medications: Diuretic: furosemide  20 mg daily Beta blocker: carvedilol  12.5 mg BID ACE/ARB/ARNI: lisinopril  40 mg daily  Pertinent Lab Values: Serum creatinine 1.50, BUN 53, Potassium 3.8, Sodium 137, BNP 2799, Magnesium  2.2,  From 1/2: A1c 6.0  Vital Signs: Weight: 143 lbs (admission weight: 145 lbs) Blood pressure: 118-144/70-98  Heart rate: 80-90s  I/O: net -0.17  L yesterday; net -0.5 L since admission  Medication Assistance / Insurance Benefits Check: Does the patient have prescription insurance?  Yes Type of insurance plan: [employer], [facility]  Outpatient Pharmacy:  [facility] Is the patient willing to use [facility] pharmacy at discharge? No Is the patient willing to transition their outpatient pharmacy to utilize a [facility] outpatient pharmacy?   No    Assessment: 1. Acute on chronic systolic HFrEF (LVEF 35-40%), due to ICM. NYHA class III symptoms. - Continue carvedilol  12.5 mg BID  - Continue assessing need for Lasix  daily. Agree with one time dose today given his SOB and orthopnea. AKI and BUN trending up so continue to dose as needed instead of scheduled. - Continue to hold lisinopril  for AKI - Consider addition of Jardiance  prior to discharge once kidney function is improving. [facility] only [facility] Jardiance  25 mg tablets on formulary so if initiated would need to write discharge prescription for 1/2 tablet (12.5 mg) daily of 25 mg tablets.   Plan: 1) Medication changes recommended at this time: - No changes at this time - Recheck Mg tomorrow given hx of hypomagnesemia during last hospitalization   2) Patient assistance: - Patient gets all medications covered from the [facility] and would like to get all his discharge prescriptions from there given that he normally has $0 copays   3)  Education  - Initial education completed, full education to be completed prior to discharge   Richard Rivers, PharmD PGY-1 Pharmacy Resident  Richard Rivers, PharmD, BCPS Heart Failure Stewardship Pharmacist Phone [phone]
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
## 2023-09-13 NOTE — Plan of Care (Signed)
  Problem: Education: Goal: Understanding of cardiac disease, CV risk reduction, and recovery process will improve Outcome: Progressing Goal: Individualized Educational Video(s) Outcome: Progressing   Problem: Activity: Goal: Ability to tolerate increased activity will improve Outcome: Progressing   Problem: Cardiac: Goal: Ability to achieve and maintain adequate cardiovascular perfusion will improve Outcome: Progressing   Problem: Health Behavior/Discharge Planning: Goal: Ability to safely manage health-related needs after discharge will improve Outcome: Progressing   Problem: Education: Goal: Ability to describe self-care measures that may prevent or decrease complications (Diabetes Survival Skills Education) will improve Outcome: Progressing Goal: Individualized Educational Video(s) Outcome: Progressing   Problem: Coping: Goal: Ability to adjust to condition or change in health will improve Outcome: Progressing   Problem: Fluid Volume: Goal: Ability to maintain a balanced intake and output will improve Outcome: Progressing   Problem: Health Behavior/Discharge Planning: Goal: Ability to identify and utilize available resources and services will improve Outcome: Progressing Goal: Ability to manage health-related needs will improve Outcome: Progressing   Problem: Metabolic: Goal: Ability to maintain appropriate glucose levels will improve Outcome: Progressing   Problem: Nutritional: Goal: Maintenance of adequate nutrition will improve Outcome: Progressing Goal: Progress toward achieving an optimal weight will improve Outcome: Progressing   Problem: Tissue Perfusion: Goal: Adequacy of tissue perfusion will improve Outcome: Progressing   Problem: Education: Goal: Knowledge of General Education information will improve Description: Including pain rating scale, medication(s)/side effects and non-pharmacologic comfort measures Outcome: Progressing   Problem: Health  Behavior/Discharge Planning: Goal: Ability to manage health-related needs will improve Outcome: Progressing   Problem: Clinical Measurements: Goal: Ability to maintain clinical measurements within normal limits will improve Outcome: Progressing Goal: Will remain free from infection Outcome: Progressing Goal: Diagnostic test results will improve Outcome: Progressing Goal: Respiratory complications will improve Outcome: Progressing Goal: Cardiovascular complication will be avoided Outcome: Progressing   Problem: Activity: Goal: Risk for activity intolerance will decrease Outcome: Progressing   Problem: Nutrition: Goal: Adequate nutrition will be maintained Outcome: Progressing   Problem: Coping: Goal: Level of anxiety will decrease Outcome: Progressing   Problem: Elimination: Goal: Will not experience complications related to bowel motility Outcome: Progressing Goal: Will not experience complications related to urinary retention Outcome: Progressing   Problem: Pain Management: Goal: General experience of comfort will improve Outcome: Progressing   Problem: Safety: Goal: Ability to remain free from injury will improve Outcome: Progressing   Problem: Skin Integrity: Goal: Risk for impaired skin integrity will decrease Outcome: Progressing   Problem: Education: Goal: Knowledge of disease or condition will improve Outcome: Progressing Goal: Knowledge of the prescribed therapeutic regimen will improve Outcome: Progressing Goal: Individualized Educational Video(s) Outcome: Progressing   Problem: Activity: Goal: Ability to tolerate increased activity will improve Outcome: Progressing Goal: Will verbalize the importance of balancing activity with adequate rest periods Outcome: Progressing   Problem: Respiratory: Goal: Ability to maintain a clear airway will improve Outcome: Progressing Goal: Levels of oxygenation will improve Outcome: Progressing Goal: Ability  to maintain adequate ventilation will improve Outcome: Progressing
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
## 2023-09-13 NOTE — Progress Notes (Signed)
 PROGRESS NOTE    Richard Rivers  FMW:[id number] DOB: [birth year] DOA: 09/12/2023 PCP: System, Provider Not In    Brief Narrative:  78 year old gentleman with history of hypertension, hyperlipidemia, coronary artery disease, history of, depression anxiety and smoker, COPD presented with about 4 days of shortness of breath, chest pain and diarrhea.  In the emergency room 99% on 2 L oxygen.  Respiratory virus panel positive for influenza A.  BNP 2799.  Troponin is 2877.  Chest x-ray with chronic findings.  EKG with nonischemic EKG changes.  Cardiology was consulted, started on heparin  drip and admitted to the hospital.  Subjective: Patient seen and examined early morning rounds with cardiology team.  He has some dry cough.  Still has dull chest pain.  Short of breath on moving around.   Assessment & Plan:   Non-ST elevation MI: Currently stable.  Intermittent chest pain.  Patient declined to have cardiac cath and also recommended medical management. Limited echocardiogram similar to one from 09/03/2023.  EF 35 to 40%. Treating medically, patient currently on Plavix , heparin  infusion, nitroglycerin  infusion.  Already on high-dose statin.  Acute on chronic combined heart failure: Presented with shortness of breath with minimal activity.  Known EF of 35 to 40%.  Intermittently diuresing.  COPD with exacerbation secondary to influenza A infection: Tamiflu  for 5 days Solu-Medrol, followed by prednisone  for 1 week. scheduled and as needed bronchodilators, deep breathing exercises, incentive spirometry, chest physiotherapy and respiratory therapy consult. Supplemental oxygen to keep saturations more than 92%.  Mobilize with PT OT.  AKI: Likely prerenal.  Baseline creatinine 1-1.1.  Presentation creatinine with 1.7.  Stabilizing.  Will recheck tomorrow morning.  Essential hypertension: Blood pressure is stable on lisinopril  and Coreg .  Lisinopril  on hold.  Peripheral artery disease: Status post  right femoral peroneal bypass in 2023.  Patient on Xarelto  at home.  Holding Xarelto  on heparin .  Depression and anxiety: On trazodone  and BuSpar .  Smoker: Counseled to quit.  Nicotine  patch.     DVT prophylaxis: Heparin  infusion   Code Status: Full code Family Communication: No family at the bedside Disposition Plan: Status is: Inpatient Remains inpatient appropriate because: Actively treated for chest pain.  On vasoactive infusions     Consultants:  Cardiology  Procedures:  None  Antimicrobials:  Tamiflu  1/12----      Objective: Vitals:   09/12/23 2200 09/13/23 0359 09/13/23 0734 09/13/23 1220  BP: (!) 144/98 136/70  136/74  Pulse:  100  80  Resp:  20  16  Temp:  98.1 F (36.7 C)  97.6 F (36.4 C)  TempSrc:  Oral  Oral  SpO2:  98% 98% 98%  Weight:  65.1 kg    Height:        Intake/Output Summary (Last 24 hours) at 09/13/2023 1442 Last data filed at 09/13/2023 1221 Gross per 24 hour  Intake 110.44 ml  Output 975 ml  Net -864.56 ml   Filed Weights   09/12/23 1259 09/13/23 0359  Weight: 65.9 kg 65.1 kg    Examination:  General exam: Appears calm.  Pleasant.  In mild distress on mobility. Respiratory system: Clear to auscultation.  Mostly noted upper airway sounds. SpO2: 98 % O2 Flow Rate (L/min): 2 L/min  Cardiovascular system: S1 & S2 heard, RRR. No JVD, murmurs, rubs, gallops or clicks.  Gastrointestinal system: Soft.  Nontender.  Bowel sound present. Central nervous system: Alert and oriented. No focal neurological deficits.   Data Reviewed: I have personally reviewed following labs and  imaging studies  CBC: Recent Labs  Lab 09/12/23 1323 09/12/23 1539 09/13/23 0332  WBC 10.1  --  10.1  NEUTROABS 8.5*  --   --   HGB 10.6* 10.9* 10.4*  HCT 32.8* 32.0* 33.6*  MCV 88.9  --  90.8  PLT 208  --  165   Basic Metabolic Panel: Recent Labs  Lab 09/12/23 1533 09/12/23 1539 09/13/23 0332  NA 137 138 137  K 3.8 3.7 3.8  CL 99 102 100   CO2 25  --  27  GLUCOSE 125* 118* 135*  BUN 48* 44* 53*  CREATININE 1.71* 1.60* 1.50*  CALCIUM  8.4*  --  8.3*  MG  --   --  2.2   GFR: Estimated Creatinine Clearance: 37.4 mL/min (A) (by C-G formula based on SCr of 1.5 mg/dL (H)). Liver Function Tests: Recent Labs  Lab 09/12/23 1533  AST 91*  ALT 61*  ALKPHOS 84  BILITOT 0.7  PROT 5.9*  ALBUMIN 2.6*   No results for input(s): LIPASE, AMYLASE in the last 168 hours. No results for input(s): AMMONIA in the last 168 hours. Coagulation Profile: Recent Labs  Lab 09/13/23 0332  INR 1.2   Cardiac Enzymes: No results for input(s): CKTOTAL, CKMB, CKMBINDEX, TROPONINI in the last 168 hours. BNP (last 3 results) No results for input(s): PROBNP in the last 8760 hours. HbA1C: No results for input(s): HGBA1C in the last 72 hours. CBG: Recent Labs  Lab 09/12/23 2202 09/13/23 0724 09/13/23 1311  GLUCAP 132* 112* 103*   Lipid Profile: Recent Labs    09/13/23 0332  CHOL 123  HDL 53  LDLCALC 48  TRIG 112  CHOLHDL 2.3   Thyroid Function Tests: No results for input(s): TSH, T4TOTAL, FREET4, T3FREE, THYROIDAB in the last 72 hours. Anemia Panel: No results for input(s): VITAMINB12, FOLATE, FERRITIN, TIBC, IRON , RETICCTPCT in the last 72 hours. Sepsis Labs: No results for input(s): PROCALCITON, LATICACIDVEN in the last 168 hours.  Recent Results (from the past 240 hours)  Resp panel by RT-PCR (RSV, Flu A&B, Covid) Anterior Nasal Swab     Status: Abnormal   Collection Time: 09/12/23 12:59 PM   Specimen: Anterior Nasal Swab  Result Value Ref Range Status   SARS Coronavirus 2 by RT PCR NEGATIVE NEGATIVE Final   Influenza A by PCR POSITIVE (A) NEGATIVE Final   Influenza B by PCR NEGATIVE NEGATIVE Final    Comment: (NOTE) The Xpert Xpress SARS-CoV-2/FLU/RSV plus assay is intended as an aid in the diagnosis of influenza from Nasopharyngeal swab specimens and should not be used as a  sole basis for treatment. Nasal washings and aspirates are unacceptable for Xpert Xpress SARS-CoV-2/FLU/RSV testing.  Fact Sheet for Patients: bloggercourse.com  Fact Sheet for Healthcare Providers: seriousbroker.it  This test is not yet approved or cleared by the [place]  FDA and has been authorized for detection and/or diagnosis of SARS-CoV-2 by FDA under an Emergency Use Authorization (EUA). This EUA will remain in effect (meaning this test can be used) for the duration of the COVID-19 declaration under Section 564(b)(1) of the Act, 21 U.S.C. section 360bbb-3(b)(1), unless the authorization is terminated or revoked.     Resp Syncytial Virus by PCR NEGATIVE NEGATIVE Final    Comment: (NOTE) Fact Sheet for Patients: bloggercourse.com  Fact Sheet for Healthcare Providers: seriousbroker.it  This test is not yet approved or cleared by the [place]  FDA and has been authorized for detection and/or diagnosis of SARS-CoV-2 by FDA under an Emergency Use Authorization (  EUA). This EUA will remain in effect (meaning this test can be used) for the duration of the COVID-19 declaration under Section 564(b)(1) of the Act, 21 U.S.C. section 360bbb-3(b)(1), unless the authorization is terminated or revoked.  Performed at [facility], [address], [place], [place] [address]          Radiology Studies: ECHOCARDIOGRAM LIMITED Result Date: 09/13/2023    ECHOCARDIOGRAM LIMITED REPORT   Patient Name:   Richard Rivers Date of Exam: 09/13/2023 Medical Rec #:  [id number]        Height:       72.0 in Accession #:    [id number]       Weight:       143.6 lb Date of Birth:  1945-09-04        BSA:          1.851 m Patient Age:    35 years         BP:           136/70 mmHg Patient Gender: M                HR:           88 bpm. Exam Location:  Inpatient Procedure: Limited Echo and  Limited Color Doppler Indications:    Acute myocardial infarction, unspecified I21.9  History:        Patient has prior history of Echocardiogram examinations, most                 recent 09/03/2023. CHF, Previous Myocardial Infarction and CAD,                 COPD, PAD and Pulmonary HTN; Risk Factors:Dyslipidemia.  Sonographer:    Thea Norlander RCS Referring Phys: [id number] SAGAR H JINWALA IMPRESSIONS  1. Left ventricular ejection fraction, by estimation, is 35 to 40%. The left ventricle has moderately decreased function. The left ventricle demonstrates global hypokinesis. Left ventricular diastolic parameters are consistent with Grade II diastolic dysfunction (pseudonormalization).  2. Right ventricular systolic function is normal. The right ventricular size is normal. There is moderately elevated pulmonary artery systolic pressure. The estimated right ventricular systolic pressure is 45.8 mmHg.  3. The mitral valve is normal in structure. Mild to moderate mitral valve regurgitation.  4. Tricuspid valve regurgitation is moderate.  5. The aortic valve is tricuspid. Aortic valve regurgitation is not visualized.  6. The inferior vena cava is normal in size with greater than 50% respiratory variability, suggesting right atrial pressure of 3 mmHg. Comparison(s): No significant change from prior study. Conclusion(s)/Recommendation(s): Consider an ischemic evaluation. FINDINGS  Left Ventricle: Left ventricular ejection fraction, by estimation, is 35 to 40%. The left ventricle has moderately decreased function. The left ventricle demonstrates global hypokinesis. There is no left ventricular hypertrophy. Left ventricular diastolic parameters are consistent with Grade II diastolic dysfunction (pseudonormalization). Right Ventricle: The right ventricular size is normal. Right ventricular systolic function is normal. There is moderately elevated pulmonary artery systolic pressure. The tricuspid regurgitant velocity is 3.27  m/s, and with an assumed right atrial pressure of 3 mmHg, the estimated right ventricular systolic pressure is 45.8 mmHg. Pericardium: There is no evidence of pericardial effusion. Mitral Valve: The mitral valve is normal in structure. Mild to moderate mitral valve regurgitation. Tricuspid Valve: Tricuspid valve regurgitation is moderate. Aortic Valve: The aortic valve is tricuspid. Aortic valve regurgitation is not visualized. Aortic valve peak gradient measures 3.8 mmHg. Venous: The inferior vena cava is normal  in size with greater than 50% respiratory variability, suggesting right atrial pressure of 3 mmHg. LEFT VENTRICLE PLAX 2D LVIDd:         5.60 cm   Diastology LVIDs:         5.00 cm   LV e' medial:    4.58 cm/s LV PW:         1.00 cm   LV E/e' medial:  22.1 LV IVS:        0.90 cm   LV e' lateral:   9.83 cm/s LVOT diam:     2.00 cm   LV E/e' lateral: 10.3 LV SV:         49 LV SV Index:   26 LVOT Area:     3.14 cm  RIGHT VENTRICLE             IVC RV S prime:     14.40 cm/s  IVC diam: 1.20 cm TAPSE (M-mode): 1.6 cm LEFT ATRIUM         Index LA diam:    3.90 cm 2.11 cm/m  AORTIC VALVE AV Area (Vmax): 2.92 cm AV Vmax:        97.70 cm/s AV Peak Grad:   3.8 mmHg LVOT Vmax:      90.73 cm/s LVOT Vmean:     58.967 cm/s LVOT VTI:       0.155 m  AORTA Ao Root diam: 3.40 cm Ao Asc diam:  3.20 cm MITRAL VALVE                TRICUSPID VALVE MV Area (PHT): 5.97 cm     TR Peak grad:   42.8 mmHg MV Decel Time: 127 msec     TR Vmax:        327.00 cm/s MV E velocity: 101.00 cm/s MV A velocity: 51.90 cm/s   SHUNTS MV E/A ratio:  1.95         Systemic VTI:  0.15 m                             Systemic Diam: 2.00 cm Ronal Ross Electronically signed by Ronal Ross Signature Date/Time: 09/13/2023/9:41:36 AM    Final    DG Chest Port 1 View Result Date: 09/12/2023 CLINICAL DATA:  Shortness of breath. EXAM: PORTABLE CHEST 1 VIEW COMPARISON:  09/01/2023 FINDINGS: Stable asymmetric elevation right hemidiaphragm. Near complete  resolution of pleural effusions seen previously. Interstitial markings are diffusely coarsened with chronic features. Chronic atelectasis or scarring noted right base. No focal consolidation or pleural effusion. Cardiopericardial silhouette is at upper limits of normal for size. Bones are diffusely demineralized. IMPRESSION: Chronic interstitial coarsening without acute cardiopulmonary findings. Pleural effusions seen previously have essentially resolved. Electronically Signed   By: Camellia Candle M.D.   On: 09/12/2023 13:27        Scheduled Meds:  atorvastatin   80 mg Oral QHS   busPIRone   10 mg Oral BID   carvedilol   12.5 mg Oral BID WC   clopidogrel   75 mg Oral Daily   famotidine   20 mg Oral Daily   fluticasone  furoate-vilanterol  1 puff Inhalation Daily   insulin  aspart  0-9 Units Subcutaneous TID WC   nicotine   21 mg Transdermal Daily   oseltamivir   30 mg Oral BID   predniSONE   40 mg Oral Q breakfast   senna-docusate  1 tablet Oral BID   traZODone   100 mg Oral QHS  umeclidinium bromide   1 puff Inhalation Daily   Continuous Infusions:  heparin  1,000 Units/hr (09/13/23 0300)   nitroGLYCERIN  5 mcg/min (09/13/23 0300)     LOS: 1 day    Time spent: 40 minutes    Renato Applebaum, MD [facility]

## 2023-09-13 NOTE — Evaluation (Signed)
 Occupational Therapy Evaluation Patient Details Name: Richard Rivers MRN: [id number] DOB: 23-Aug-1946 Today's Date: 09/13/2023   History of Present Illness 78 y/o male presented to ED on 09/01/23 for worsening SOB. Admitted for acute CHF exacerbation. PMH: CAD with stent placement, HTN, HLD, gout, depression, anxiety, tobacco abuse   Clinical Impression   PTA, pt lives with stepson, typically ambulatory with Rollator and able to manage ADLs w/o assistance. Richard Rivers assists with IADLs. Pt presents now with deficits in cardiopulmonary endurance, dynamic standing balance and strength. Pt able to mobilize using RW with CGA and requires no more than CGA to manage ADLs. Provided initial energy conservation education w/ handout and recommended pt to obtain shower chair to maximize safety with ADL tasks at home. Recommend HHOT follow up at DC.   SpO2 96% on 2 L O2      If plan is discharge home, recommend the following: A little help with walking and/or transfers;A little help with bathing/dressing/bathroom;Assistance with cooking/housework;Assist for transportation;Help with stairs or ramp for entrance    Functional Status Assessment  Patient has had a recent decline in their functional status and demonstrates the ability to make significant improvements in function in a reasonable and predictable amount of time.  Equipment Recommendations  Tub/shower seat    Recommendations for Other Services       Precautions / Restrictions Precautions Precautions: Fall Precaution Comments: watch O2 Restrictions Weight Bearing Restrictions Per Provider Order: No      Mobility Bed Mobility Overal bed mobility: Modified Independent                  Transfers Overall transfer level: Needs assistance Equipment used: Rolling walker (2 wheels) Transfers: Sit to/from Stand Sit to Stand: Min assist           General transfer comment: Light initial steadying assist      Balance Overall  balance assessment: Needs assistance Sitting-balance support: Single extremity supported, Bilateral upper extremity supported, Feet supported Sitting balance-[name]: Good     Standing balance support: Bilateral upper extremity supported, No upper extremity supported, During functional activity Standing balance-[name]: Poor                             ADL either performed or assessed with clinical judgement   ADL Overall ADL's : Needs assistance/impaired Eating/Feeding: Independent   Grooming: Set up;Sitting   Upper Body Bathing: Set up;Sitting   Lower Body Bathing: Contact guard assist;Sitting/lateral leans;Sit to/from stand   Upper Body Dressing : Set up;Sitting   Lower Body Dressing: Contact guard assist;Sitting/lateral leans;Sit to/from stand Lower Body Dressing Details (indicate cue type and reason): able to don sketchers slip ins Toilet Transfer: Contact guard assist;Ambulation;Rolling walker (2 wheels)   Toileting- Clothing Manipulation and Hygiene: Contact guard assist;Sitting/lateral lean;Sit to/from stand       Functional mobility during ADLs: Contact guard assist;Rolling walker (2 wheels) General ADL Comments: Provided energy conservation education with handout, emphasis on obtaining shower chair for showering tasks as pt reports built in seat too small and far away from the water , also that it is too slippery to sit on     Vision Ability to See in Adequate Light: 0 Adequate Patient Visual Report: No change from baseline Vision Assessment?: No apparent visual deficits     Perception         Praxis         Pertinent Vitals/Pain Pain Assessment Pain Assessment: No/denies  pain     Extremity/Trunk Assessment Upper Extremity Assessment Upper Extremity Assessment: Generalized weakness;Right hand dominant   Lower Extremity Assessment Lower Extremity Assessment: Defer to PT evaluation   Cervical / Trunk Assessment Cervical / Trunk  Assessment: Normal   Communication Communication Communication: No apparent difficulties Cueing Techniques: Verbal cues   Cognition Arousal: Alert Behavior During Therapy: WFL for tasks assessed/performed Overall Cognitive Status: Within Functional Limits for tasks assessed                                       General Comments  O2 96% on 2 LO2    Exercises     Shoulder Instructions      Home Living Family/patient expects to be discharged to:: Private residence Living Arrangements: Children Available Help at Discharge: Family;Friend(s);Available 24 hours/day Type of Home: House Home Access: Ramped entrance     Home Layout: One level     Bathroom Shower/Tub: Producer, Television/film/video: Handicapped height     Home Equipment: Wheelchair - power;Rollator (4 wheels);Grab bars - toilet;Grab bars - tub/shower;Shower seat - built in          Prior Functioning/Environment Prior Level of Function : Needs assist;History of Falls (last six months)             Mobility Comments: uses rollator in the home and to get into the [place]. Uses power chair in the community ADLs Comments: Manages ADLs including showering though stepson present for supervision/safety. [name] manages IADLs including med mgmt. No O2 use at home.        OT Problem List: Cardiopulmonary status limiting activity;Decreased activity tolerance;Impaired balance (sitting and/or standing);Decreased knowledge of use of DME or AE;Decreased strength      OT Treatment/Interventions: Self-care/ADL training;Therapeutic exercise;Therapeutic activities;Energy conservation;DME and/or AE instruction;Patient/family education;Balance training    OT Goals(Current goals can be found in the care plan section) Acute Rehab OT Goals Patient Stated Goal: improve breathing OT Goal Formulation: With patient Time For Goal Achievement: 09/27/23 Potential to Achieve Goals: Good  OT Frequency: Min 1X/week     Co-evaluation PT/OT/SLP Co-Evaluation/Treatment: Yes Reason for Co-Treatment: To address functional/ADL transfers;Other (comment) (due to endurance deficits)   OT goals addressed during session: ADL's and self-care;Strengthening/ROM;Proper use of Adaptive equipment and DME      AM-PAC OT 6 Clicks Daily Activity     Outcome Measure Help from another person eating meals?: None Help from another person taking care of personal grooming?: A Little Help from another person toileting, which includes using toliet, bedpan, or urinal?: A Little Help from another person bathing (including washing, rinsing, drying)?: A Little Help from another person to put on and taking off regular upper body clothing?: A Little Help from another person to put on and taking off regular lower body clothing?: A Little 6 Click Score: 19   End of Session Equipment Utilized During Treatment: Gait belt;Rolling walker (2 wheels);Oxygen  Activity Tolerance: Patient tolerated treatment well Patient left: in bed;with call bell/phone within reach  OT Visit Diagnosis: Other abnormalities of gait and mobility (R26.89);History of falling (Z91.81)                Time: 8853-8792 OT Time Calculation (min): 21 min Charges:  OT General Charges $OT Visit: 1 Visit OT Evaluation $OT Eval Low Complexity: 1 Low  Mliss NOVAK, OTR/L Acute Rehab Services Office: [phone]   Mliss Getting 09/13/2023,  1:03 PM

## 2023-09-13 NOTE — Progress Notes (Signed)
 Echocardiogram 2D Echocardiogram has been performed.  Richard Rivers 09/13/2023, 9:10 AM

## 2023-09-13 NOTE — Progress Notes (Addendum)
 Rounding Note    Patient Name: Richard Rivers Date of Encounter: 09/13/2023  [facility] Cardiologist: has not seen Cards at the [facility] in a long time, has appt with them coming up  Dr Lavona here, new  Subjective   Still w/ CP 6/10, better than previous. Also breathing better than on admission, but was very SOB just after moving himself around in the bed  RN asked him about CP a short time later, he denies it.   Pt lives w/ family member who helps in his care.   Inpatient Medications    Scheduled Meds:  aspirin   324 mg Oral Once   atorvastatin   80 mg Oral QHS   busPIRone   10 mg Oral BID   carvedilol   12.5 mg Oral BID WC   famotidine   20 mg Oral Daily   fluticasone  furoate-vilanterol  1 puff Inhalation Daily   insulin  aspart  0-9 Units Subcutaneous TID WC   nicotine   21 mg Transdermal Daily   oseltamivir   30 mg Oral BID   predniSONE   40 mg Oral Q breakfast   senna-docusate  1 tablet Oral BID   traZODone   100 mg Oral QHS   umeclidinium bromide   1 puff Inhalation Daily   Continuous Infusions:  heparin  1,000 Units/hr (09/13/23 0300)   nitroGLYCERIN  5 mcg/min (09/13/23 0300)   PRN Meds: acetaminophen , hydrOXYzine , nitroGLYCERIN , ondansetron  (ZOFRAN ) IV, polyethylene glycol   Vital Signs    Vitals:   09/12/23 2029 09/12/23 2200 09/13/23 0359 09/13/23 0734  BP: (!) 144/98 (!) 144/98 136/70   Pulse: 82  100   Resp: 20  20   Temp: 99 F (37.2 C)  98.1 F (36.7 C)   TempSrc: Oral  Oral   SpO2: 96%  98% 98%  Weight:   65.1 kg   Height:        Intake/Output Summary (Last 24 hours) at 09/13/2023 0828 Last data filed at 09/13/2023 0403 Gross per 24 hour  Intake 110.44 ml  Output 675 ml  Net -564.56 ml      09/13/2023    3:59 AM 09/12/2023   12:59 PM 09/03/2023   10:54 AM  Last 3 Weights  Weight (lbs) 143 lb 9.6 oz 145 lb 4.5 oz 145 lb 3.2 oz  Weight (kg) 65.137 kg 65.9 kg 65.862 kg      Telemetry    SR, freq PVCs and pairs - Personally  Reviewed  ECG    None today - Personally Reviewed  Physical Exam   GEN: No acute distress.   Neck: No JVD seen  Cardiac: RRR, no murmurs, rubs, or gallops.  Respiratory: decreased BS bases bilaterally. No wheezing GI: Soft, nontender, non-distended  MS: No edema; No deformity. U/a to palpate pedal pulses, cap refill delayed Neuro:  Nonfocal  Psych: Normal affect   Labs    High Sensitivity Troponin:   Recent Labs  Lab 09/01/23 2018 09/02/23 0618 09/12/23 1323 09/12/23 2120 09/12/23 2310  TROPONINIHS 33* 57* 2,877* 3,859* 3,482*     Chemistry Recent Labs  Lab 09/12/23 1533 09/12/23 1539 09/13/23 0332  NA 137 138 137  K 3.8 3.7 3.8  CL 99 102 100  CO2 25  --  27  GLUCOSE 125* 118* 135*  BUN 48* 44* 53*  CREATININE 1.71* 1.60* 1.50*  CALCIUM  8.4*  --  8.3*  PROT 5.9*  --   --   ALBUMIN 2.6*  --   --   AST 91*  --   --  ALT 61*  --   --   ALKPHOS 84  --   --   BILITOT 0.7  --   --   GFRNONAA 40*  --  47*  ANIONGAP 13  --  10    Lipids  Recent Labs  Lab 09/13/23 0332  CHOL 123  TRIG 112  HDL 53  LDLCALC 48  CHOLHDL 2.3    Hematology Recent Labs  Lab 09/12/23 1323 09/12/23 1539 09/13/23 0332  WBC 10.1  --  10.1  RBC 3.69*  --  3.70*  HGB 10.6* 10.9* 10.4*  HCT 32.8* 32.0* 33.6*  MCV 88.9  --  90.8  MCH 28.7  --  28.1  MCHC 32.3  --  31.0  RDW 15.6*  --  15.4  PLT 208  --  165   Thyroid No results for input(s): TSH, FREET4 in the last 168 hours.  BNP   B Natriuretic Peptide  Date Value Ref Range Status  09/12/2023 2,799.0 (H) 0.0 - 100.0 pg/mL Final    Comment:    Performed at [facility], [address] [place], [place] [address]  09/01/2023 [phone] (H) 0.0 - 100.0 pg/mL Final    Comment:    Performed at [facility], [address] [place], [place] [address]    Radiology    DG Chest [place] 1 View Result Date: 09/12/2023 CLINICAL DATA:  Shortness of breath. EXAM: PORTABLE CHEST 1 VIEW COMPARISON:  09/01/2023  FINDINGS: Stable asymmetric elevation right hemidiaphragm. Near complete resolution of pleural effusions seen previously. Interstitial markings are diffusely coarsened with chronic features. Chronic atelectasis or scarring noted right base. No focal consolidation or pleural effusion. Cardiopericardial silhouette is at upper limits of normal for size. Bones are diffusely demineralized. IMPRESSION: Chronic interstitial coarsening without acute cardiopulmonary findings. Pleural effusions seen previously have essentially resolved. Electronically Signed   By: Camellia Candle M.D.   On: 09/12/2023 13:27    Cardiac Studies   LIMITED ECHO:   1. Left ventricular ejection fraction, by estimation, is 35 to 40%. The left ventricle has moderately decreased function. The left ventricle demonstrates global hypokinesis. Left ventricular diastolic parameters are consistent with Grade II diastolic  dysfunction (pseudonormalization).   2. Right ventricular systolic function is normal. The right ventricular size is normal. There is moderately elevated pulmonary artery systolic pressure. The estimated right ventricular systolic pressure is 45.8 mmHg.   3. The mitral valve is normal in structure. Mild to moderate mitral valve regurgitation.   4. Tricuspid valve regurgitation is moderate.   5. The aortic valve is tricuspid. Aortic valve regurgitation is not  visualized.   6. The inferior vena cava is normal in size with greater than 50% respiratory variability, suggesting right atrial pressure of 3 mmHg.    Patient Profile     78 y.o. male s/p complex PCI to his LM in 2023 at the [place]. He has known CTO of the RCA with L-R collaterals, mod nonobstructive CAD in the LAD and OM. He was admitted 2 weeks ago with a/c CHF exacerbation and noted to have newly depressed LVF with EF 35-40.   He was admitted 01/12 w/ SOB, BNP no sig change from previous, CP >> NSTEMI by ez.  Assessment & Plan    NSTEMI -  reported, then denied  CP, increase Nitro for CP, recheck ECG - he is still ok w/ med rx, he gets sig SOB w/ minimal activity - BUN/Cr above baseline on admit, BUN still trending up  so would not favor dye studies - limited echo today, EF no change from 01/03 - RVSP elevated at 45.8 today, PAS was 83.6 on 01/10 echo  2. CHF, acute on chronic combined - pt got Lasix  40 mg IV x 1 last pm - still SOB w/ minimal activity, some sx volume overload by exam - echo w/ EF 35-40%, RVSP 45.8 - however, w/ elevated BUN and Cr above baseline, will discuss management w/ MD  3. Dyslipidemia - lipids ok on Lipitor  80 mg every day  4. Anemia - H&H are lower than previous - no iron  studies in the system - mgt per IM  Otherwise, per IM  For questions or updates, please contact [facility] Please consult [email or web address] for contact info under        Signed, Shona Shad, PA-C  09/13/2023, 8:28 AM    History and all data above reviewed.  Patient examined.  I agree with the findings as above.  He has had some intermittent chest pain.  Also still not breathing at baselineThe patient exam reveals COR:RRR, distant heart sounds  ,  Lungs: Decreased breath sounds  ,  Abd: Positive bowel sounds, no rebound no guarding, Ext No edema  .  All available labs, radiology testing, previous records reviewed. Agree with documented assessment and plan. NSTEMI:  Given the complexity of his past history and chronic and acute illnesses he and I agree on conservative management without invasive evaluation.  Continue heparin  and IV NTG today.  I am going to give one dose of IV Lasix  to see if this might not help with both pain and dyspnea.    Lynwood Mahad Newstrom  11:49 AM  09/13/2023

## 2023-09-14 ENCOUNTER — Other Ambulatory Visit (HOSPITAL_COMMUNITY): Payer: Self-pay

## 2023-09-14 DIAGNOSIS — E440 Moderate protein-calorie malnutrition: Secondary | ICD-10-CM | POA: Insufficient documentation

## 2023-09-14 DIAGNOSIS — I214 Non-ST elevation (NSTEMI) myocardial infarction: Secondary | ICD-10-CM | POA: Diagnosis not present

## 2023-09-14 LAB — GLUCOSE, CAPILLARY
Glucose-Capillary: 91 mg/dL (ref 70–99)
Glucose-Capillary: 126 mg/dL — ABNORMAL HIGH (ref 70–99)
Glucose-Capillary: 167 mg/dL — ABNORMAL HIGH (ref 70–99)
Glucose-Capillary: 104 mg/dL — ABNORMAL HIGH (ref 70–99)

## 2023-09-14 LAB — CBC
HCT: 29.7 % — ABNORMAL LOW (ref 39.0–52.0)
Hemoglobin: 9.2 g/dL — ABNORMAL LOW (ref 13.0–17.0)
MCH: 27.9 pg (ref 26.0–34.0)
MCHC: 31 g/dL (ref 30.0–36.0)
MCV: 90 fL (ref 80.0–100.0)
Platelets: 158 10*3/uL (ref 150–400)
RBC: 3.3 MIL/uL — ABNORMAL LOW (ref 4.22–5.81)
RDW: 15.2 % (ref 11.5–15.5)
WBC: 13.6 10*3/uL — ABNORMAL HIGH (ref 4.0–10.5)
nRBC: 0 % (ref 0.0–0.2)

## 2023-09-14 LAB — BASIC METABOLIC PANEL
Anion gap: 10 (ref 5–15)
BUN: 60 mg/dL — ABNORMAL HIGH (ref 8–23)
CO2: 31 mmol/L (ref 22–32)
Calcium: 8.5 mg/dL — ABNORMAL LOW (ref 8.9–10.3)
Chloride: 98 mmol/L (ref 98–111)
Creatinine, Ser: 1.53 mg/dL — ABNORMAL HIGH (ref 0.61–1.24)
GFR, Estimated: 46 mL/min — ABNORMAL LOW (ref >60)
Glucose, Bld: 101 mg/dL — ABNORMAL HIGH (ref 70–99)
Potassium: 3.6 mmol/L (ref 3.5–5.1)
Sodium: 139 mmol/L (ref 135–145)

## 2023-09-14 LAB — APTT: aPTT: 106 s — ABNORMAL HIGH (ref 24–36)

## 2023-09-14 LAB — MAGNESIUM: Magnesium: 2.2 mg/dL (ref 1.7–2.4)

## 2023-09-14 LAB — HEPARIN LEVEL (UNFRACTIONATED): Heparin Unfractionated: 0.79 [IU]/mL — ABNORMAL HIGH (ref 0.30–0.70)

## 2023-09-14 MED ORDER — ISOSORBIDE MONONITRATE ER 30 MG PO TB24
30.0000 mg | ORAL_TABLET | Freq: Every day | ORAL | Status: DC
  Administered 2023-09-14 – 2023-09-16 (×3): 30 mg via ORAL
  Filled 2023-09-14 (×3): qty 1

## 2023-09-14 MED ORDER — RIVAROXABAN 20 MG PO TABS: 20.0000 mg | ORAL_TABLET | Freq: Every day | ORAL | Status: DC

## 2023-09-14 MED ORDER — HEPARIN SODIUM (PORCINE) 5000 UNIT/ML IJ SOLN
5000.0000 [IU] | Freq: Three times a day (TID) | INTRAMUSCULAR | Status: DC
Start: 2023-09-15 — End: 2023-09-14

## 2023-09-14 MED ORDER — PHENOL 1.4 % MT LIQD
1.0000 | OROMUCOSAL | Status: DC | PRN
  Administered 2023-09-14: 1 via OROMUCOSAL
  Filled 2023-09-14: qty 177

## 2023-09-14 MED ORDER — FUROSEMIDE 10 MG/ML IJ SOLN
40.0000 mg | Freq: Once | INTRAMUSCULAR | Status: AC
  Administered 2023-09-14: 40 mg via INTRAVENOUS
  Filled 2023-09-14: qty 4
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
## 2023-09-14 NOTE — Progress Notes (Signed)
 PROGRESS NOTE    Richard Rivers  FMW:[id number] DOB: 05/02/1946 DOA: 09/12/2023 PCP: System, Provider Not In    Brief Narrative:  78 year old gentleman with history of hypertension, hyperlipidemia, coronary artery disease, history of depression, anxiety and smoker, COPD presented with about 4 days of shortness of breath, chest pain and diarrhea.  In the emergency room 99% on 2 L oxygen.  Respiratory virus panel positive for influenza A.  BNP 2799.  Troponin is 2877.  Chest x-ray with chronic findings.  EKG with nonischemic EKG changes.  Cardiology was consulted, started on heparin  drip and admitted to the hospital.  Subjective:  Patient seen and examined.  His son was at the bedside.  Patient tells me that he is feeling chest tightness and would like some Lasix  that was prescribed by cardiology.  Afebrile.  Remains on 2 L oxygen.   Assessment & Plan:   Non-ST elevation MI: Currently stable.  Intermittent chest pain.  Patient declined to have cardiac cath and also recommended medical management. Limited echocardiogram similar to one from 09/03/2023.  EF 35 to 40%. Treating medically, patient currently on Plavix , heparin  infusion, nitroglycerin  infusion.  Already on high-dose statin. Cardiology following.  Planning to taper off nitroglycerin  infusion, added Imdur .  Heparin  drip to stop today.  Acute on chronic combined heart failure: Presented with shortness of breath with minimal activity.  Known EF of 35 to 40%.  Intermittently diuresing.  COPD with exacerbation secondary to influenza A infection: Tamiflu  for 5 days Solu-Medrol, followed by prednisone  for 1 week. scheduled and as needed bronchodilators, deep breathing exercises, incentive spirometry, chest physiotherapy and respiratory therapy consult. Supplemental oxygen to keep saturations more than 92%.  Mobilize with PT OT.  AKI: Likely prerenal.  Baseline creatinine 1-1.1.  Presentation creatinine with 1.7.  Stabilizing.   Creatinine 1.53 today.  Essential hypertension: Blood pressure is stable on Coreg .  Imdur  added today.  Lisinopril  on hold due to AKI.  Ultimate plan to add Entresto .  Peripheral artery disease: Status post right femoral peroneal bypass in 2023.  Patient on Xarelto  at home.  Holding Xarelto  on heparin .  Depression and anxiety: On trazodone  and BuSpar .  Smoker: Counseled to quit.  Nicotine  patch.   Continue to mobilize with PT OT.  DVT prophylaxis: Heparin  infusion   Code Status: Full code Family Communication: Patient's son at the bedside. Disposition Plan: Status is: Inpatient Remains inpatient appropriate because: Actively treated for chest pain.  On vasoactive infusions     Consultants:  Cardiology  Procedures:  None  Antimicrobials:  Tamiflu  1/12----      Objective: Vitals:   09/13/23 1220 09/13/23 2018 09/14/23 0516 09/14/23 1132  BP: 136/74 (!) 127/56 125/65 (!) 106/46  Pulse: 80 68 65 64  Resp: 16 16 16 16   Temp: 97.6 F (36.4 C) 97.8 F (36.6 C) 97.7 F (36.5 C) 97.9 F (36.6 C)  TempSrc: Oral Oral Oral Oral  SpO2: 98% 99% 99% 96%  Weight:   65.4 kg   Height:        Intake/Output Summary (Last 24 hours) at 09/14/2023 1144 Last data filed at 09/14/2023 1141 Gross per 24 hour  Intake 324.53 ml  Output 800 ml  Net -475.47 ml   Filed Weights   09/12/23 1259 09/13/23 0359 09/14/23 0516  Weight: 65.9 kg 65.1 kg 65.4 kg    Examination:  General exam: Appears calm.  Slightly anxious today.  On 2 L oxygen. Respiratory system: Clear to auscultation.  Poor inspiratory efforts.  No  added sounds. SpO2: 96 % O2 Flow Rate (L/min): 2 L/min  Cardiovascular system: S1 & S2 heard, RRR. No JVD, murmurs, rubs, gallops or clicks.  Gastrointestinal system: Soft.  Nontender.  Bowel sound present. Central nervous system: Alert and oriented. No focal neurological deficits.   Data Reviewed: I have personally reviewed following labs and imaging  studies  CBC: Recent Labs  Lab 09/12/23 1323 09/12/23 1539 09/13/23 0332 09/14/23 0424  WBC 10.1  --  10.1 13.6*  NEUTROABS 8.5*  --   --   --   HGB 10.6* 10.9* 10.4* 9.2*  HCT 32.8* 32.0* 33.6* 29.7*  MCV 88.9  --  90.8 90.0  PLT 208  --  165 158   Basic Metabolic Panel: Recent Labs  Lab 09/12/23 1533 09/12/23 1539 09/13/23 0332 09/14/23 0417 09/14/23 0424  NA 137 138 137 139  --   K 3.8 3.7 3.8 3.6  --   CL 99 102 100 98  --   CO2 25  --  27 31  --   GLUCOSE 125* 118* 135* 101*  --   BUN 48* 44* 53* 60*  --   CREATININE 1.71* 1.60* 1.50* 1.53*  --   CALCIUM  8.4*  --  8.3* 8.5*  --   MG  --   --  2.2  --  2.2   GFR: Estimated Creatinine Clearance: 36.8 mL/min (A) (by C-G formula based on SCr of 1.53 mg/dL (H)). Liver Function Tests: Recent Labs  Lab 09/12/23 1533  AST 91*  ALT 61*  ALKPHOS 84  BILITOT 0.7  PROT 5.9*  ALBUMIN 2.6*   No results for input(s): LIPASE, AMYLASE in the last 168 hours. No results for input(s): AMMONIA in the last 168 hours. Coagulation Profile: Recent Labs  Lab 09/13/23 0332  INR 1.2   Cardiac Enzymes: No results for input(s): CKTOTAL, CKMB, CKMBINDEX, TROPONINI in the last 168 hours. BNP (last 3 results) No results for input(s): PROBNP in the last 8760 hours. HbA1C: No results for input(s): HGBA1C in the last 72 hours. CBG: Recent Labs  Lab 09/13/23 1311 09/13/23 1732 09/13/23 2129 09/14/23 0742 09/14/23 1130  GLUCAP 103* 144* 119* 91 126*   Lipid Profile: Recent Labs    09/13/23 0332  CHOL 123  HDL 53  LDLCALC 48  TRIG 112  CHOLHDL 2.3   Thyroid Function Tests: No results for input(s): TSH, T4TOTAL, FREET4, T3FREE, THYROIDAB in the last 72 hours. Anemia Panel: No results for input(s): VITAMINB12, FOLATE, FERRITIN, TIBC, IRON , RETICCTPCT in the last 72 hours. Sepsis Labs: No results for input(s): PROCALCITON, LATICACIDVEN in the last 168 hours.  Recent  Results (from the past 240 hours)  Resp panel by RT-PCR (RSV, Flu A&B, Covid) Anterior Nasal Swab     Status: Abnormal   Collection Time: 09/12/23 12:59 PM   Specimen: Anterior Nasal Swab  Result Value Ref Range Status   SARS Coronavirus 2 by RT PCR NEGATIVE NEGATIVE Final   Influenza A by PCR POSITIVE (A) NEGATIVE Final   Influenza B by PCR NEGATIVE NEGATIVE Final    Comment: (NOTE) The Xpert Xpress SARS-CoV-2/FLU/RSV plus assay is intended as an aid in the diagnosis of influenza from Nasopharyngeal swab specimens and should not be used as a sole basis for treatment. Nasal washings and aspirates are unacceptable for Xpert Xpress SARS-CoV-2/FLU/RSV testing.  Fact Sheet for Patients: bloggercourse.com  Fact Sheet for Healthcare Providers: seriousbroker.it  This test is not yet approved or cleared by the [place]  FDA and  has been authorized for detection and/or diagnosis of SARS-CoV-2 by FDA under an Emergency Use Authorization (EUA). This EUA will remain in effect (meaning this test can be used) for the duration of the COVID-19 declaration under Section 564(b)(1) of the Act, 21 U.S.C. section 360bbb-3(b)(1), unless the authorization is terminated or revoked.     Resp Syncytial Virus by PCR NEGATIVE NEGATIVE Final    Comment: (NOTE) Fact Sheet for Patients: bloggercourse.com  Fact Sheet for Healthcare Providers: seriousbroker.it  This test is not yet approved or cleared by the [place]  FDA and has been authorized for detection and/or diagnosis of SARS-CoV-2 by FDA under an Emergency Use Authorization (EUA). This EUA will remain in effect (meaning this test can be used) for the duration of the COVID-19 declaration under Section 564(b)(1) of the Act, 21 U.S.C. section 360bbb-3(b)(1), unless the authorization is terminated or revoked.  Performed at [facility], [address], [place], [place] [address]          Radiology Studies: ECHOCARDIOGRAM LIMITED Result Date: 09/13/2023    ECHOCARDIOGRAM LIMITED REPORT   Patient Name:   Richard Rivers Date of Exam: 09/13/2023 Medical Rec #:  [id number]        Height:       72.0 in Accession #:    [id number]       Weight:       143.6 lb Date of Birth:  Mar 24, 1946        BSA:          1.851 m Patient Age:    78 years         BP:           136/70 mmHg Patient Gender: M                HR:           88 bpm. Exam Location:  Inpatient Procedure: Limited Echo and Limited Color Doppler Indications:    Acute myocardial infarction, unspecified I21.9  History:        Patient has prior history of Echocardiogram examinations, most                 recent 09/03/2023. CHF, Previous Myocardial Infarction and CAD,                 COPD, PAD and Pulmonary HTN; Risk Factors:Dyslipidemia.  Sonographer:    Thea Norlander RCS Referring Phys: [id number] SAGAR H JINWALA IMPRESSIONS  1. Left ventricular ejection fraction, by estimation, is 35 to 40%. The left ventricle has moderately decreased function. The left ventricle demonstrates global hypokinesis. Left ventricular diastolic parameters are consistent with Grade II diastolic dysfunction (pseudonormalization).  2. Right ventricular systolic function is normal. The right ventricular size is normal. There is moderately elevated pulmonary artery systolic pressure. The estimated right ventricular systolic pressure is 45.8 mmHg.  3. The mitral valve is normal in structure. Mild to moderate mitral valve regurgitation.  4. Tricuspid valve regurgitation is moderate.  5. The aortic valve is tricuspid. Aortic valve regurgitation is not visualized.  6. The inferior vena cava is normal in size with greater than 50% respiratory variability, suggesting right atrial pressure of 3 mmHg. Comparison(s): No significant change from prior study. Conclusion(s)/Recommendation(s): Consider an ischemic evaluation. FINDINGS   Left Ventricle: Left ventricular ejection fraction, by estimation, is 35 to 40%. The left ventricle has moderately decreased function. The left ventricle demonstrates global hypokinesis. There is no left ventricular hypertrophy. Left ventricular diastolic parameters  are consistent with Grade II diastolic dysfunction (pseudonormalization). Right Ventricle: The right ventricular size is normal. Right ventricular systolic function is normal. There is moderately elevated pulmonary artery systolic pressure. The tricuspid regurgitant velocity is 3.27 m/s, and with an assumed right atrial pressure of 3 mmHg, the estimated right ventricular systolic pressure is 45.8 mmHg. Pericardium: There is no evidence of pericardial effusion. Mitral Valve: The mitral valve is normal in structure. Mild to moderate mitral valve regurgitation. Tricuspid Valve: Tricuspid valve regurgitation is moderate. Aortic Valve: The aortic valve is tricuspid. Aortic valve regurgitation is not visualized. Aortic valve peak gradient measures 3.8 mmHg. Venous: The inferior vena cava is normal in size with greater than 50% respiratory variability, suggesting right atrial pressure of 3 mmHg. LEFT VENTRICLE PLAX 2D LVIDd:         5.60 cm   Diastology LVIDs:         5.00 cm   LV e' medial:    4.58 cm/s LV PW:         1.00 cm   LV E/e' medial:  22.1 LV IVS:        0.90 cm   LV e' lateral:   9.83 cm/s LVOT diam:     2.00 cm   LV E/e' lateral: 10.3 LV SV:         49 LV SV Index:   26 LVOT Area:     3.14 cm  RIGHT VENTRICLE             IVC RV S prime:     14.40 cm/s  IVC diam: 1.20 cm TAPSE (M-mode): 1.6 cm LEFT ATRIUM         Index LA diam:    3.90 cm 2.11 cm/m  AORTIC VALVE AV Area (Vmax): 2.92 cm AV Vmax:        97.70 cm/s AV Peak Grad:   3.8 mmHg LVOT Vmax:      90.73 cm/s LVOT Vmean:     58.967 cm/s LVOT VTI:       0.155 m  AORTA Ao Root diam: 3.40 cm Ao Asc diam:  3.20 cm MITRAL VALVE                TRICUSPID VALVE MV Area (PHT): 5.97 cm     TR Peak  grad:   42.8 mmHg MV Decel Time: 127 msec     TR Vmax:        327.00 cm/s MV E velocity: 101.00 cm/s MV A velocity: 51.90 cm/s   SHUNTS MV E/A ratio:  1.95         Systemic VTI:  0.15 m                             Systemic Diam: 2.00 cm Ronal Ross Electronically signed by Ronal Ross Signature Date/Time: 09/13/2023/9:41:36 AM    Final    DG Chest Port 1 View Result Date: 09/12/2023 CLINICAL DATA:  Shortness of breath. EXAM: PORTABLE CHEST 1 VIEW COMPARISON:  09/01/2023 FINDINGS: Stable asymmetric elevation right hemidiaphragm. Near complete resolution of pleural effusions seen previously. Interstitial markings are diffusely coarsened with chronic features. Chronic atelectasis or scarring noted right base. No focal consolidation or pleural effusion. Cardiopericardial silhouette is at upper limits of normal for size. Bones are diffusely demineralized. IMPRESSION: Chronic interstitial coarsening without acute cardiopulmonary findings. Pleural effusions seen previously have essentially resolved. Electronically Signed   By: Camellia Candle M.D.   On:  09/12/2023 13:27        Scheduled Meds:  atorvastatin   80 mg Oral QHS   busPIRone   10 mg Oral BID   carvedilol   12.5 mg Oral BID WC   clopidogrel   75 mg Oral Daily   famotidine   20 mg Oral Daily   feeding supplement  237 mL Oral BID BM   fluticasone  furoate-vilanterol  1 puff Inhalation Daily   furosemide   40 mg Intravenous Once   insulin  aspart  0-9 Units Subcutaneous TID WC   isosorbide  mononitrate  30 mg Oral Daily   multivitamin with minerals  1 tablet Oral Daily   nicotine   21 mg Transdermal Daily   oseltamivir   30 mg Oral BID   predniSONE   40 mg Oral Q breakfast   senna-docusate  1 tablet Oral BID   traZODone   100 mg Oral QHS   umeclidinium bromide   1 puff Inhalation Daily   Continuous Infusions:  heparin  950 Units/hr (09/14/23 0723)     LOS: 2 days    Time spent: 40 minutes    Renato Applebaum, MD [facility]

## 2023-09-14 NOTE — Progress Notes (Signed)
 Heart Failure Stewardship Pharmacist Progress Note   PCP: System, Provider Not In PCP-Cardiologist: None    HPI:  78 yo M with PMH hypertension, hyperlipidemia, CAD s/p complex PCI in 2023, gout, depression, anxiety, tobacco use disorder, PAD s/p bypass in 2021 and 2023, HFrEF, COPD.   Presented to the ED on 1/12 with SOB, chest pain, and diarrhea. Was recently discharged from [facility] on 1/3 at which time he was found to have new HFrEF and was treated for CHF and COPD exacerbations. ECHO on 1/3 showed pulmonary HTN, EF 35-40%, global hypokinesis, LV mildly dilated, G2DD, RV function low normal, RV mildly enlarged, mildly increased RV wall thickness, severely elevated PASP, left atria mild to moderately dilated, right atria mild to moderately dilated, mild MR, mild to moderate TR. ECHO done this admission largely unchanged, but noted some improvements including RV function and size normal and moderately elevated PASP. In the ED, flu positive, BNP elevated to 2799, trop 2877->3859->3482, and AKI, found to have NSTEMI. Due to AKI and risk of contrast-induced nephropathy, plan for medical management. Started on nitroglycerin  for chest pain, now transitioned to Imdur . SOB with minimal activity.  Reports that he gets all his medications for free from the [facility] for $0 copays.  Still on 2L O2. Is not on any oxygen at home. Trace LE edema. He is asking if he'll need oxygen and breathing treatments at discharge. Discussed that we will repeat walk tests etc after he is transitioned to PO lasix  to determine need for home O2. Last BP before entering the room recorded as 92/58. He denied any lightheadedness or dizziness (only some lightheadedness yesterday when standing). BP improved to 108/55 when I was in the room.   Current HF Medications: Diuretic: furosemide  40 mg IV once on 1/12, 1/13, and 1/14  Beta Blocker: carvedilol  12.5 mg BID Other: Imdur  30 mg daily  Prior to admission HF Medications: Diuretic:  furosemide  20 mg daily Beta blocker: carvedilol  12.5 mg BID ACE/ARB/ARNI: lisinopril  40 mg daily  Pertinent Lab Values: Serum creatinine 1.53, BUN 60, Potassium 3.6, Sodium 139, BNP 2799, Magnesium  2.2  From 1/2: A1c 6.0  Vital Signs: Weight: 144 lbs (admission weight: 145 lbs) Blood pressure: 120/60s  Heart rate: 60-70s  I/O: net -0.8 L yesterday; net -1.4 L since admission  Medication Assistance / Insurance Benefits Check: Does the patient have prescription insurance?  Yes Type of insurance plan: [employer], [facility]/[employer]  Outpatient Pharmacy:  [facility] Is the patient willing to use [facility] TOC pharmacy at discharge? No Is the patient willing to transition their outpatient pharmacy to utilize a [facility] outpatient pharmacy?   No    Assessment: 1. Acute on chronic systolic HFrEF (LVEF 35-40%), due to ICM. NYHA class III symptoms. - Continue carvedilol  12.5 mg BID  - Continue assessing need for Lasix  daily. Agree with one time dose today given his SOB and O2 requirement. May be close to transitioning to PO lasix .  - Continue to hold lisinopril  for AKI. Once AKI resolves, consider starting Entresto  24/26 mg BID. - Consider addition of Jardiance  prior to discharge once kidney function is improving. VA only has Jardiance  25 mg tablets on formulary so if initiated would need to write discharge prescription for 1/2 tablet (12.5 mg) daily of 25 mg tablets. - Now off nitroglycerin  - transitioned to Imdur  30 mg daily   Plan: 1) Medication changes recommended at this time: - Continue IV lasix  today, may be able to switch  to PO tomorrow  2) Patient assistance: - Patient gets all medications covered from the [facility] and would like to get all his discharge prescriptions from there given that he normally has $0 copays   3)  Education  - Initial education completed, full education to be completed prior to discharge   Duwaine Plant, PharmD,  BCPS Heart Failure Engineer, Building Services Phone [phone]

## 2023-09-14 NOTE — Plan of Care (Signed)
°  Problem: Education: °Goal: Understanding of cardiac disease, CV risk reduction, and recovery process will improve °Outcome: Progressing °Goal: Individualized Educational Video(s) °Outcome: Progressing °  °

## 2023-09-14 NOTE — Progress Notes (Addendum)
 Patient Name: Richard Rivers Date of Encounter: 09/14/2023 [facility] Cardiologist: cardiology at the [facility]   Interval Summary  .    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  Patient states he felt a little better than yesterday, SOB at rest is improving, appetite is bit better today, reports mild swelling of legs. He confirmed that he wishes medical therapy at this time. He denied any chest pain. He felt a little dizzy when he changes position, states this has been going on for some time.   Vital Signs .    Vitals:   09/13/23 0734 09/13/23 1220 09/13/23 2018 09/14/23 0516  BP:  136/74 (!) 127/56 125/65  Pulse:  80 68 65  Resp:  16 16 16   Temp:  97.6 F (36.4 C) 97.8 F (36.6 C) 97.7 F (36.5 C)  TempSrc:  Oral Oral Oral  SpO2: 98% 98% 99% 99%  Weight:    65.4 kg  Height:        Intake/Output Summary (Last 24 hours) at 09/14/2023 0926 Last data filed at 09/14/2023 0519 Gross per 24 hour  Intake --  Output 800 ml  Net -800 ml      09/14/2023    5:16 AM 09/13/2023    3:59 AM 09/12/2023   12:59 PM  Last 3 Weights  Weight (lbs) 144 lb 1.6 oz 143 lb 9.6 oz 145 lb 4.5 oz  Weight (kg) 65.363 kg 65.137 kg 65.9 kg      Telemetry/ECG    Sinus rhythm, frequent PACs - Personally Reviewed  Physical Exam .   GEN: No acute distress.  Elderly frail  Neck: No JVD Cardiac: RRR, no murmurs, rubs, or gallops.  Respiratory: diminished at base, rhonchi bilaterally, on 2LNC oxygen  GI: Soft, nontender, non-distended  MS: trace BLE edema  Assessment & Plan .     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 He has known CTO of the RCA with L-R collaterals, mod nonobstructive CAD in the LAD and OM  - presented with chest pain, SOB  - Hs trop [phone] - EKG showed diffuse ST depression in the inf and ant-lat leads  - LDL 48 09/13/23, A1C 6% from 09/02/23  - Limited Echo 1/13 showed LVEF 35-40%, global HPK, grade II DD, normal RV, PASP 45.8 mmHg, mild to mod MR, mod TR, IVC normal (LVEF stable comparing to Echo 1/3) - LHC was felt not appropriate given AKI initially, he was given IV heparin  gtt and nitroglycerin  gtt, now chest pain had resolved, given AKI and chronic complex medical hx including COPD/PAD, patient wishes to continue medical therapy which is reasonable at this time, if chest pain is refractory to medical therapy, LHC should be re-considered  - Medical therapy: would complete 48 hours of heparin  gtt (stop at 4pm today), continue plavix  75mg , lipitor  80mg , coreg  12.5mg  BID; will wean off Nitroglycerin  gtt today and add Imdur  30mg  today   Acute on chronic systolic heart failure  - suspect contributing current symptoms  - BNP 2799 (was  2867 1/1/125) - CXR no acute finding, POA - Echo as above  - on lasix  20mg  daily PTA, s/p IV Lasix  40mg  x1 yesterday, UOP , BMP today with Cr 1.53 (stable comparing to yesterday),  clinically with mild volume up, recommend repeat IV Lasix  40mg  x1 today  - GDMT: on PTA coreg , PTA lisinopril  stopped due to AKI since admission, once AKI resolves, plan to start Entresto  24/26 mg BID and add SGLT2I and MRA gradually   Flu A AKI COPD exacerbation  Anemia  HTN HLD Gout Depression with anxiety Tobacco use Debility  - per IM   For questions or updates, please contact [facility] Please consult [email or web address] for contact info under        Signed, Xika Zhao, NP   History and all data above reviewed.  Patient examined.  I agree with the findings as above.  Breathing better and in no chest pain.   Able to ambulate yesterday with O2.  The patient exam reveals COR:RRR  ,  Lungs: Decreased breath sounds without crackles  ,  Abd: Positive bowel sounds, no rebound no guarding, Ext No edema  .  All available labs, radiology testing, previous records reviewed. Agree with documented assessment and plan.   Unstable angina:  Agree with one more dose of diuretic today.    Agree with discontinue Heparin  and IV NTG later today.  Pursuing conservative therapy.  No plan for invasive procedure.  Lynwood Aayat Hajjar  1:01 PM  09/14/2023

## 2023-09-14 NOTE — TOC Initial Note (Signed)
 Transition of Care [facility]) - Initial/Assessment Note    Patient Details  Name: Richard Rivers MRN: [id number] Date of Birth: Jan 06, 1946  Transition [facility] CM/SW Contact:    Sudie Erminio Deems, RN Phone Number: 09/14/2023, 3:55 PM  Clinical Narrative: Patient presented for chest pain. PTA patient states he was from home with the support of son. Patient has DME rolling walker. Family to provide the patient with a shower chair. Case Manager discussed home health PT and he is agreeable. Patient has no preference for agency use; however, he wants to make sure the VA can assist with payment. Case Manager did call the [facility] [employer] and the patient is a member of the [facility]-PCP is Arlyne Marinas- patient is 90% service connected. Awaiting confirmation for VA authorization for home health. Case Manager has made the referral to [facility]. Case Manager will continue to follow for additional transition of care needs as the patient progresses.                      Expected Discharge Plan: Home w Home Health Services Barriers to Discharge: Continued Medical Work up   Patient Goals and CMS Choice Patient states their goals for this hospitalization and ongoing recovery are:: plan for home with home health   Choice offered to / list presented to : Patient, NA (Patient has no preference; however wants agency to be in network with the VA)      Expected Discharge Plan and Services   Discharge Planning Services: CM Consult (VA reached out to them for home health authorization.) Post Acute Care Choice: Home Health Living arrangements for the past 2 months: Single Family Home                           HH Arranged: PT HH Agency: [facility] Date [facility] Agency Contacted: 09/14/23 Time HH Agency Contacted: [phone] Representative spoke with at [facility] Agency: Darleene  Prior Living Arrangements/Services Living arrangements for the past 2 months: Single Family  Home Lives with:: Adult Children Patient language and need for interpreter reviewed:: Yes Do you feel safe going back to the place where you live?: Yes      Need for Family Participation in Patient Care: Yes (Comment) Care giver support system in place?: Yes (comment) Current home services: DME (rolling walker) Criminal Activity/Legal Involvement Pertinent to Current Situation/Hospitalization: No - Comment as needed  Activities of Daily Living   ADL Screening (condition at time of admission) Independently performs ADLs?: Yes (appropriate for developmental age) Does the patient have a NEW difficulty with bathing/dressing/toileting/self-feeding that is expected to last >3 days?: No Does the patient have a NEW difficulty with getting in/out of bed, walking, or climbing stairs that is expected to last >3 days?: No Does the patient have a NEW difficulty with communication that is expected to last >3 days?: No Is the patient deaf or have difficulty hearing?: Yes Does the patient have difficulty seeing, even when wearing glasses/contacts?: No Does the patient have difficulty concentrating, remembering, or making decisions?: No  Permission Sought/Granted Permission sought to share information with : Case Manager, Magazine Features Editor, Family Supports Permission granted to share information with : Yes, Verbal Permission Granted              Emotional Assessment Appearance:: Appears stated age Attitude/Demeanor/Rapport: Engaged Affect (typically observed): Appropriate Orientation: : Oriented to Self, Oriented to Place, Oriented to  Time, Oriented to Situation Alcohol  / Substance Use: Not Applicable Psych Involvement: No (comment)  Admission diagnosis:  Myocardial infarction (HCC) [I21.9] COPD exacerbation (HCC) [J44.1] NSTEMI (non-ST elevated myocardial infarction) (HCC) [I21.4] Patient Active Problem List   Diagnosis Date Noted   Malnutrition of moderate degree 09/14/2023    NSTEMI (non-ST elevated myocardial infarction) (HCC) 09/12/2023   HFrEF (heart failure with reduced ejection fraction) (HCC) 09/03/2023   Pulmonary hypertension (HCC) 09/03/2023   PAD (peripheral artery disease) (HCC) 09/02/2023   Neuropathy 09/02/2023   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, level unspecified, initial encounter (HCC) 09/02/2023   COPD with acute exacerbation (HCC) 09/02/2023   Depression with anxiety 09/01/2023   Pleural effusion 09/01/2023   Myocardial injury 09/01/2023   Hypertension    HLD (hyperlipidemia)    CAD (coronary artery disease)    Gout    Special screening for malignant neoplasms, colon    Benign neoplasm of appendix    Benign neoplasm of sigmoid colon    Benign neoplasm of ascending colon    Benign neoplasm of transverse colon    Fracture of neck of femur (HCC) 02/01/2015   PCP:  System, Provider Not In Pharmacy:   [id number] - [place], [place] - [address] AT [facility] [address] [facility] [address] [place] [address] Phone: [phone] Fax: [phone]     Social Drivers of Health (SDOH) Social History: SDOH Screenings   Food Insecurity: No Food Insecurity (09/12/2023)  Housing: Low Risk  (09/13/2023)  Transportation Needs: No Transportation Needs (09/13/2023)  Utilities: Not At Risk (09/12/2023)  Alcohol  Screen: Low Risk  (09/13/2023)  Financial Resource Strain: Low Risk  (09/13/2023)  Social Connections: Socially Isolated (09/12/2023)  Tobacco Use: High Risk (09/12/2023)   SDOH Interventions: Housing Interventions: Intervention Not Indicated Transportation Interventions: Intervention Not Indicated Alcohol  Usage Interventions: Intervention Not Indicated (Score <7) Financial Strain Interventions: Intervention Not Indicated   Readmission Risk Interventions     No data to display

## 2023-09-15 DIAGNOSIS — I214 Non-ST elevation (NSTEMI) myocardial infarction: Secondary | ICD-10-CM | POA: Diagnosis not present

## 2023-09-15 LAB — BASIC METABOLIC PANEL
Anion gap: 6 (ref 5–15)
BUN: 53 mg/dL — ABNORMAL HIGH (ref 8–23)
CO2: 33 mmol/L — ABNORMAL HIGH (ref 22–32)
Calcium: 9.1 mg/dL (ref 8.9–10.3)
Chloride: 101 mmol/L (ref 98–111)
Creatinine, Ser: 1.24 mg/dL (ref 0.61–1.24)
GFR, Estimated: 60 mL/min — ABNORMAL LOW (ref >60)
Glucose, Bld: 100 mg/dL — ABNORMAL HIGH (ref 70–99)
Potassium: 4 mmol/L (ref 3.5–5.1)
Sodium: 140 mmol/L (ref 135–145)

## 2023-09-15 LAB — GLUCOSE, CAPILLARY
Glucose-Capillary: 93 mg/dL (ref 70–99)
Glucose-Capillary: 116 mg/dL — ABNORMAL HIGH (ref 70–99)
Glucose-Capillary: 110 mg/dL — ABNORMAL HIGH (ref 70–99)
Glucose-Capillary: 324 mg/dL — ABNORMAL HIGH (ref 70–99)

## 2023-09-15 LAB — CBC
HCT: 30.6 % — ABNORMAL LOW (ref 39.0–52.0)
Hemoglobin: 9.4 g/dL — ABNORMAL LOW (ref 13.0–17.0)
MCH: 28.1 pg (ref 26.0–34.0)
MCHC: 30.7 g/dL (ref 30.0–36.0)
MCV: 91.3 fL (ref 80.0–100.0)
Platelets: 152 10*3/uL (ref 150–400)
RBC: 3.35 MIL/uL — ABNORMAL LOW (ref 4.22–5.81)
RDW: 15.3 % (ref 11.5–15.5)
WBC: 10.3 10*3/uL (ref 4.0–10.5)
nRBC: 0 % (ref 0.0–0.2)

## 2023-09-15 MED ORDER — ISOSORBIDE MONONITRATE ER 30 MG PO TB24: 30.0000 mg | ORAL_TABLET | Freq: Every day | ORAL | 2 refills | Status: DC

## 2023-09-15 MED ORDER — RIVAROXABAN 2.5 MG PO TABS
2.5000 mg | ORAL_TABLET | Freq: Two times a day (BID) | ORAL | Status: DC
  Administered 2023-09-15 – 2023-09-16 (×3): 2.5 mg via ORAL
  Filled 2023-09-15 (×4): qty 1

## 2023-09-15 MED ORDER — ENTRESTO 24-26 MG PO TABS: 1.0000 | ORAL_TABLET | Freq: Two times a day (BID) | ORAL | 2 refills | Status: DC

## 2023-09-15 MED ORDER — RIVAROXABAN 2.5 MG PO TABS: 2.5000 mg | ORAL_TABLET | Freq: Two times a day (BID) | ORAL | 2 refills | Status: DC

## 2023-09-15 MED ORDER — SACUBITRIL-VALSARTAN 24-26 MG PO TABS
1.0000 | ORAL_TABLET | Freq: Two times a day (BID) | ORAL | Status: DC
  Administered 2023-09-15 – 2023-09-16 (×3): 1 via ORAL
  Filled 2023-09-15 (×3): qty 1

## 2023-09-15 MED ORDER — FUROSEMIDE 40 MG PO TABS
40.0000 mg | ORAL_TABLET | Freq: Every day | ORAL | Status: DC
  Administered 2023-09-15 – 2023-09-16 (×2): 40 mg via ORAL
  Filled 2023-09-15 (×2): qty 1

## 2023-09-15 MED ORDER — OSELTAMIVIR PHOSPHATE 30 MG PO CAPS: 30.0000 mg | ORAL_CAPSULE | Freq: Two times a day (BID) | ORAL | 0 refills | Status: DC

## 2023-09-15 MED ORDER — NITROGLYCERIN 0.4 MG SL SUBL: 0.4000 mg | SUBLINGUAL_TABLET | SUBLINGUAL | 12 refills | Status: DC | PRN

## 2023-09-15 MED ORDER — CLOPIDOGREL BISULFATE 75 MG PO TABS: 75.0000 mg | ORAL_TABLET | Freq: Every day | ORAL | 2 refills | Status: DC

## 2023-09-15 NOTE — Progress Notes (Signed)
 BMP reviewed with Cr 1.24 and GFR 60 today. Cr baseline 1-1.1  Discharge meds recommendation for CAD and Chronic systolic heart failure:  Plavix  75mg  daily Xarleto 2.5mg  daily (Dr Lavonne [place] recommend reduce from 20mg  for PAD, if further change needed, defer to [facility] vascular surgery) Coreg  12.5 mg BID Imdur  30mg   Lipitor  80mg   Entresto  24/26mg  BID   Please arrange follow up with his cardiologist at [facility] in 2 week, will need BMP in 1 week for renal function monitor.

## 2023-09-15 NOTE — Progress Notes (Signed)
 Physical Therapy Treatment Patient Details Name: Richard Rivers MRN: [id number] DOB: [birth year] Today's Date: 09/15/2023   History of Present Illness 78 y/o male presented to ED on 09/01/23 for worsening SOB. Admitted for acute CHF exacerbation. PMH: CAD with stent placement, HTN, HLD, gout, depression, anxiety, tobacco abuse    PT Comments  Focused session on lower extremity strength and balance training in order to reduce pt's risk for falls. Deferred attempting to progress mobility distance due to pt reporting he just finished walking the halls with other staff. The pt specifically demonstrates deficits in bil quadriceps strength and needed cues for eccentric control when transferring from stand to sit. In addition, he has difficulty maintaining static standing balance with narrow stances. Will continue to follow acutely.    If plan is discharge home, recommend the following: A little help with walking and/or transfers;A little help with bathing/dressing/bathroom;Assistance with cooking/housework;Help with stairs or ramp for entrance;Assist for transportation   Can travel by private vehicle        Equipment Recommendations  Rolling walker (2 wheels)    Recommendations for Other Services       Precautions / Restrictions Precautions Precautions: Fall Precaution Comments: watch O2 Restrictions Weight Bearing Restrictions Per Provider Order: No     Mobility  Bed Mobility               General bed mobility comments: Pt sitting EOB upon arrival and at end of session.    Transfers Overall transfer level: Needs assistance Equipment used: Rolling walker (2 wheels) Transfers: Sit to/from Stand Sit to Stand: Contact guard assist           General transfer comment: Cues provided for pt to scoot closer to EOB to improve ease with transfers. Pt reliant on using his bil UEs to facilitate power up to stand each rep. x10 reps from EOB, no LOB, CGA for safety     Ambulation/Gait Ambulation/Gait assistance: Contact guard assist Gait Distance (Feet): 5 Feet Assistive device: Rolling walker (2 wheels) Gait Pattern/deviations: Step-through pattern, Decreased stride length, Trunk flexed Gait velocity: decr Gait velocity interpretation: <1.31 ft/sec, indicative of household ambulator   General Gait Details: Pt ambulated a few feet within the room to perform exercises. Deferred further mobility as pt reports he just ambulated down the halls prior to PT arrival. Focused session rather on balance and strengthening exercises.   Stairs             Wheelchair Mobility     Tilt Bed    Modified Rankin (Stroke Patients Only)       Balance Overall balance assessment: Needs assistance Sitting-balance support: Feet supported, No upper extremity supported Sitting balance-[name]: Good     Standing balance support: Bilateral upper extremity supported, No upper extremity supported, During functional activity Standing balance-[name]: Fair Standing balance comment: Able to stand without UE support but has difficulty maintaining his balance in narrow stances, needing intermittent minA to regain LOB. Reliant on RW for ambulation               High Level Balance Comments: Able to maintain Rhomberg stance with eyes open while reaching mod off COG and across midline with mild sway but no LOB, CGA for safety. Displayed LOB and needed minA to recover when attempting static standing in semi-tandem stance with L foot in lead, maintained ~60 seconds total.            Cognition Arousal: Alert Behavior During Therapy: [facility] for tasks  assessed/performed Overall Cognitive Status: Within Functional Limits for tasks assessed                                          Exercises General Exercises - Lower Extremity Hip ABduction/ADduction: AROM, Strengthening, Both, 10 reps, Standing (with RW support) Toe Raises: AROM, Right, 15  reps, Standing (with RW support) Other Exercises Other Exercises: Able to maintain Rhomberg stance with eyes open while reaching mod off COG and across midline with mild sway but no LOB, CGA for safety, ~60 seconds. Other Exercises: Displayed LOB and needed minA to recover when attempting static standing in semi-tandem stance with L foot in lead, maintained ~60 seconds total. Other Exercises: standing hamstring curls AROM 10x bil with RW support Other Exercises: sit <> stand 10x using bil UEs, unable to successfully stand with 1 or no UE utilization    General Comments        Pertinent Vitals/Pain Pain Assessment Pain Assessment: Faces Faces Pain Scale: Hurts little more Pain Location: chronic low back Pain Descriptors / Indicators: Discomfort, Grimacing, Sore Pain Intervention(s): Monitored during session, Limited activity within patient's tolerance, Utilized relaxation techniques, Other (comment) (soft tissue mobilization to pt tolerance provided at end of session)    Home Living                          Prior Function            PT Goals (current goals can now be found in the care plan section) Acute Rehab PT Goals Patient Stated Goal: to get better PT Goal Formulation: With patient Time For Goal Achievement: 09/27/23 Potential to Achieve Goals: Good Progress towards PT goals: Progressing toward goals    Frequency    Min 1X/week      PT Plan      Co-evaluation              AM-PAC PT "6 Clicks" Mobility   Outcome Measure  Help needed turning from your back to your side while in a flat bed without using bedrails?: A Little Help needed moving from lying on your back to sitting on the side of a flat bed without using bedrails?: A Little Help needed moving to and from a bed to a chair (including a wheelchair)?: A Little Help needed standing up from a chair using your arms (e.g., wheelchair or bedside chair)?: A Little Help needed to walk in hospital  room?: A Little Help needed climbing 3-5 steps with a railing? : A Little 6 Click Score: 18    End of Session Equipment Utilized During Treatment: Gait belt;Oxygen Activity Tolerance: Patient tolerated treatment well Patient left: in bed;with bed alarm set;with call bell/phone within reach   PT Visit Diagnosis: Unsteadiness on feet (R26.81);Muscle weakness (generalized) (M62.81);Other abnormalities of gait and mobility (R26.89)     Time: 6045-4098 PT Time Calculation (min) (ACUTE ONLY): 21 min  Charges:    $Therapeutic Exercise: 8-22 mins PT General Charges $$ ACUTE PT VISIT: 1 Visit                     Vernida Goodie, PT, DPT Acute Rehabilitation Services  Office: [phone]    Ellyn Hack 09/15/2023, 11:54 AM

## 2023-09-15 NOTE — Progress Notes (Addendum)
 Heart Failure Stewardship Pharmacist Progress Note   PCP: System, Provider Not In PCP-Cardiologist: None    HPI:  78 yo M with PMH hypertension, hyperlipidemia, CAD s/p complex PCI in 2023, gout, depression, anxiety, tobacco use disorder, PAD s/p bypass in 2021 and 2023, HFrEF, COPD.   Presented to the ED on 1/12 with SOB, chest pain, and diarrhea. Was recently discharged from [facility] on 1/3 at which time he was found to have new HFrEF and was treated for CHF and COPD exacerbations. ECHO on 1/3 showed pulmonary HTN, EF 35-40%, global hypokinesis, LV mildly dilated, G2DD, RV function low normal, RV mildly enlarged, mildly increased RV wall thickness, severely elevated PASP, left atria mild to moderately dilated, right atria mild to moderately dilated, mild MR, mild to moderate TR. ECHO done this admission largely unchanged, but noted some improvements including RV function and size normal and moderately elevated PASP. In the ED, flu positive, BNP elevated to 2799, trop 2877->3859->3482, and AKI, found to have NSTEMI. Due to AKI and risk of contrast-induced nephropathy, plan for medical management. Started on nitroglycerin  for chest pain, now transitioned to Imdur . SOB with minimal activity.  Reports that he gets all his medications for free from the [place] for $0 copays.  Denies shortness of breath. No edema on exam. Had further questions about home oxygen. All questions answered.   Current HF Medications: Diuretic: furosemide  40 mg IV once on 1/12, 1/13, and 1/14  Beta Blocker: carvedilol  12.5 mg BID Other: Imdur  30 mg daily  Prior to admission HF Medications: Diuretic: furosemide  20 mg daily Beta blocker: carvedilol  12.5 mg BID ACE/ARB/ARNI: lisinopril  40 mg daily  Pertinent Lab Values: BMET pending 1/15 As of 1/14: Serum creatinine 1.53, BUN 60, Potassium 3.6, Sodium 139, BNP 2799, Magnesium  2.2  From 1/2: A1c 6.0  Vital Signs: Weight: 144 lbs (admission weight: 145 lbs) Blood  pressure: 130/60s  Heart rate: 60-70s  I/O: net -0.8 L yesterday; net -2.4 L since admission  Medication Assistance / Insurance Benefits Check: Does the patient have prescription insurance?  Yes Type of insurance plan: [employer], [facility]  Outpatient Pharmacy:  [facility] the [facility] willing to use [facility] TOC pharmacy at discharge? No Is the patient willing to transition their outpatient pharmacy to utilize a [facility] outpatient pharmacy?   No    Assessment: 1. Acute on chronic systolic HFrEF (LVEF 35-40%), due to ICM. NYHA class II symptoms. - Continue carvedilol  12.5 mg BID  - Now off IV lasix . BMET pending. May transition to PO lasix  if stable/improved. - Continue to hold lisinopril  for AKI. Once AKI resolves, consider starting Entresto  24/26 mg BID. - Consider addition of Jardiance  prior to discharge once kidney function is improving. [facility] only has Jardiance  25 mg tablets on formulary so if initiated would need to write discharge prescription for 1/2 tablet (12.5 mg) daily of 25 mg tablets. - Now off nitroglycerin  - transitioned to Imdur  30 mg daily   Plan: 1) Medication changes recommended at this time: - May be able to transition to PO lasix  and/or start Entresto  pending BMET  2) Patient assistance: - Patient gets all medications covered from the [place] and would like to get all his discharge prescriptions from there given that he normally has $0 copays   3)  Education  - Patient has been educated on current HF medications and potential additions to HF medication regimen - Patient verbalizes understanding that over the next few months, these medication  doses may change and more medications may be added to optimize HF regimen - Patient has been educated on basic disease state pathophysiology and goals of therapy    Jerilyn Monte, PharmD, BCPS Heart Failure Stewardship Pharmacist Phone [phone]

## 2023-09-15 NOTE — Progress Notes (Signed)
 PROGRESS NOTE    Richard Rivers  WJX:[id number] DOB: 1946-08-06 DOA: 09/12/2023 PCP: System, Provider Not In    Brief Narrative:  78 year old gentleman with history of hypertension, hyperlipidemia, coronary artery disease, history of depression, anxiety and smoker, COPD presented with about 4 days of shortness of breath, chest pain and diarrhea.  In the emergency room 99% on 2 L oxygen.  Respiratory virus panel positive for influenza A.  BNP 2799.  Troponin is 2877.  Chest x-ray with chronic findings.  EKG with nonischemic EKG changes.  Cardiology was consulted, started on heparin  drip and admitted to the hospital.  Subjective:  Patient seen and examined.  Afebrile.  He thinks his breathing is better.  He will need oxygen to go home.  Will also need home health.  He thinks he is doing fairly enough to go home.   Assessment & Plan:   Non-ST elevation MI: Currently stable.  Intermittent chest pain.  Patient declined to have cardiac cath and also recommended medical management. Limited echocardiogram similar to one from 09/03/2023.  EF 35 to 40%. Treating medically, treated with Plavix , heparin  and nitroglycerin  infusion.  Discontinued now.  Medically stabilizing.   Now on Plavix  and Xarelto .  Tolerating Imdur .  Already on carvedilol .    Acute on chronic combined heart failure: Presented with shortness of breath with minimal activity.  Known EF of 35 to 40%.  Intermittently diuresing.  Euvolemic today.  COPD with exacerbation secondary to influenza A infection: Tamiflu  for 5 days Solu-Medrol, followed by prednisone  for 1 week. scheduled and as needed bronchodilators, deep breathing exercises, incentive spirometry, chest physiotherapy and respiratory therapy consult. Supplemental oxygen to keep saturations more than 90%.  Mobilize with PT OT.  Will recommend home health.  AKI: Likely prerenal.  Baseline creatinine 1-1.1.  Presentation creatinine with 1.7.  Stabilizing.  Creatinine pending  today.  Essential hypertension: Blood pressure is stable on Coreg .  Imdur  added today.  Lisinopril  on hold due to AKI.  Ultimate plan to add Entresto .  Peripheral artery disease: Status post right femoral peroneal bypass in 2023.  Patient on Xarelto  at home.  Xarelto  resumed.  Depression and anxiety: On trazodone  and BuSpar .  Smoker: Counseled to quit.  Nicotine  patch.   Continue to mobilize with PT OT.  Prescribed home oxygen through [place].  Anticipate home if oxygen can be arranged and cleared by cardiology.  DVT prophylaxis:  rivaroxaban  (XARELTO ) tablet 20 mg   Code Status: Full code Family Communication: None today. Disposition Plan: Status is: Inpatient Remains inpatient appropriate because: Actively treated for chest pain.  On vasoactive infusions     Consultants:  Cardiology  Procedures:  None  Antimicrobials:  Tamiflu  1/12----      Objective: Vitals:   09/14/23 1132 09/14/23 2046 09/15/23 0528 09/15/23 0722  BP: (!) 106/46 (!) 121/49 131/60   Pulse: 64 69 67   Resp: 16 18 17 18   Temp: 97.9 F (36.6 C) 98.3 F (36.8 C) 98 F (36.7 C)   TempSrc: Oral Axillary Axillary   SpO2: 96% 96% 100%   Weight:   65.7 kg   Height:        Intake/Output Summary (Last 24 hours) at 09/15/2023 1152 Last data filed at 09/15/2023 0904 Gross per 24 hour  Intake --  Output 1150 ml  Net -1150 ml   Filed Weights   09/13/23 0359 09/14/23 0516 09/15/23 0528  Weight: 65.1 kg 65.4 kg 65.7 kg    Examination:  General exam: Calm and comfortable.  Chronically sick looking but not in any distress. Respiratory system: Clear to auscultation.  Poor inspiratory efforts.  No added sounds. SpO2: 100 % O2 Flow Rate (L/min): 2 L/min  Cardiovascular system: S1 & S2 heard, RRR. No JVD, murmurs, rubs, gallops or clicks.  Gastrointestinal system: Soft.  Nontender.  Bowel sound present. Central nervous system: Alert and oriented. No focal neurological deficits. Chronic deformities of the  feet and pigmentation changes of the legs.  Data Reviewed: I have personally reviewed following labs and imaging studies  CBC: Recent Labs  Lab 09/12/23 1323 09/12/23 1539 09/13/23 0332 09/14/23 0424 09/15/23 0524  WBC 10.1  --  10.1 13.6* 10.3  NEUTROABS 8.5*  --   --   --   --   HGB 10.6* 10.9* 10.4* 9.2* 9.4*  HCT 32.8* 32.0* 33.6* 29.7* 30.6*  MCV 88.9  --  90.8 90.0 91.3  PLT 208  --  165 158 152   Basic Metabolic Panel: Recent Labs  Lab 09/12/23 1533 09/12/23 1539 09/13/23 0332 09/14/23 0417 09/14/23 0424  NA 137 138 137 139  --   K 3.8 3.7 3.8 3.6  --   CL 99 102 100 98  --   CO2 25  --  27 31  --   GLUCOSE 125* 118* 135* 101*  --   BUN 48* 44* 53* 60*  --   CREATININE 1.71* 1.60* 1.50* 1.53*  --   CALCIUM  8.4*  --  8.3* 8.5*  --   MG  --   --  2.2  --  2.2   GFR: Estimated Creatinine Clearance: 37 mL/min (A) (by C-G formula based on SCr of 1.53 mg/dL (H)). Liver Function Tests: Recent Labs  Lab 09/12/23 1533  AST 91*  ALT 61*  ALKPHOS 84  BILITOT 0.7  PROT 5.9*  ALBUMIN 2.6*   No results for input(s): "LIPASE", "AMYLASE" in the last 168 hours. No results for input(s): "AMMONIA" in the last 168 hours. Coagulation Profile: Recent Labs  Lab 09/13/23 0332  INR 1.2   Cardiac Enzymes: No results for input(s): "CKTOTAL", "CKMB", "CKMBINDEX", "TROPONINI" in the last 168 hours. BNP (last 3 results) No results for input(s): "PROBNP" in the last 8760 hours. HbA1C: No results for input(s): "HGBA1C" in the last 72 hours. CBG: Recent Labs  Lab 09/14/23 0742 09/14/23 1130 09/14/23 1610 09/14/23 2247 09/15/23 0724  GLUCAP 91 126* 167* 104* 93   Lipid Profile: Recent Labs    09/13/23 0332  CHOL 123  HDL 53  LDLCALC 48  TRIG 112  CHOLHDL 2.3   Thyroid Function Tests: No results for input(s): "TSH", "T4TOTAL", "FREET4", "T3FREE", "THYROIDAB" in the last 72 hours. Anemia Panel: No results for input(s): "VITAMINB12", "FOLATE", "FERRITIN",  "TIBC", "IRON", "RETICCTPCT" in the last 72 hours. Sepsis Labs: No results for input(s): "PROCALCITON", "LATICACIDVEN" in the last 168 hours.  Recent Results (from the past 240 hours)  Resp panel by RT-PCR (RSV, Flu A&B, Covid) Anterior Nasal Swab     Status: Abnormal   Collection Time: 09/12/23 12:59 PM   Specimen: Anterior Nasal Swab  Result Value Ref Range Status   SARS Coronavirus 2 by RT PCR NEGATIVE NEGATIVE Final   Influenza A by PCR POSITIVE (A) NEGATIVE Final   Influenza B by PCR NEGATIVE NEGATIVE Final    Comment: (NOTE) The Xpert Xpress SARS-CoV-2/FLU/RSV plus assay is intended as an aid in the diagnosis of influenza from Nasopharyngeal swab specimens and should not be used as a sole basis for treatment.  Nasal washings and aspirates are unacceptable for Xpert Xpress SARS-CoV-2/FLU/RSV testing.  Fact Sheet for Patients: BloggerCourse.com  Fact Sheet for Healthcare Providers: SeriousBroker.it  This test is not yet approved or cleared by the [place]  FDA and has been authorized for detection and/or diagnosis of SARS-CoV-2 by FDA under an Emergency Use Authorization (EUA). This EUA will remain in effect (meaning this test can be used) for the duration of the COVID-19 declaration under Section 564(b)(1) of the Act, 21 U.S.C. section 360bbb-3(b)(1), unless the authorization is terminated or revoked.     Resp Syncytial Virus by PCR NEGATIVE NEGATIVE Final    Comment: (NOTE) Fact Sheet for Patients: BloggerCourse.com  Fact Sheet for Healthcare Providers: SeriousBroker.it  This test is not yet approved or cleared by the [place]  FDA and has been authorized for detection and/or diagnosis of SARS-CoV-2 by FDA under an Emergency Use Authorization (EUA). This EUA will remain in effect (meaning this test can be used) for the duration of the COVID-19 declaration under  Section 564(b)(1) of the Act, 21 U.S.C. section 360bbb-3(b)(1), unless the authorization is terminated or revoked.  Performed at [facility], [address], [place], [place] [address]          Radiology Studies: No results found.       Scheduled Meds:  atorvastatin   80 mg Oral QHS   busPIRone   10 mg Oral BID   carvedilol   12.5 mg Oral BID WC   clopidogrel   75 mg Oral Daily   famotidine   20 mg Oral Daily   feeding supplement  237 mL Oral BID BM   fluticasone  furoate-vilanterol  1 puff Inhalation Daily   insulin  aspart  0-9 Units Subcutaneous TID WC   isosorbide  mononitrate  30 mg Oral Daily   multivitamin with minerals  1 tablet Oral Daily   nicotine   21 mg Transdermal Daily   oseltamivir   30 mg Oral BID   predniSONE   40 mg Oral Q breakfast   rivaroxaban   20 mg Oral Q supper   senna-docusate  1 tablet Oral BID   traZODone   100 mg Oral QHS   umeclidinium bromide   1 puff Inhalation Daily   Continuous Infusions:     LOS: 3 days    Time spent: 40 minutes    Vada Garibaldi, MD [facility]

## 2023-09-15 NOTE — Progress Notes (Addendum)
 SATURATION QUALIFICATIONS: (This note is used to comply with regulatory documentation for home oxygen)  Patient Saturations on Room Air at Rest = 94%  Patient Saturations on Room Air while Ambulating = 86%  Patient Saturations on 2 Liters of oxygen while Ambulating = 93%  Please briefly explain why patient needs home oxygen: desaturation and increased work of breathing on room air with ambulation  Agree with above documentation

## 2023-09-15 NOTE — Progress Notes (Signed)
 Mobility Specialist Progress Note;   09/15/23 1140  Mobility  Activity Ambulated with assistance in hallway  Level of Assistance Contact guard assist, steadying assist  Assistive Device Front wheel walker  Distance Ambulated (ft) 65 ft  Activity Response Tolerated well  Mobility Referral Yes  Mobility visit 1 Mobility  Mobility Specialist Start Time (ACUTE ONLY) 1140  Mobility Specialist Stop Time (ACUTE ONLY) 1155  Mobility Specialist Time Calculation (min) (ACUTE ONLY) 15 min    During-mobility: SPO2 93-96% 2LO2 Post-mobility: SPO2 95% 2LO2  Pt agreeable to mobility. On 2LO2 upon arrival. Required MinG assistance throughout ambulation for safety. Ambulated on 2LO2, VSS throughout. C/o pain in hips and legs at EOS but no c/o SOB. Pt left on EOB with all needs met, alarm on. On 2LO2.   Janit Meline Mobility Specialist Please contact via SecureChat or [employer] [phone]

## 2023-09-15 NOTE — Progress Notes (Addendum)
 Patient Name: Richard Rivers Date of Encounter: 09/15/2023 [facility] Cardiologist: cardiology at the [facility]   Interval Summary  .    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  Patient states he feels better, ate some food yesterday, had a bowel movement today, no further chest pain, tolerating current medication without issue. He has nasal cannula oxygen partly in his nose, pox 97%, he is not normally on oxygen at home.   Vital Signs .    Vitals:   09/14/23 1132 09/14/23 2046 09/15/23 0528 09/15/23 0722  BP: (!) 106/46 (!) 121/49 131/60   Pulse: 64 69 67   Resp: 16 18 17 18   Temp: 97.9 F (36.6 C) 98.3 F (36.8 C) 98 F (36.7 C)   TempSrc: Oral Axillary Axillary   SpO2: 96% 96% 100%   Weight:   65.7 kg   Height:        Intake/Output Summary (Last 24 hours) at 09/15/2023 0751 Last data filed at 09/15/2023 0530 Gross per 24 hour  Intake 324.53 ml  Output 1150 ml  Net -825.47 ml      09/15/2023    5:28 AM 09/14/2023    5:16 AM 09/13/2023    3:59 AM  Last 3 Weights  Weight (lbs) 144 lb 13.5 oz 144 lb 1.6 oz 143 lb 9.6 oz  Weight (kg) 65.7 kg 65.363 kg 65.137 kg      Telemetry/ECG    Sinus rhythm 70-80s, PACs and PVCs noted - Personally Reviewed  Physical Exam .   GEN: No acute distress.  Elderly frail  Neck: No JVD Cardiac: RRR, occasional skipped heart beat, no murmurs, rubs, or gallops.  Respiratory: clear bilaterally, on 2LNC oxygen, speaks full sentence  GI: Soft, nontender, non-distended  MS: no BLE edema  Assessment & Plan .     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 He has known CTO of the RCA with L-R collaterals, mod nonobstructive CAD in the LAD and OM  - presented with chest pain, SOB  - Hs trop [phone] - EKG showed diffuse ST depression in the inf and ant-lat leads  - LDL 48 09/13/23, A1C 6% from 09/02/23  - Limited Echo 1/13 showed LVEF 35-40%, global HPK, grade II DD, normal RV, PASP 45.8 mmHg, mild to mod MR, mod TR, IVC normal (LVEF stable comparing to Echo 1/3) - LHC was felt not appropriate given AKI initially, he was given IV heparin  gtt and nitroglycerin  gtt, now chest pain had resolved, given AKI and chronic complex medical hx including COPD/PAD, patient wishes to continue medical therapy which is reasonable at this time, if chest pain is refractory to medical therapy, LHC should be re-considered  - Medical therapy: completed 48 hours of heparin  gtt;  continue plavix  75mg , lipitor  80mg , coreg  12.5mg  BID; transitioned from Nitroglycerin  gtt to Imdur  30mg ; can continue this regimen at time of discharge (Note he was on Xarelrto 20mg  + ASA 81mg  for extensive PAD per [facility] vascular surgery, Xarelto  has been continued at current dosing  along with Plavix , shall bleeding occurs in the future, consider reduce Xarelto  dosing, discussed bleeding precaution)   Acute on chronic systolic heart failure  - suspect contributing current symptoms  - BNP 2799 (was 2867 1/1/125) - CXR no acute finding, POA - Echo as above  - on lasix  20mg  daily PTA, s/p IV Lasix  40mg  x 2 doses so far, Net -2.1 L, BMP today pending, clinically improving from volume standpoint, may transition to PO Lasix  today if renal index remains stable  - GDMT: on PTA coreg , PTA lisinopril  stopped due to AKI since admission, once AKI resolves, plan to start Entresto  24/26 mg BID and add SGLT2I and MRA gradually  - please wean off oxygen, ambulate today   Flu A AKI COPD exacerbation  Anemia  HTN HLD Gout Depression with anxiety Tobacco use Debility   - per IM    For questions or updates, please contact [facility] Please consult [email or web address] for contact info under     Signed, Xika Zhao, NP   History and all data above reviewed.  Patient examined.  I agree with the findings as above.   No chest pain.  Breathing is improved.   The patient exam reveals COR:RRR  ,  Lungs: Decreased breath sounds without wheezing or crackles  ,  Abd: Positive bowel sounds, no rebound no guarding, Ext No edema   .  All available labs, radiology testing, previous records reviewed. Agree with documented assessment and plan.    CAD:  Medical management as previously outlined.  Acute on chronic systolic HF:  Transition to PO Lasix .   PVD:  Change to Xarelto  2.5 mg bid.    Royston Cornea Jeanetta Alonzo  12:02 PM  09/15/2023

## 2023-09-15 NOTE — Plan of Care (Signed)
  Problem: Education: Goal: Understanding of cardiac disease, CV risk reduction, and recovery process will improve Outcome: Progressing Goal: Individualized Educational Video(s) Outcome: Progressing   Problem: Activity: Goal: Ability to tolerate increased activity will improve Outcome: Progressing   Problem: Cardiac: Goal: Ability to achieve and maintain adequate cardiovascular perfusion will improve Outcome: Progressing   Problem: Health Behavior/Discharge Planning: Goal: Ability to safely manage health-related needs after discharge will improve Outcome: Progressing   Problem: Education: Goal: Ability to describe self-care measures that may prevent or decrease complications (Diabetes Survival Skills Education) will improve Outcome: Progressing Goal: Individualized Educational Video(s) Outcome: Progressing   Problem: Coping: Goal: Ability to adjust to condition or change in health will improve Outcome: Progressing   Problem: Fluid Volume: Goal: Ability to maintain a balanced intake and output will improve Outcome: Progressing   Problem: Health Behavior/Discharge Planning: Goal: Ability to identify and utilize available resources and services will improve Outcome: Progressing Goal: Ability to manage health-related needs will improve Outcome: Progressing   Problem: Metabolic: Goal: Ability to maintain appropriate glucose levels will improve Outcome: Progressing   Problem: Nutritional: Goal: Maintenance of adequate nutrition will improve Outcome: Progressing Goal: Progress toward achieving an optimal weight will improve Outcome: Progressing   Problem: Skin Integrity: Goal: Risk for impaired skin integrity will decrease Outcome: Progressing   Problem: Tissue Perfusion: Goal: Adequacy of tissue perfusion will improve Outcome: Progressing   Problem: Education: Goal: Knowledge of General Education information will improve Description: Including pain rating scale,  medication(s)/side effects and non-pharmacologic comfort measures Outcome: Progressing   Problem: Health Behavior/Discharge Planning: Goal: Ability to manage health-related needs will improve Outcome: Progressing   Problem: Clinical Measurements: Goal: Ability to maintain clinical measurements within normal limits will improve Outcome: Progressing Goal: Will remain free from infection Outcome: Progressing Goal: Diagnostic test results will improve Outcome: Progressing Goal: Respiratory complications will improve Outcome: Progressing Goal: Cardiovascular complication will be avoided Outcome: Progressing   Problem: Activity: Goal: Risk for activity intolerance will decrease Outcome: Progressing   Problem: Nutrition: Goal: Adequate nutrition will be maintained Outcome: Progressing   Problem: Coping: Goal: Level of anxiety will decrease Outcome: Progressing   Problem: Elimination: Goal: Will not experience complications related to bowel motility Outcome: Progressing Goal: Will not experience complications related to urinary retention Outcome: Progressing   Problem: Pain Management: Goal: General experience of comfort will improve Outcome: Progressing   Problem: Safety: Goal: Ability to remain free from injury will improve Outcome: Progressing   Problem: Skin Integrity: Goal: Risk for impaired skin integrity will decrease Outcome: Progressing   Problem: Education: Goal: Knowledge of disease or condition will improve Outcome: Progressing Goal: Knowledge of the prescribed therapeutic regimen will improve Outcome: Progressing Goal: Individualized Educational Video(s) Outcome: Progressing   Problem: Activity: Goal: Ability to tolerate increased activity will improve Outcome: Progressing Goal: Will verbalize the importance of balancing activity with adequate rest periods Outcome: Progressing   Problem: Respiratory: Goal: Ability to maintain a clear airway will  improve Outcome: Progressing Goal: Levels of oxygenation will improve Outcome: Progressing Goal: Ability to maintain adequate ventilation will improve Outcome: Progressing

## 2023-09-15 NOTE — TOC Progression Note (Signed)
 Transition of Care [facility]) - Progression Note    Patient Details  Name: Richard Rivers MRN: [id number] Date of Birth: 12-20-1945  Transition of Care [facility]) CM/SW Contact  Graves-Bigelow, Jari Merles, RN Phone Number: 09/15/2023, 1:18 PM  Clinical Narrative:  Orders for oxygen submitted to [facility]- received notification that order submitted to [facility] in [place] . Awaiting to see when the DME can be delivered to the hospital. Case Manager called son [name] to see if he has any updated information regarding delivery. Authorization submitted to [facility] for home health PT- [facility] to follow post hospitalization. No further needs identified at this time.    Expected Discharge Plan: Home w Home Health Services Barriers to Discharge: No Barriers Identified  Expected Discharge Plan and Services   Discharge Planning Services: CM Consult ([facility] reached out to [facility] for [facility] health [facility].) Post Acute Care Choice: Home Health Living arrangements for the past 2 months: Single Family Home                 DME Arranged: Oxygen DME Agency: [facility], [place] Date DME Agency Contacted: 09/15/23 Time DME Agency Contacted: 0930 Representative spoke with at DME Agency: [facility] HH Arranged: PT HH Agency: [facility] Date [facility] Agency Contacted: 09/14/23 Time HH Agency Contacted: 1552 Representative spoke with at [facility] Agency: Randel Buss   Social Determinants of Health (SDOH) Interventions SDOH Screenings   Food Insecurity: No Food Insecurity (09/12/2023)  Housing: Low Risk  (09/13/2023)  Transportation Needs: No Transportation Needs (09/13/2023)  Utilities: Not At Risk (09/12/2023)  Alcohol Screen: Low Risk  (09/13/2023)  Financial Resource Strain: Low Risk  (09/13/2023)  Social Connections: Socially Isolated (09/12/2023)  Tobacco Use: High Risk (09/12/2023)    Readmission Risk Interventions     No data to display

## 2023-09-16 DIAGNOSIS — I214 Non-ST elevation (NSTEMI) myocardial infarction: Secondary | ICD-10-CM | POA: Diagnosis not present

## 2023-09-16 LAB — CBC
HCT: 28.7 % — ABNORMAL LOW (ref 39.0–52.0)
Hemoglobin: 8.9 g/dL — ABNORMAL LOW (ref 13.0–17.0)
MCH: 28 pg (ref 26.0–34.0)
MCHC: 31 g/dL (ref 30.0–36.0)
MCV: 90.3 fL (ref 80.0–100.0)
Platelets: 135 10*3/uL — ABNORMAL LOW (ref 150–400)
RBC: 3.18 MIL/uL — ABNORMAL LOW (ref 4.22–5.81)
RDW: 15.1 % (ref 11.5–15.5)
WBC: 9.5 10*3/uL (ref 4.0–10.5)
nRBC: 0 % (ref 0.0–0.2)

## 2023-09-16 LAB — BASIC METABOLIC PANEL
Anion gap: 7 (ref 5–15)
BUN: 51 mg/dL — ABNORMAL HIGH (ref 8–23)
CO2: 33 mmol/L — ABNORMAL HIGH (ref 22–32)
Calcium: 8.8 mg/dL — ABNORMAL LOW (ref 8.9–10.3)
Chloride: 97 mmol/L — ABNORMAL LOW (ref 98–111)
Creatinine, Ser: 1.15 mg/dL (ref 0.61–1.24)
GFR, Estimated: >60 mL/min (ref >60)
Glucose, Bld: 128 mg/dL — ABNORMAL HIGH (ref 70–99)
Potassium: 3.7 mmol/L (ref 3.5–5.1)
Sodium: 137 mmol/L (ref 135–145)

## 2023-09-16 LAB — GLUCOSE, CAPILLARY
Glucose-Capillary: 101 mg/dL — ABNORMAL HIGH (ref 70–99)
Glucose-Capillary: 174 mg/dL — ABNORMAL HIGH (ref 70–99)

## 2023-09-16 MED ORDER — FUROSEMIDE 20 MG PO TABS: 40.0000 mg | ORAL_TABLET | Freq: Every day | ORAL | 11 refills | Status: DC

## 2023-09-16 NOTE — Plan of Care (Signed)
Problem: Education: Goal: Understanding of cardiac disease, CV risk reduction, and recovery process will improve Outcome: Adequate for Discharge Goal: Individualized Educational Video(s) Outcome: Adequate for Discharge   Problem: Activity: Goal: Ability to tolerate increased activity will improve Outcome: Adequate for Discharge   Problem: Cardiac: Goal: Ability to achieve and maintain adequate cardiovascular perfusion will improve Outcome: Adequate for Discharge   Problem: Health Behavior/Discharge Planning: Goal: Ability to safely manage health-related needs after discharge will improve Outcome: Adequate for Discharge   Problem: Education: Goal: Ability to describe self-care measures that may prevent or decrease complications (Diabetes Survival Skills Education) will improve Outcome: Adequate for Discharge Goal: Individualized Educational Video(s) Outcome: Adequate for Discharge   Problem: Coping: Goal: Ability to adjust to condition or change in health will improve Outcome: Adequate for Discharge   Problem: Fluid Volume: Goal: Ability to maintain a balanced intake and output will improve Outcome: Adequate for Discharge   Problem: Health Behavior/Discharge Planning: Goal: Ability to identify and utilize available resources and services will improve Outcome: Adequate for Discharge Goal: Ability to manage health-related needs will improve Outcome: Adequate for Discharge   Problem: Metabolic: Goal: Ability to maintain appropriate glucose levels will improve Outcome: Adequate for Discharge   Problem: Nutritional: Goal: Maintenance of adequate nutrition will improve Outcome: Adequate for Discharge Goal: Progress toward achieving an optimal weight will improve Outcome: Adequate for Discharge   Problem: Skin Integrity: Goal: Risk for impaired skin integrity will decrease Outcome: Adequate for Discharge   Problem: Tissue Perfusion: Goal: Adequacy of tissue perfusion  will improve Outcome: Adequate for Discharge   Problem: Education: Goal: Knowledge of General Education information will improve Description: Including pain rating scale, medication(s)/side effects and non-pharmacologic comfort measures Outcome: Adequate for Discharge   Problem: Health Behavior/Discharge Planning: Goal: Ability to manage health-related needs will improve Outcome: Adequate for Discharge   Problem: Clinical Measurements: Goal: Ability to maintain clinical measurements within normal limits will improve Outcome: Adequate for Discharge Goal: Will remain free from infection Outcome: Adequate for Discharge Goal: Diagnostic test results will improve Outcome: Adequate for Discharge Goal: Respiratory complications will improve Outcome: Adequate for Discharge Goal: Cardiovascular complication will be avoided Outcome: Adequate for Discharge   Problem: Activity: Goal: Risk for activity intolerance will decrease Outcome: Adequate for Discharge   Problem: Nutrition: Goal: Adequate nutrition will be maintained Outcome: Adequate for Discharge   Problem: Coping: Goal: Level of anxiety will decrease Outcome: Adequate for Discharge   Problem: Elimination: Goal: Will not experience complications related to bowel motility Outcome: Adequate for Discharge Goal: Will not experience complications related to urinary retention Outcome: Adequate for Discharge   Problem: Pain Management: Goal: General experience of comfort will improve Outcome: Adequate for Discharge   Problem: Safety: Goal: Ability to remain free from injury will improve Outcome: Adequate for Discharge   Problem: Skin Integrity: Goal: Risk for impaired skin integrity will decrease Outcome: Adequate for Discharge   Problem: Education: Goal: Knowledge of disease or condition will improve Outcome: Adequate for Discharge Goal: Knowledge of the prescribed therapeutic regimen will improve Outcome: Adequate for  Discharge Goal: Individualized Educational Video(s) Outcome: Adequate for Discharge   Problem: Activity: Goal: Ability to tolerate increased activity will improve Outcome: Adequate for Discharge Goal: Will verbalize the importance of balancing activity with adequate rest periods Outcome: Adequate for Discharge   Problem: Respiratory: Goal: Ability to maintain a clear airway will improve Outcome: Adequate for Discharge Goal: Levels of oxygenation will improve Outcome: Adequate for Discharge Goal: Ability to maintain adequate ventilation  will improve Outcome: Adequate for Discharge

## 2023-09-16 NOTE — Care Management Important Message (Signed)
Important Message  Patient Details  Name: Richard Rivers MRN: [id number] Date of Birth: 1946/01/27   Important Message Given:  Yes - Medicare IM     Renie Ora 09/16/2023, 10:29 AM

## 2023-09-16 NOTE — Progress Notes (Addendum)
Patient Name: Richard Rivers Date of Encounter: 09/16/2023 [facility] Cardiologist: None    Interval Summary  .    Patient reports feeling well this AM. No chest pain, palpitations, shortness of breath. Hopeful he can go home today. Has an appointment with his cardiologist through the [place] next week.   Vital Signs .    Vitals:   09/15/23 1629 09/15/23 2008 09/16/23 0450 09/16/23 0700  BP: (!) 140/75 (!) 124/47 129/66   Pulse:      Resp: 18 18 17    Temp: 97.9 F (36.6 C) 97.8 F (36.6 C) 98 F (36.7 C)   TempSrc: Oral Oral Oral   SpO2:      Weight:    65.5 kg  Height:        Intake/Output Summary (Last 24 hours) at 09/16/2023 0901 Last data filed at 09/15/2023 0904 Gross per 24 hour  Intake --  Output 200 ml  Net -200 ml      09/16/2023    7:00 AM 09/15/2023    5:28 AM 09/14/2023    5:16 AM  Last 3 Weights  Weight (lbs) 144 lb 4.8 oz 144 lb 13.5 oz 144 lb 1.6 oz  Weight (kg) 65.454 kg 65.7 kg 65.363 kg      Telemetry/ECG    Sinus rhythm with PVCs and PACs - Personally Reviewed  Physical Exam .   GEN: No acute distress.  Sitting comfortably on the side of the bed  Neck: No JVD Cardiac:  RRR, occasional skipped beat. no murmurs, rubs, or gallops. Radial pulses 2+ bilaterally  Respiratory: Clear to auscultation bilaterally. Normal WOB on room air  GI: Soft, nontender, non-distended  MS: No edema in BLE   Assessment & Plan .     NSTEMI  HLD  - [facility] of his LM in 02/2022. 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 hsTn [phone]. EKG showed diffuse ST depression in the inferior and and ant-lat leads  - Limited echo from 1/13 showed EF 35-40%, global hypokinesis, grade II DD, normal RV, PASP 45.8, mild-moderate MR, moderate TR - Patient had AKI on  arrival, so did not undergo LHC. Treated with 48 hours of IV heparin and nitroglycerin. Chest pain resolved. Given AKI and chronic complex medical history including PAD, COPD, patient wishes to continue medical therapy. Agree this is reasonable. If chest pain is refractory to medical management, could reconsider cath later on  - Continue plavix 75 mg daily  - Continue lipitor 80 mg daily - LDL 48  - Continue carvedilol 12.5 mg BID - Continue imdur 30 mg daily  - PTA- patient was on xarelto 20 mg and ASA 81 mg for extensive PAD. We reduced xarelto to 2.5 mg BID to reduce bleeding risk  Acute on Chronic Systolic Heart Failure  HTN - BP was 2799 on 1/12. Was treated with IV lasix.  Currently net -2.4 L since admission  - Euvolemic on exam today. Creatinine stable at 1.15 - Continue PO lasix 40 mg daily  - Continue carvedilol 12.5 mg BID, imdur 30 mg daily, entresto 24/26 mg BID  PVCs  PACs  - Patient noted to have PVCs and PACs on telemetry. Denies palpitations  - Continue carvedilol  -  K 3.7, mag 2.2  Recommend follow up with his cardiologist through the [place] within 2 weeks. Needs BMP in 1 week.   Otherwise per primary  - Flu A  - AKI  - COPD exacerbation  - Anemia  - Gout  - Depression, anxiety  - Tobacco use  - PAD   For questions or updates, please contact [facility] Please consult [email or web address] for contact info under     Signed, Jonita Albee, PA-C   History and all data above reviewed.  Patient examined.  I agree with the findings as above.   The patient is breathing much better than he was on admission.  He is not having any chest discomfort.  He is on 2 L of oxygen and home O2 has been arranged.  The patient exam reveals COR: Regular rate and rhythm,  Lungs: Clear to auscultation bilaterally,  Abd: Positive bowel sounds normal frequency pitch, bruits, rebound, guarding, Ext 2+ pulse, no edema..  All available labs, radiology testing, previous records  reviewed. Agree with documented assessment and plan.  Acute on chronic systolic heart failure seems to be doing well with the meds as listed.  Continue to follow-up and he will need close follow-up of his volume and his renal function.  Coronary artery disease: He seems to have done well with acute management of his pulmonary process and volume overload.  [facility] and [facility]s as listed.  [name]  11:10 AM  09/16/2023

## 2023-09-16 NOTE — Progress Notes (Addendum)
Occupational Therapy Treatment Patient Details Name: Richard Rivers MRN: [id number] DOB: 1946-04-02 Today's Date: 09/16/2023   History of present illness 78 y/o male presented to ED on 09/01/23 for worsening SOB. Admitted for acute CHF exacerbation. PMH: CAD with stent placement, HTN, HLD, gout, depression, anxiety, tobacco abuse   OT comments  Pt making good progress with functional goals. Reviewed energy conservation strategies for ADLs an ADL mobility and shower seating for home use. eager to d/c home this afternoon      If plan is discharge home, recommend the following:  A little help with walking and/or transfers;A little help with bathing/dressing/bathroom;Assistance with cooking/housework;Assist for transportation;Help with stairs or ramp for entrance   Equipment Recommendations  Tub/shower seat    Recommendations for Other Services      Precautions / Restrictions Precautions Precautions: Fall Precaution Comments: watch O2 Restrictions Weight Bearing Restrictions Per Provider Order: No       Mobility Bed Mobility Overal bed mobility: Modified Independent Bed Mobility: Supine to Sit, Sit to Supine     Supine to sit: Modified independent (Device/Increase time) Sit to supine: Modified independent (Device/Increase time)        Transfers Overall transfer level: Needs assistance Equipment used: Rolling walker (2 wheels) Transfers: Sit to/from Stand, Bed to chair/wheelchair/BSC Sit to Stand: Contact guard assist     Step pivot transfers: Supervision           Balance Overall balance assessment: Needs assistance Sitting-balance support: Feet supported, No upper extremity supported Sitting balance-[name]: Good     Standing balance support: Bilateral upper extremity supported, No upper extremity supported, During functional activity Standing balance-[name]: Fair                             ADL either performed or assessed with clinical  judgement   ADL Overall ADL's : Needs assistance/impaired     Grooming: Wash/dry hands;Wash/dry face;Supervision/safety;Standing       Lower Body Bathing: Contact guard assist;Sitting/lateral leans;Sit to/from stand Lower Body Bathing Details (indicate cue type and reason): simulated     Lower Body Dressing: Contact guard assist;Sitting/lateral leans;Sit to/from stand   Toilet Transfer: Supervision/safety;Ambulation;Rolling walker (2 wheels)   Toileting- Clothing Manipulation and Hygiene: Modified independent;Sit to/from stand       Functional mobility during ADLs: Contact guard assist;Supervision/safety General ADL Comments: pt educated on energy conservation strategies    Extremity/Trunk Assessment Upper Extremity Assessment Upper Extremity Assessment: Overall WFL for tasks assessed;Generalized weakness   Lower Extremity Assessment Lower Extremity Assessment: Defer to PT evaluation   Cervical / Trunk Assessment Cervical / Trunk Assessment: Normal    Vision Ability to See in Adequate Light: 0 Adequate Patient Visual Report: No change from baseline     Perception     Praxis      Cognition Arousal: Alert Behavior During Therapy: WFL for tasks assessed/performed Overall Cognitive Status: Within Functional Limits for tasks assessed                                          Exercises      Shoulder Instructions       General Comments      Pertinent Vitals/ Pain       Pain Assessment Pain Assessment: No/denies pain Faces Pain Scale: No hurt  Home Living  Prior Functioning/Environment              Frequency  Min 1X/week        Progress Toward Goals  OT Goals(current goals can now be found in the care plan section)  Progress towards OT goals: Progressing toward goals     Plan      Co-evaluation                 AM-PAC OT "6 Clicks" Daily Activity      Outcome Measure   Help from another person eating meals?: None Help from another person taking care of personal grooming?: A Little Help from another person toileting, which includes using toliet, bedpan, or urinal?: A Little Help from another person bathing (including washing, rinsing, drying)?: A Little Help from another person to put on and taking off regular upper body clothing?: None Help from another person to put on and taking off regular lower body clothing?: A Little 6 Click Score: 20    End of Session Equipment Utilized During Treatment: Gait belt;Rolling walker (2 wheels);Oxygen  OT Visit Diagnosis: Other abnormalities of gait and mobility (R26.89);History of falling (Z91.81)   Activity Tolerance Patient tolerated treatment well   Patient Left in bed;with call bell/phone within reach   Nurse Communication          Time: 1022-1040 OT Time Calculation (min): 18 min  Charges: OT General Charges $OT Visit: 1 Visit OT Treatments $Self Care/Home Management : 8-22 mins    Galen Manila 09/16/2023, 12:37 PM

## 2023-09-16 NOTE — Discharge Summary (Signed)
Physician Discharge Summary  Richard Rivers:[id number] DOB: August 07, 1946 DOA: 09/12/2023  PCP: System, Provider Not In  Admit date: 09/12/2023 Discharge date: 09/16/2023  Admitted From: Home Disposition: Home with home health  Recommendations for Outpatient Follow-up:  Follow up with PCP in 1-2 weeks Please obtain BMP/CBC in one week Cardiology follow-up at the [facility] center.  Home Health: PT and OT Equipment/Devices: Oxygen  Discharge Condition: Fair CODE STATUS: Full code Diet recommendation: Low-salt diet  Discharge summary: 78 year old gentleman with history of hypertension, hyperlipidemia, coronary artery disease, history of depression, anxiety and smoker, COPD presented with about 4 days of shortness of breath, chest pain and diarrhea. In the emergency room 99% on 2 L oxygen. Respiratory virus panel positive for influenza A. BNP 2799. Troponin is 2877. Chest x-ray with chronic findings. EKG with nonischemic EKG changes. Cardiology was consulted, started on heparin drip and admitted to the hospital.  Patient was treated for influenza A, acute on chronic congestive heart failure and non-STEMI.  Non-ST elevation MI: Patient declined to have cardiac cath and also recommended medical management. Limited echocardiogram similar to one from 09/03/2023.  EF 35 to 40%. Treating medically, treated with Plavix, heparin and nitroglycerin infusion.  Medically stabilizing.   Now on Plavix and Xarelto.  Tolerating Imdur.  Already on carvedilol.   Clinically improved.  Going home on above regimen.   Acute on chronic combined heart failure: Presented with shortness of breath with minimal activity.  Known EF of 35 to 40%.  Received IV diuresis with good clinical response.  Going home with Lasix 40 mg daily along with other GDMT.   COPD with exacerbation secondary to influenza A infection: Tamiflu for 5 days, will complete therapy today. Prednisone for 5 days.  Completed therapy today. He will  continue to use albuterol as needed at home. Patient was needing supplemental oxygen of 2 L with mobility, will be prescribed.   AKI: Likely prerenal.  Baseline creatinine 1-1.1.  Improved and normalized.   Essential hypertension: Blood pressure is stable on carvedilol and Imdur.  Lisinopril was discontinued during hospitalization.  He is started on low-dose Entresto.    Peripheral artery disease: Status post right femoral peroneal bypass in 2023.  Patient on Xarelto at home.  Xarelto resumed. Noted that dose of Xarelto was reduced from 20 mg daily to 2.5 mg twice daily.  He is additionally on Plavix.   Depression and anxiety: On trazodone and BuSpar.   Smoker: Counseled to quit.   Stable for discharge.   Discharge Diagnoses:  Principal Problem:   NSTEMI (non-ST elevated myocardial infarction) [facility]) Active Problems:   Malnutrition of moderate degree    Discharge Instructions   Allergies as of 09/16/2023       Reactions   Codeine Itching, Swelling   Throat swelling   Penicillin G Swelling   Throat        Medication List     STOP taking these medications    doxycycline 100 MG tablet Commonly known as: ADOXA   lisinopril 40 MG tablet Commonly known as: ZESTRIL       TAKE these medications    acetaminophen 325 MG tablet Commonly known as: TYLENOL Take 650 mg by mouth 3 (three) times daily as needed.   allopurinol 300 MG tablet Commonly known as: ZYLOPRIM Take 300 mg by mouth daily.   ammonium lactate 12 % lotion Commonly known as: LAC-HYDRIN Apply 1 Application topically 2 (two) times daily as needed. APPLY TO DRY SKIN ON FEET  atorvastatin 80 MG tablet Commonly known as: LIPITOR Take 80 mg by mouth at bedtime.   busPIRone 10 MG tablet Commonly known as: BUSPAR Take 10 mg by mouth 2 (two) times daily as needed.   carvedilol 12.5 MG tablet Commonly known as: COREG Take 12.5 mg by mouth 2 (two) times daily with a meal.   clopidogrel 75 MG  tablet Commonly known as: PLAVIX Take 1 tablet (75 mg total) by mouth daily.   colchicine 0.6 MG tablet Take 0.6 mg by mouth daily.   Combivent Respimat 20-100 MCG/ACT Aers respimat Generic drug: Ipratropium-Albuterol Inhale 1 puff into the lungs 4 (four) times daily.   docusate sodium 50 MG capsule Commonly known as: COLACE Take 50 mg by mouth daily as needed for mild constipation. Per son, patient has this medication but doesn't use often   Entresto 24-26 MG Generic drug: sacubitril-valsartan Take 1 tablet by mouth 2 (two) times daily.   famotidine 20 MG tablet Commonly known as: PEPCID Take 20 mg by mouth daily.   furosemide 20 MG tablet Commonly known as: Lasix Take 2 tablets (40 mg total) by mouth daily. Do not dispense earlier prescription of 20 mg daily. What changed:  how much to take additional instructions   gabapentin 300 MG capsule Commonly known as: NEURONTIN Take 600 mg by mouth 3 (three) times daily.   guaiFENesin 600 MG 12 hr tablet Commonly known as: MUCINEX Take 600 mg by mouth every 12 (twelve) hours as needed for cough.   hydrOXYzine 25 MG tablet Commonly known as: ATARAX Take 25 mg by mouth 2 (two) times daily as needed for anxiety.   isosorbide mononitrate 30 MG 24 hr tablet Commonly known as: IMDUR Take 1 tablet (30 mg total) by mouth daily.   nitroGLYCERIN 0.4 MG SL tablet Commonly known as: NITROSTAT Place 1 tablet (0.4 mg total) under the tongue every 5 (five) minutes x 3 doses as needed for chest pain.   rivaroxaban 2.5 MG Tabs tablet Commonly known as: XARELTO Take 1 tablet (2.5 mg total) by mouth 2 (two) times daily. What changed:  medication strength how much to take when to take this additional instructions   traZODone 50 MG tablet Commonly known as: DESYREL Take 1-3 tablets by mouth at bedtime. Take 2 tablets at night               Durable Medical Equipment  (From admission, onward)           Start      Ordered   09/15/23 0932  For home use only DME oxygen  Once       Comments: Patient Saturations on Room Air at Rest = 94%   Patient Saturations on Room Air while Ambulating = 86%   Patient Saturations on 2 Liters of oxygen while Ambulating = 93%  Question Answer Comment  Length of Need Lifetime   Mode or (Route) Nasal cannula   Liters per Minute 2   Frequency Continuous (stationary and portable oxygen unit needed)   Oxygen conserving device Yes   Oxygen delivery system Gas      09/15/23 0932            Follow-up Information     [facility], [facility] Follow up.   Specialty: Home Health Services Why: Home Health Physical Therapy-office to call with visit times. Contact information: [address] [place] [address] [phone]         Center, [facility] Follow up.  Specialty: General Practice Why: Oxygen to be delivered to the room. Contact information: [address] [place] [address] [phone]                Allergies  Allergen Reactions   Codeine Itching and Swelling    Throat swelling   Penicillin G Swelling    Throat    Consultations: Cardiology   Procedures/Studies: ECHOCARDIOGRAM LIMITED Result Date: 09/13/2023    ECHOCARDIOGRAM LIMITED REPORT   Patient Name:   KAYNE COULTAS Date of Exam: 09/13/2023 Medical Rec #:  [id number]        Height:       72.0 in Accession #:    [id number]       Weight:       143.6 lb Date of Birth:  01/18/1946        BSA:          1.851 m Patient Age:    78 years         BP:           136/70 mmHg Patient Gender: M                HR:           88 bpm. Exam Location:  Inpatient Procedure: Limited Echo and Limited Color Doppler Indications:    Acute myocardial infarction, unspecified I21.9  History:        Patient has prior history of Echocardiogram examinations, most                 recent 09/03/2023. CHF, Previous Myocardial Infarction and CAD,                 COPD, PAD and Pulmonary HTN; Risk  Factors:Dyslipidemia.  Sonographer:    Lucendia Herrlich RCS Referring Phys: [id number] SAGAR H JINWALA IMPRESSIONS  1. Left ventricular ejection fraction, by estimation, is 35 to 40%. The left ventricle has moderately decreased function. The left ventricle demonstrates global hypokinesis. Left ventricular diastolic parameters are consistent with Grade II diastolic dysfunction (pseudonormalization).  2. Right ventricular systolic function is normal. The right ventricular size is normal. There is moderately elevated pulmonary artery systolic pressure. The estimated right ventricular systolic pressure is 45.8 mmHg.  3. The mitral valve is normal in structure. Mild to moderate mitral valve regurgitation.  4. Tricuspid valve regurgitation is moderate.  5. The aortic valve is tricuspid. Aortic valve regurgitation is not visualized.  6. The inferior vena cava is normal in size with greater than 50% respiratory variability, suggesting right atrial pressure of 3 mmHg. Comparison(s): No significant change from prior study. Conclusion(s)/Recommendation(s): Consider an ischemic evaluation. FINDINGS  Left Ventricle: Left ventricular ejection fraction, by estimation, is 35 to 40%. The left ventricle has moderately decreased function. The left ventricle demonstrates global hypokinesis. There is no left ventricular hypertrophy. Left ventricular diastolic parameters are consistent with Grade II diastolic dysfunction (pseudonormalization). Right Ventricle: The right ventricular size is normal. Right ventricular systolic function is normal. There is moderately elevated pulmonary artery systolic pressure. The tricuspid regurgitant velocity is 3.27 m/s, and with an assumed right atrial pressure of 3 mmHg, the estimated right ventricular systolic pressure is 45.8 mmHg. Pericardium: There is no evidence of pericardial effusion. Mitral Valve: The mitral valve is normal in structure. Mild to moderate mitral valve regurgitation. Tricuspid  Valve: Tricuspid valve regurgitation is moderate. Aortic Valve: The aortic valve is tricuspid. Aortic valve regurgitation is not visualized. Aortic valve peak gradient measures 3.8 mmHg. Venous: The  inferior vena cava is normal in size with greater than 50% respiratory variability, suggesting right atrial pressure of 3 mmHg. LEFT VENTRICLE PLAX 2D LVIDd:         5.60 cm   Diastology LVIDs:         5.00 cm   LV e' medial:    4.58 cm/s LV PW:         1.00 cm   LV E/e' medial:  22.1 LV IVS:        0.90 cm   LV e' lateral:   9.83 cm/s LVOT diam:     2.00 cm   LV E/e' lateral: 10.3 LV SV:         49 LV SV Index:   26 LVOT Area:     3.14 cm  RIGHT VENTRICLE             IVC RV S prime:     14.40 cm/s  IVC diam: 1.20 cm TAPSE (M-mode): 1.6 cm LEFT ATRIUM         Index LA diam:    3.90 cm 2.11 cm/m  AORTIC VALVE AV Area (Vmax): 2.92 cm AV Vmax:        97.70 cm/s AV Peak Grad:   3.8 mmHg LVOT Vmax:      90.73 cm/s LVOT Vmean:     58.967 cm/s LVOT VTI:       0.155 m  AORTA Ao Root diam: 3.40 cm Ao Asc diam:  3.20 cm MITRAL VALVE                TRICUSPID VALVE MV Area (PHT): 5.97 cm     TR Peak grad:   42.8 mmHg MV Decel Time: 127 msec     TR Vmax:        327.00 cm/s MV E velocity: 101.00 cm/s MV A velocity: 51.90 cm/s   SHUNTS MV E/A ratio:  1.95         Systemic VTI:  0.15 m                             Systemic Diam: 2.00 cm Carolan Clines Electronically signed by Carolan Clines Signature Date/Time: 09/13/2023/9:41:36 AM    Final    DG Chest Port 1 View Result Date: 09/12/2023 CLINICAL DATA:  Shortness of breath. EXAM: PORTABLE CHEST 1 VIEW COMPARISON:  09/01/2023 FINDINGS: Stable asymmetric elevation right hemidiaphragm. Near complete resolution of pleural effusions seen previously. Interstitial markings are diffusely coarsened with chronic features. Chronic atelectasis or scarring noted right base. No focal consolidation or pleural effusion. Cardiopericardial silhouette is at upper limits of normal for size. Bones are  diffusely demineralized. IMPRESSION: Chronic interstitial coarsening without acute cardiopulmonary findings. Pleural effusions seen previously have essentially resolved. Electronically Signed   By: Kennith Center M.D.   On: 09/12/2023 13:27   ECHOCARDIOGRAM COMPLETE Result Date: 09/03/2023    ECHOCARDIOGRAM REPORT   Patient Name:   ANIL CLAYTOR Date of Exam: 09/03/2023 Medical Rec #:  [id number]        Height:       72.0 in Accession #:    [id number]       Weight:       145.2 lb Date of Birth:  [birth year]        BSA:          1.859 m Patient Age:    37 years  BP:           141/79 mmHg Patient Gender: M                HR:           91 bpm. Exam Location:  [facility] Procedure: 2D Echo, Cardiac Doppler, Color Doppler and Intracardiac            Opacification Agent Indications:     CHF  History:         Patient has prior history of Echocardiogram examinations, most                  recent 10/07/2009. CHF, CAD, PAD, COPD and Stroke; Risk                  Factors:Hypertension, Dyslipidemia and Current Smoker. Pleural                  effusion.  Sonographer:     Mikki Harbor Referring Phys:  [id number] Brien Few NIU Diagnosing Phys: Alwyn Pea MD  Sonographer Comments: Technically difficult study due to poor echo windows. Image acquisition challenging due to COPD and Image acquisition challenging due to respiratory motion. IMPRESSIONS  1. Consider Infiltrative process.  2. Pul HTN.  3. Left ventricular ejection fraction, by estimation, is 35 to 40%. The left ventricle has moderately decreased function. The left ventricle demonstrates global hypokinesis. The left ventricular internal cavity size was mildly dilated. Left ventricular diastolic parameters are consistent with Grade II diastolic dysfunction (pseudonormalization).  4. Right ventricular systolic function is low normal. The right ventricular size is mildly enlarged. Mildly increased right ventricular wall thickness. There is severely elevated pulmonary artery  systolic pressure.  5. Left atrial size was mild to moderately dilated.  6. Right atrial size was mild to moderately dilated.  7. The mitral valve is grossly normal. Mild mitral valve regurgitation.  8. Tricuspid valve regurgitation is mild to moderate.  9. The aortic valve is grossly normal. Aortic valve regurgitation is trivial. FINDINGS  Left Ventricle: Left ventricular ejection fraction, by estimation, is 35 to 40%. The left ventricle has moderately decreased function. The left ventricle demonstrates global hypokinesis. Definity contrast agent was given IV to delineate the left ventricular endocardial borders. The left ventricular internal cavity size was mildly dilated. There is borderline concentric left ventricular hypertrophy. Left ventricular diastolic parameters are consistent with Grade II diastolic dysfunction (pseudonormalization). Right Ventricle: The right ventricular size is mildly enlarged. Mildly increased right ventricular wall thickness. Right ventricular systolic function is low normal. There is severely elevated pulmonary artery systolic pressure. The tricuspid regurgitant  velocity is 4.14 m/s, and with an assumed right atrial pressure of 15 mmHg, the estimated right ventricular systolic pressure is 83.6 mmHg. Left Atrium: Left atrial size was mild to moderately dilated. Right Atrium: Right atrial size was mild to moderately dilated. Pericardium: Trivial pericardial effusion is present. Mitral Valve: The mitral valve is grossly normal. Mild mitral valve regurgitation. MV peak gradient, 5.1 mmHg. The mean mitral valve gradient is 2.0 mmHg. Tricuspid Valve: The tricuspid valve is grossly normal. Tricuspid valve regurgitation is mild to moderate. Aortic Valve: The aortic valve is grossly normal. Aortic valve regurgitation is trivial. Aortic valve mean gradient measures 2.0 mmHg. Aortic valve peak gradient measures 5.2 mmHg. Aortic valve area, by VTI measures 2.17 cm. Pulmonic Valve: The pulmonic  valve was normal in structure. Pulmonic valve regurgitation is trivial. Aorta: The ascending aorta was not well visualized. IAS/Shunts: No atrial level  shunt detected by color flow Doppler. Additional Comments: Consider Infiltrative process. Pul HTN.  LEFT VENTRICLE PLAX 2D LVIDd:         5.30 cm   Diastology LVIDs:         4.30 cm   LV e' lateral:   7.18 cm/s LV PW:         1.10 cm   LV E/e' lateral: 15.9 LV IVS:        1.20 cm LVOT diam:     2.00 cm LV SV:         49 LV SV Index:   26 LVOT Area:     3.14 cm  RIGHT VENTRICLE RV Basal diam:  3.25 cm RV Mid diam:    3.10 cm RV S prime:     11.50 cm/s TAPSE (M-mode): 2.2 cm LEFT ATRIUM              Index        RIGHT ATRIUM           Index LA diam:        4.50 cm  2.42 cm/m   RA Area:     17.60 cm LA Vol (A2C):   103.0 ml 55.39 ml/m  RA Volume:   49.90 ml  26.84 ml/m LA Vol (A4C):   87.3 ml  46.95 ml/m LA Biplane Vol: 97.6 ml  52.49 ml/m  AORTIC VALVE                    PULMONIC VALVE AV Area (Vmax):    2.35 cm     PV Vmax:       0.80 m/s AV Area (Vmean):   2.03 cm     PV Peak grad:  2.6 mmHg AV Area (VTI):     2.17 cm AV Vmax:           114.00 cm/s AV Vmean:          71.700 cm/s AV VTI:            0.226 m AV Peak Grad:      5.2 mmHg AV Mean Grad:      2.0 mmHg LVOT Vmax:         85.40 cm/s LVOT Vmean:        46.300 cm/s LVOT VTI:          0.156 m LVOT/AV VTI ratio: 0.69  AORTA Ao Root diam: 3.50 cm MITRAL VALVE                TRICUSPID VALVE MV Area (PHT): 5.09 cm     TR Peak grad:   68.6 mmHg MV Area VTI:   1.88 cm     TR Vmax:        414.00 cm/s MV Peak grad:  5.1 mmHg MV Mean grad:  2.0 mmHg     SHUNTS MV Vmax:       1.13 m/s     Systemic VTI:  0.16 m MV Vmean:      60.2 cm/s    Systemic Diam: 2.00 cm MV Decel Time: 149 msec MV E velocity: 114.00 cm/s MV A velocity: 77.00 cm/s MV E/A ratio:  1.48 Dwayne D Callwood MD Electronically signed by Alwyn Pea MD Signature Date/Time: 09/03/2023/3:50:33 PM    Final    CT CHEST WO CONTRAST Result Date:  09/02/2023 CLINICAL DATA:  Lung opacity, cough, pleural effusions EXAM: CT CHEST WITHOUT CONTRAST TECHNIQUE: Multidetector CT imaging of the  chest was performed following the standard protocol without IV contrast. RADIATION DOSE REDUCTION: This exam was performed according to the departmental dose-optimization program which includes automated exposure control, adjustment of the mA and/or kV according to patient size and/or use of iterative reconstruction technique. COMPARISON:  None Available. FINDINGS: Cardiovascular: Extensive coronary and aortic calcified atheromatous plaque. Fusiform 3.4 cm dilatation of proximal descending thoracic aorta. Heart size upper limits normal. Small pericardial effusion. Mediastinum/Nodes: No mediastinal hematoma or mass. Scattered subcentimeter mediastinal lymph nodes, none greater than 1 cm short axis diameter. No definite hilar adenopathy, sensitivity decreased without IV contrast. Lungs/Pleura: Small pleural effusions. No pneumothorax. Pulmonary emphysema. Coarse airspace opacities with adjacent subpleural cystic changes in the posterior right upper lobe abutting the fissure. Mild dependent atelectasis in both lower lobes. 3 mm subpleural nodules in the superior segment left lower lobe (Im93,Se4) and lateral right upper lobe image 73. Upper Abdomen: Bilateral renal cystic lesions, incompletely characterized. Calcifications in bilateral renal hila may be atherosclerotic or nephrolithiasis. No definite hydronephrosis in visualized portions. No acute findings. Musculoskeletal: No chest wall mass or suspicious bone lesions identified. IMPRESSION: 1. Small pleural effusions with mild dependent atelectasis. 2. Coarse airspace opacities with adjacent subpleural cystic changes in the posterior right upper lobe, possibly scarring. 3. 3 mm subpleural nodules in the right upper and left lower lobes. No follow-up needed if patient is low-risk.This recommendation follows the consensus  statement: Guidelines for Management of Incidental Pulmonary Nodules Detected on CT Images: From the Fleischner Society 2017; Radiology 2017; 284:228-243. 4. Aortic Atherosclerosis (ICD10-I70.0) and Emphysema (ICD10-J43.9). Electronically Signed   By: Corlis Leak M.D.   On: 09/02/2023 15:26   DG Chest 2 View Result Date: 09/01/2023 CLINICAL DATA:  SOB EXAM: CHEST - 2 VIEW COMPARISON:  11/05/2014 FINDINGS: New moderate right-sided and small left-sided bilateral pleural effusions. Dependent atelectasis above the effusions in the lung bases. No pneumothorax. Calcified aorta. Cardiac silhouette prominent. Osseous structures are intact. IMPRESSION: Bilateral pleural effusions. Enlarged cardiac silhouette. Mild bibasilar consolidation or volume loss. Electronically Signed   By: Layla Maw M.D.   On: 09/01/2023 19:11   (Echo, Carotid, EGD, Colonoscopy, ERCP)    Subjective: Patient seen and examined.  No overnight events.  Oxygen is delivered.  Eager to go home.  He thinks he will only need oxygen or with walking.   Discharge Exam: Vitals:   09/15/23 2008 09/16/23 0450  BP: (!) 124/47 129/66  Pulse:    Resp: 18 17  Temp: 97.8 F (36.6 C) 98 F (36.7 C)  SpO2:     Vitals:   09/15/23 1629 09/15/23 2008 09/16/23 0450 09/16/23 0700  BP: (!) 140/75 (!) 124/47 129/66   Pulse:      Resp: 18 18 17    Temp: 97.9 F (36.6 C) 97.8 F (36.6 C) 98 F (36.7 C)   TempSrc: Oral Oral Oral   SpO2:      Weight:    65.5 kg  Height:        General: Pt is alert, awake, not in acute distress Frail and debilitated.  Chronically sick looking.  Not in any distress. Cardiovascular: RRR, S1/S2 +, no rubs, no gallops Respiratory: CTA bilaterally, no wheezing, no rhonchi, no added sounds. Abdominal: Soft, NT, ND, bowel sounds + Extremities: no edema, no cyanosis    The results of significant diagnostics from this hospitalization (including imaging, microbiology, ancillary and laboratory) are listed below  for reference.     Microbiology: Recent Results (from the past 240 hours)  Resp panel by RT-PCR (RSV, Flu A&B, Covid) Anterior Nasal Swab     Status: Abnormal   Collection Time: 09/12/23 12:59 PM   Specimen: Anterior Nasal Swab  Result Value Ref Range Status   SARS Coronavirus 2 by RT PCR NEGATIVE NEGATIVE Final   Influenza A by PCR POSITIVE (A) NEGATIVE Final   Influenza B by PCR NEGATIVE NEGATIVE Final    Comment: (NOTE) The Xpert Xpress SARS-CoV-2/FLU/RSV plus assay is intended as an aid in the diagnosis of influenza from Nasopharyngeal swab specimens and should not be used as a sole basis for treatment. Nasal washings and aspirates are unacceptable for Xpert Xpress SARS-CoV-2/FLU/RSV testing.  Fact Sheet for Patients: BloggerCourse.com  Fact Sheet for Healthcare Providers: SeriousBroker.it  This test is not yet approved or cleared by the [place] FDA and has been authorized for detection and/or diagnosis of SARS-CoV-2 by FDA under an Emergency Use Authorization (EUA). This EUA will remain in effect (meaning this test can be used) for the duration of the COVID-19 declaration under Section 564(b)(1) of the Act, 21 U.S.C. section 360bbb-3(b)(1), unless the authorization is terminated or revoked.     Resp Syncytial Virus by PCR NEGATIVE NEGATIVE Final    Comment: (NOTE) Fact Sheet for Patients: BloggerCourse.com  Fact Sheet for Healthcare Providers: SeriousBroker.it  This test is not yet approved or cleared by the [place] FDA and has been authorized for detection and/or diagnosis of SARS-CoV-2 by FDA under an Emergency Use Authorization (EUA). This EUA will remain in effect (meaning this test can be used) for the duration of the COVID-19 declaration under Section 564(b)(1) of the Act, 21 U.S.C. section 360bbb-3(b)(1), unless the authorization is terminated  or revoked.  Performed at [facility], [address], [place], [place] [address]      Labs: BNP (last 3 results) Recent Labs    09/01/23 1806 09/12/23 1328  BNP 2,867.3* 2,799.0*   Basic Metabolic Panel: Recent Labs  Lab 09/12/23 1533 09/12/23 1539 09/13/23 0332 09/14/23 0417 09/14/23 0424 09/15/23 1202 09/16/23 0353  NA 137 138 137 139  --  140 137  K 3.8 3.7 3.8 3.6  --  4.0 3.7  CL 99 102 100 98  --  101 97*  CO2 25  --  27 31  --  33* 33*  GLUCOSE 125* 118* 135* 101*  --  100* 128*  BUN 48* 44* 53* 60*  --  53* 51*  CREATININE 1.71* 1.60* 1.50* 1.53*  --  1.24 1.15  CALCIUM 8.4*  --  8.3* 8.5*  --  9.1 8.8*  MG  --   --  2.2  --  2.2  --   --    Liver Function Tests: Recent Labs  Lab 09/12/23 1533  AST 91*  ALT 61*  ALKPHOS 84  BILITOT 0.7  PROT 5.9*  ALBUMIN 2.6*   No results for input(s): "LIPASE", "AMYLASE" in the last 168 hours. No results for input(s): "AMMONIA" in the last 168 hours. CBC: Recent Labs  Lab 09/12/23 1323 09/12/23 1539 09/13/23 0332 09/14/23 0424 09/15/23 0524 09/16/23 0353  WBC 10.1  --  10.1 13.6* 10.3 9.5  NEUTROABS 8.5*  --   --   --   --   --   HGB 10.6* 10.9* 10.4* 9.2* 9.4* 8.9*  HCT 32.8* 32.0* 33.6* 29.7* 30.6* 28.7*  MCV 88.9  --  90.8 90.0 91.3 90.3  PLT 208  --  165 158 152 135*   Cardiac Enzymes:  No results for input(s): "CKTOTAL", "CKMB", "CKMBINDEX", "TROPONINI" in the last 168 hours. BNP: Invalid input(s): "POCBNP" CBG: Recent Labs  Lab 09/15/23 0724 09/15/23 1225 09/15/23 1628 09/15/23 2050 09/16/23 0723  GLUCAP 93 116* 110* 324* 101*   D-Dimer No results for input(s): "DDIMER" in the last 72 hours. Hgb A1c No results for input(s): "HGBA1C" in the last 72 hours. Lipid Profile No results for input(s): "CHOL", "HDL", "LDLCALC", "TRIG", "CHOLHDL", "LDLDIRECT" in the last 72 hours. Thyroid function studies No results for input(s): "TSH", "T4TOTAL", "T3FREE", "THYROIDAB" in the last 72  hours.  Invalid input(s): "FREET3" Anemia work up No results for input(s): "VITAMINB12", "FOLATE", "FERRITIN", "TIBC", "IRON", "RETICCTPCT" in the last 72 hours. Urinalysis    Component Value Date/Time   COLORURINE YELLOW 10/05/2009 1342   APPEARANCEUR CLOUDY (A) 10/05/2009 1342   LABSPEC 1.010 10/05/2009 1342   PHURINE 8.5 (H) 10/05/2009 1342   GLUCOSEU NEGATIVE 10/05/2009 1342   HGBUR NEGATIVE 10/05/2009 1342   BILIRUBINUR NEGATIVE 10/05/2009 1342   KETONESUR NEGATIVE 10/05/2009 1342   PROTEINUR NEGATIVE 10/05/2009 1342   UROBILINOGEN 1.0 10/05/2009 1342   NITRITE NEGATIVE 10/05/2009 1342   LEUKOCYTESUR  10/05/2009 1342    NEGATIVE MICROSCOPIC NOT DONE ON URINES WITH NEGATIVE PROTEIN, BLOOD, LEUKOCYTES, NITRITE, OR GLUCOSE <1000 mg/dL.   Sepsis Labs Recent Labs  Lab 09/13/23 0332 09/14/23 0424 09/15/23 0524 09/16/23 0353  WBC 10.1 13.6* 10.3 9.5   Microbiology Recent Results (from the past 240 hours)  Resp panel by RT-PCR (RSV, Flu A&B, Covid) Anterior Nasal Swab     Status: Abnormal   Collection Time: 09/12/23 12:59 PM   Specimen: Anterior Nasal Swab  Result Value Ref Range Status   SARS Coronavirus 2 by RT PCR NEGATIVE NEGATIVE Final   Influenza A by PCR POSITIVE (A) NEGATIVE Final   Influenza B by PCR NEGATIVE NEGATIVE Final    Comment: (NOTE) The Xpert Xpress SARS-CoV-2/FLU/RSV plus assay is intended as an aid in the diagnosis of influenza from Nasopharyngeal swab specimens and should not be used as a sole basis for treatment. Nasal washings and aspirates are unacceptable for Xpert Xpress SARS-CoV-2/FLU/RSV testing.  Fact Sheet for Patients: BloggerCourse.com  Fact Sheet for Healthcare Providers: SeriousBroker.it  This test is not yet approved or cleared by the [place] FDA and has been authorized for detection and/or diagnosis of SARS-CoV-2 by FDA under an Emergency Use Authorization (EUA). This EUA  will remain in effect (meaning this test can be used) for the duration of the COVID-19 declaration under Section 564(b)(1) of the Act, 21 U.S.C. section 360bbb-3(b)(1), unless the authorization is terminated or revoked.     Resp Syncytial Virus by PCR NEGATIVE NEGATIVE Final    Comment: (NOTE) Fact Sheet for Patients: BloggerCourse.com  Fact Sheet for Healthcare Providers: SeriousBroker.it  This test is not yet approved or cleared by the [place] FDA and has been authorized for detection and/or diagnosis of SARS-CoV-2 by FDA under an Emergency Use Authorization (EUA). This EUA will remain in effect (meaning this test can be used) for the duration of the COVID-19 declaration under Section 564(b)(1) of the Act, 21 U.S.C. section 360bbb-3(b)(1), unless the authorization is terminated or revoked.  Performed at [facility], [address], [place], [place] [address]      Time coordinating discharge: 35 minutes  SIGNED:   Dorcas Carrow, MD  [facility] 09/16/2023, 8:20 AM

## 2023-09-16 NOTE — TOC Transition Note (Signed)
Transition of Care [facility]) - Discharge Note   Patient Details  Name: Richard Rivers MRN: [id number] Date of Birth: Jan 18, 1946  [facility] CM/SW Contact:  Gala Lewandowsky, RN Phone Number: 09/16/2023, 9:28 AM   Clinical Narrative: Oxygen has been delivered from [facility] in [place]. Patient will transition home via private vehicle. No further needs identified at this time.      Final next level of care: Home w Home Health Services Barriers to Discharge: No Barriers Identified   Patient Goals and CMS Choice Patient states their goals for this hospitalization and ongoing recovery are:: plan for home with home health   Choice offered to / list presented to : Patient, NA (Patient has no preference; however wants [facility])  Discharge Plan and Services Additional resources added to the After Visit Summary for     Discharge Planning Services: CM Consult ([facility] out [facility].) Post Acute Care Choice: Home Health          DME Arranged: Oxygen DME Agency: [facility], [place] Date DME Agency Contacted: 09/15/23 Time DME Agency Contacted: 0930 Representative spoke with at DME Agency: [facility] HH Arranged: PT HH Agency: [facility] Date [facility] Agency Contacted: 09/14/23 Time HH Agency Contacted: 1552 Representative spoke with at [facility] Agency: Kandee Keen  Social Drivers of Health (SDOH) Interventions SDOH Screenings   Food Insecurity: No Food Insecurity (09/12/2023)  Housing: Low Risk  (09/13/2023)  Transportation Needs: No Transportation Needs (09/13/2023)  Utilities: Not At Risk (09/12/2023)  Alcohol Screen: Low Risk  (09/13/2023)  Financial Resource Strain: Low Risk  (09/13/2023)  Social Connections: Socially Isolated (09/12/2023)  Tobacco Use: High Risk (09/12/2023)   Readmission Risk Interventions     No data to display

## 2023-09-24 ENCOUNTER — Encounter (HOSPITAL_COMMUNITY): Payer: Self-pay | Admitting: Emergency Medicine

## 2023-09-24 ENCOUNTER — Other Ambulatory Visit: Payer: Self-pay

## 2023-09-24 ENCOUNTER — Emergency Department (HOSPITAL_COMMUNITY)
Admission: EM | Admit: 2023-09-24 | Discharge: 2023-09-24 | Disposition: A | Discharge status: Home / Self Care | Payer: No Typology Code available for payment source | Attending: Emergency Medicine | Admitting: Emergency Medicine

## 2023-09-24 DIAGNOSIS — Z7901 Long term (current) use of anticoagulants: Secondary | ICD-10-CM | POA: Insufficient documentation

## 2023-09-24 DIAGNOSIS — R040 Epistaxis: Secondary | ICD-10-CM | POA: Insufficient documentation

## 2023-09-24 MED ORDER — OXYMETAZOLINE HCL 0.05 % NA SOLN
1.0000 | Freq: Once | NASAL | Status: AC
  Administered 2023-09-24: 1 via NASAL
  Filled 2023-09-24: qty 30

## 2023-09-24 NOTE — ED Triage Notes (Signed)
Pt reports nose bleed that started yesterday. Pt reports he is taking a blood thinner. Denies any dizziness or weakness.

## 2023-09-24 NOTE — ED Provider Notes (Addendum)
[place] [facility] DEPART[facility] AT [facility] Provider Note   CSN: [id number] Arrival date & time: 09/24/23  1545     History  Chief Complaint  Patient presents with   Epistaxis    Richard Rivers is a 78 y.o. male on Plavix, presenting with concern for a nosebleed from right nostril has been ongoing for the past day.  Denies any dizziness or weakness.  Has been having difficulty controlling the bleeding with this pressure alone at home.   Epistaxis      Home Medications Prior to Admission medications   Medication Sig Start Date End Date Taking? Authorizing Provider  acetaminophen (TYLENOL) 325 MG tablet Take 650 mg by mouth 3 (three) times daily as needed.    [provider]  allopurinol (ZYLOPRIM) 300 MG tablet Take 300 mg by mouth daily.    [provider]  ammonium lactate (LAC-HYDRIN) 12 % lotion Apply 1 Application topically 2 (two) times daily as needed. APPLY TO DRY SKIN ON FEET 04/14/23   [provider]  atorvastatin (LIPITOR) 80 MG tablet Take 80 mg by mouth at bedtime. 04/14/23   [provider]  busPIRone (BUSPAR) 10 MG tablet Take 10 mg by mouth 2 (two) times daily as needed.    [provider]  carvedilol (COREG) 12.5 MG tablet Take 12.5 mg by mouth 2 (two) times daily with a meal.    [provider]  clopidogrel (PLAVIX) 75 MG tablet Take 1 tablet (75 mg total) by mouth daily. 09/16/23   Dorcas Carrow, MD  colchicine 0.6 MG tablet Take 0.6 mg by mouth daily. 10/24/22   [provider]  docusate sodium (COLACE) 50 MG capsule Take 50 mg by mouth daily as needed for mild constipation. Per son, patient has this medication but doesn't use often    [provider]  famotidine (PEPCID) 20 MG tablet Take 20 mg by mouth daily. 04/14/23   [provider]  furosemide (LASIX) 20 MG tablet Take 2 tablets (40 mg total) by mouth daily. Do not dispense earlier prescription of 20 mg daily.  09/16/23 09/15/24  Dorcas Carrow, MD  gabapentin (NEURONTIN) 300 MG capsule Take 600 mg by mouth 3 (three) times daily.    [provider]  guaiFENesin (MUCINEX) 600 MG 12 hr tablet Take 600 mg by mouth every 12 (twelve) hours as needed for cough. 04/14/23   [provider]  hydrOXYzine (ATARAX) 25 MG tablet Take 25 mg by mouth 2 (two) times daily as needed for anxiety. 12/28/22 10/17/23  [provider]  Ipratropium-Albuterol (COMBIVENT RESPIMAT) 20-100 MCG/ACT AERS respimat Inhale 1 puff into the lungs 4 (four) times daily.    [provider]  isosorbide mononitrate (IMDUR) 30 MG 24 hr tablet Take 1 tablet (30 mg total) by mouth daily. 09/16/23   Dorcas Carrow, MD  nitroGLYCERIN (NITROSTAT) 0.4 MG SL tablet Place 1 tablet (0.4 mg total) under the tongue every 5 (five) minutes x 3 doses as needed for chest pain. 09/15/23   Dorcas Carrow, MD  rivaroxaban (XARELTO) 2.5 MG TABS tablet Take 1 tablet (2.5 mg total) by mouth 2 (two) times daily. 09/15/23   Dorcas Carrow, MD  sacubitril-valsartan (ENTRESTO) 24-26 MG Take 1 tablet by mouth 2 (two) times daily. 09/15/23   Dorcas Carrow, MD  traZODone (DESYREL) 50 MG tablet Take 1-3 tablets by mouth at bedtime. Take 2 tablets at night    [provider]      Allergies  Codeine and Penicillin g    Review of Systems   Review of Systems  HENT:  Positive for nosebleeds.     Physical Exam Updated Vital Signs BP (!) 153/82 (BP Location: Right Arm)   Pulse 60   Temp 98.1 F (36.7 C) (Oral)   Resp 18   SpO2 91%  Physical Exam Vitals and nursing note reviewed.  Constitutional:      Appearance: Normal appearance.  HENT:     Head: Atraumatic.     Nose:     Comments: Bleeding from the right anterior septum, not well controlled upon arrival  Upon administration of Afrin, bleeding stopped Cardiovascular:     Rate and Rhythm: Normal rate and regular rhythm.  Pulmonary:     Effort: Pulmonary effort is  normal.  Neurological:     General: No focal deficit present.     Mental Status: He is alert.  Psychiatric:        Mood and Affect: Mood normal.        Behavior: Behavior normal.     ED Results / Procedures / Treatments   Labs (all labs ordered are listed, but only abnormal results are displayed) Labs Reviewed - No data to display  EKG None  Radiology No results found.  Procedures Procedures    Medications Ordered in ED Medications  oxymetazoline (AFRIN) 0.05 % nasal spray 1 spray (1 spray Right Nare Given 09/24/23 1657)    ED Course/ Medical Decision Making/ A&P                                 Medical Decision Making Risk OTC drugs.     Differential diagnosis includes but is not limited to anterior epistaxis, posterior epistaxis, nasal trauma  ED Course:  Patient well-appearing, stable vital signs.  Was having bleeding from the right nostril upon arrival to the ER.  He was given Afrin nasal spray and told to hold pressure on the nostril. Upon recheck patient still had a slight bleed from the septum of the right nostril.  Administered another puff of Afrin.  Reevaluated patient about 20 minutes after second administration of Afrin.  The bleeding stopped.  No concern for posterior bleed that would require further packing.  Patient is stable and appropriate for discharge home this time.   Impression: Right-sided epistaxis  Disposition:  The patient was discharged home with instructions to apply direct uninterrupted pressure to the nostril for 20 minutes if bleeding recurs.  If this does not help with the bleeding, may use another puff of Afrin.  He was instructed to not take this medication for longer than 3 days at a time.  Instructed to use a humidifier at home and to refrain from irritating his nostril such as blowing his nose or rubbing his nose.  Follow-up with PCP if he has recurrent nosebleeds. Return precautions given.               Final  Clinical Impression(s) / ED Diagnoses Final diagnoses:  Right-sided epistaxis    Rx / DC Orders ED Discharge Orders     None         Arabella Merles, PA-C 09/24/23 2133    Arabella Merles, PA-C 09/24/23 2134    Vanetta Mulders, MD 09/24/23 2358

## 2023-09-24 NOTE — Discharge Instructions (Addendum)
Nosebleeds commonly recur.   Apply direct, uninterrupted pressure over the soft part of your nose for 20 minutes. If your bleeding has not stopped, you may use 1 puff of Afrin spray (the nasal spray given to you here) in the affected nostril. If your bleeding continues, please go to the ER and keep applying pressure to your nose. Never use afrin for more than 3 days.   If you find yourself needing more of this medication in the future, you can get this over-the-counter.  The generic is called oxymetazoline, the brand name is Afrin.  Use a humidifier at home to prevent your nose from drying or stand in a hot shower with the steam.  Refrain from irritating your nose such as rubbing or blowing your nose.  Please follow-up with your PCP if you continue to have recurrent nosebleeds.  Return also immediately to the nearest ED if you develop chest pain, shortness of breath, dizziness or fainting, severe bleeding, or other concerns.

## 2023-09-24 NOTE — ED Provider Triage Note (Signed)
Emergency Medicine Provider Triage Evaluation Note  Richard Rivers , a 78 y.o. male  was evaluated in triage.  Pt complains of nosebleed from right nostril has been ongoing for the past day.  States he takes a blood thinner, but unsure which medication.  Nuys any dizziness or weakness.  Review of Systems  Positive: As above Negative: As above  Physical Exam  BP 131/83   Pulse 99   Temp 97.8 F (36.6 C)   Resp 16   SpO2 (!) 88%  Gen:   Awake, no distress   Resp:  Normal effort  MSK:   Moves extremities without difficulty  Other:  Right nare stuffed with blood stained gauze  Medical Decision Making  Medically screening exam initiated at 4:11 PM.  Appropriate orders placed.  [name] was informed that the remainder of the evaluation will be completed by another provider, this initial triage assessment does not replace that evaluation, and the importance of remaining in the ED until their evaluation is complete.     Arabella Merles, PA-C 09/24/23 1612

## 2023-10-16 ENCOUNTER — Inpatient Hospital Stay (HOSPITAL_COMMUNITY)
Admission: EM | Admit: 2023-10-16 | Discharge: 2023-10-22 | DRG: 280 | Disposition: A | Discharge status: Home Health | Payer: No Typology Code available for payment source | Attending: Internal Medicine | Admitting: Internal Medicine

## 2023-10-16 ENCOUNTER — Emergency Department (HOSPITAL_COMMUNITY): Payer: No Typology Code available for payment source

## 2023-10-16 ENCOUNTER — Other Ambulatory Visit: Payer: Self-pay

## 2023-10-16 ENCOUNTER — Encounter (HOSPITAL_COMMUNITY): Payer: Self-pay

## 2023-10-16 DIAGNOSIS — I130 Hypertensive heart and chronic kidney disease with heart failure and stage 1 through stage 4 chronic kidney disease, or unspecified chronic kidney disease: Secondary | ICD-10-CM | POA: Diagnosis present

## 2023-10-16 DIAGNOSIS — J111 Influenza due to unidentified influenza virus with other respiratory manifestations: Secondary | ICD-10-CM

## 2023-10-16 DIAGNOSIS — I739 Peripheral vascular disease, unspecified: Secondary | ICD-10-CM | POA: Diagnosis present

## 2023-10-16 DIAGNOSIS — N179 Acute kidney failure, unspecified: Secondary | ICD-10-CM | POA: Diagnosis not present

## 2023-10-16 DIAGNOSIS — I472 Ventricular tachycardia, unspecified: Secondary | ICD-10-CM | POA: Diagnosis present

## 2023-10-16 DIAGNOSIS — Z7902 Long term (current) use of antithrombotics/antiplatelets: Secondary | ICD-10-CM

## 2023-10-16 DIAGNOSIS — E785 Hyperlipidemia, unspecified: Secondary | ICD-10-CM | POA: Diagnosis present

## 2023-10-16 DIAGNOSIS — E43 Unspecified severe protein-calorie malnutrition: Secondary | ICD-10-CM | POA: Diagnosis present

## 2023-10-16 DIAGNOSIS — I5023 Acute on chronic systolic (congestive) heart failure: Secondary | ICD-10-CM | POA: Diagnosis not present

## 2023-10-16 DIAGNOSIS — I214 Non-ST elevation (NSTEMI) myocardial infarction: Secondary | ICD-10-CM | POA: Diagnosis present

## 2023-10-16 DIAGNOSIS — J9601 Acute respiratory failure with hypoxia: Secondary | ICD-10-CM | POA: Diagnosis present

## 2023-10-16 DIAGNOSIS — E872 Acidosis, unspecified: Secondary | ICD-10-CM | POA: Diagnosis present

## 2023-10-16 DIAGNOSIS — M19041 Primary osteoarthritis, right hand: Secondary | ICD-10-CM | POA: Diagnosis present

## 2023-10-16 DIAGNOSIS — I255 Ischemic cardiomyopathy: Secondary | ICD-10-CM | POA: Diagnosis present

## 2023-10-16 DIAGNOSIS — Z7982 Long term (current) use of aspirin: Secondary | ICD-10-CM

## 2023-10-16 DIAGNOSIS — Z801 Family history of malignant neoplasm of trachea, bronchus and lung: Secondary | ICD-10-CM

## 2023-10-16 DIAGNOSIS — F17210 Nicotine dependence, cigarettes, uncomplicated: Secondary | ICD-10-CM | POA: Diagnosis present

## 2023-10-16 DIAGNOSIS — F32A Depression, unspecified: Secondary | ICD-10-CM | POA: Diagnosis present

## 2023-10-16 DIAGNOSIS — I272 Pulmonary hypertension, unspecified: Secondary | ICD-10-CM | POA: Diagnosis present

## 2023-10-16 DIAGNOSIS — A419 Sepsis, unspecified organism: Secondary | ICD-10-CM

## 2023-10-16 DIAGNOSIS — Z7951 Long term (current) use of inhaled steroids: Secondary | ICD-10-CM

## 2023-10-16 DIAGNOSIS — J440 Chronic obstructive pulmonary disease with acute lower respiratory infection: Secondary | ICD-10-CM | POA: Diagnosis present

## 2023-10-16 DIAGNOSIS — D649 Anemia, unspecified: Secondary | ICD-10-CM | POA: Diagnosis present

## 2023-10-16 DIAGNOSIS — I4892 Unspecified atrial flutter: Secondary | ICD-10-CM | POA: Diagnosis present

## 2023-10-16 DIAGNOSIS — Z860100 Personal history of colon polyps, unspecified: Secondary | ICD-10-CM

## 2023-10-16 DIAGNOSIS — T82855A Stenosis of coronary artery stent, initial encounter: Secondary | ICD-10-CM | POA: Diagnosis present

## 2023-10-16 DIAGNOSIS — R54 Age-related physical debility: Secondary | ICD-10-CM | POA: Diagnosis present

## 2023-10-16 DIAGNOSIS — I483 Typical atrial flutter: Principal | ICD-10-CM | POA: Diagnosis present

## 2023-10-16 DIAGNOSIS — F1021 Alcohol dependence, in remission: Secondary | ICD-10-CM | POA: Diagnosis present

## 2023-10-16 DIAGNOSIS — Z1152 Encounter for screening for COVID-19: Secondary | ICD-10-CM | POA: Diagnosis not present

## 2023-10-16 DIAGNOSIS — Z7901 Long term (current) use of anticoagulants: Secondary | ICD-10-CM

## 2023-10-16 DIAGNOSIS — Z885 Allergy status to narcotic agent status: Secondary | ICD-10-CM

## 2023-10-16 DIAGNOSIS — I5020 Unspecified systolic (congestive) heart failure: Secondary | ICD-10-CM | POA: Diagnosis not present

## 2023-10-16 DIAGNOSIS — J449 Chronic obstructive pulmonary disease, unspecified: Secondary | ICD-10-CM | POA: Diagnosis present

## 2023-10-16 DIAGNOSIS — F418 Other specified anxiety disorders: Secondary | ICD-10-CM | POA: Diagnosis present

## 2023-10-16 DIAGNOSIS — Y831 Surgical operation with implant of artificial internal device as the cause of abnormal reaction of the patient, or of later complication, without mention of misadventure at the time of the procedure: Secondary | ICD-10-CM | POA: Diagnosis present

## 2023-10-16 DIAGNOSIS — J1000 Influenza due to other identified influenza virus with unspecified type of pneumonia: Secondary | ICD-10-CM | POA: Diagnosis present

## 2023-10-16 DIAGNOSIS — Z8041 Family history of malignant neoplasm of ovary: Secondary | ICD-10-CM

## 2023-10-16 DIAGNOSIS — R918 Other nonspecific abnormal finding of lung field: Secondary | ICD-10-CM | POA: Diagnosis present

## 2023-10-16 DIAGNOSIS — R9431 Abnormal electrocardiogram [ECG] [EKG]: Secondary | ICD-10-CM | POA: Diagnosis present

## 2023-10-16 DIAGNOSIS — I252 Old myocardial infarction: Secondary | ICD-10-CM

## 2023-10-16 DIAGNOSIS — I447 Left bundle-branch block, unspecified: Secondary | ICD-10-CM | POA: Diagnosis present

## 2023-10-16 DIAGNOSIS — Z95828 Presence of other vascular implants and grafts: Secondary | ICD-10-CM

## 2023-10-16 DIAGNOSIS — J189 Pneumonia, unspecified organism: Secondary | ICD-10-CM | POA: Diagnosis not present

## 2023-10-16 DIAGNOSIS — I4819 Other persistent atrial fibrillation: Secondary | ICD-10-CM | POA: Diagnosis present

## 2023-10-16 DIAGNOSIS — I2510 Atherosclerotic heart disease of native coronary artery without angina pectoris: Secondary | ICD-10-CM | POA: Diagnosis not present

## 2023-10-16 DIAGNOSIS — Y712 Prosthetic and other implants, materials and accessory cardiovascular devices associated with adverse incidents: Secondary | ICD-10-CM | POA: Diagnosis present

## 2023-10-16 DIAGNOSIS — I5021 Acute systolic (congestive) heart failure: Secondary | ICD-10-CM | POA: Diagnosis not present

## 2023-10-16 DIAGNOSIS — M109 Gout, unspecified: Secondary | ICD-10-CM | POA: Diagnosis present

## 2023-10-16 DIAGNOSIS — Z7984 Long term (current) use of oral hypoglycemic drugs: Secondary | ICD-10-CM

## 2023-10-16 DIAGNOSIS — I4891 Unspecified atrial fibrillation: Secondary | ICD-10-CM | POA: Diagnosis not present

## 2023-10-16 DIAGNOSIS — Z880 Allergy status to penicillin: Secondary | ICD-10-CM

## 2023-10-16 DIAGNOSIS — F419 Anxiety disorder, unspecified: Secondary | ICD-10-CM | POA: Diagnosis present

## 2023-10-16 DIAGNOSIS — Z716 Tobacco abuse counseling: Secondary | ICD-10-CM

## 2023-10-16 DIAGNOSIS — E871 Hypo-osmolality and hyponatremia: Secondary | ICD-10-CM | POA: Diagnosis present

## 2023-10-16 DIAGNOSIS — Z79899 Other long term (current) drug therapy: Secondary | ICD-10-CM

## 2023-10-16 DIAGNOSIS — N1831 Chronic kidney disease, stage 3a: Secondary | ICD-10-CM | POA: Diagnosis present

## 2023-10-16 DIAGNOSIS — E875 Hyperkalemia: Secondary | ICD-10-CM | POA: Diagnosis present

## 2023-10-16 DIAGNOSIS — R7303 Prediabetes: Secondary | ICD-10-CM | POA: Diagnosis present

## 2023-10-16 DIAGNOSIS — I5043 Acute on chronic combined systolic (congestive) and diastolic (congestive) heart failure: Secondary | ICD-10-CM | POA: Diagnosis present

## 2023-10-16 DIAGNOSIS — R0603 Acute respiratory distress: Secondary | ICD-10-CM

## 2023-10-16 DIAGNOSIS — Z6820 Body mass index (BMI) 20.0-20.9, adult: Secondary | ICD-10-CM

## 2023-10-16 DIAGNOSIS — Z604 Social exclusion and rejection: Secondary | ICD-10-CM | POA: Diagnosis present

## 2023-10-16 DIAGNOSIS — F1290 Cannabis use, unspecified, uncomplicated: Secondary | ICD-10-CM | POA: Diagnosis present

## 2023-10-16 DIAGNOSIS — J188 Other pneumonia, unspecified organism: Secondary | ICD-10-CM | POA: Diagnosis present

## 2023-10-16 DIAGNOSIS — G629 Polyneuropathy, unspecified: Secondary | ICD-10-CM

## 2023-10-16 DIAGNOSIS — M19042 Primary osteoarthritis, left hand: Secondary | ICD-10-CM | POA: Diagnosis present

## 2023-10-16 DIAGNOSIS — Z9861 Coronary angioplasty status: Secondary | ICD-10-CM

## 2023-10-16 DIAGNOSIS — I10 Essential (primary) hypertension: Secondary | ICD-10-CM | POA: Diagnosis present

## 2023-10-16 LAB — RESP PANEL BY RT-PCR (RSV, FLU A&B, COVID)  RVPGX2
Influenza A by PCR: POSITIVE — AB
Influenza B by PCR: NEGATIVE
Resp Syncytial Virus by PCR: NEGATIVE
SARS Coronavirus 2 by RT PCR: NEGATIVE

## 2023-10-16 LAB — I-STAT CHEM 8, ED
BUN: 24 mg/dL — ABNORMAL HIGH (ref 8–23)
BUN: 24 mg/dL — ABNORMAL HIGH (ref 8–23)
Calcium, Ion: 1.27 mmol/L (ref 1.15–1.40)
Calcium, Ion: 1.28 mmol/L (ref 1.15–1.40)
Chloride: 104 mmol/L (ref 98–111)
Chloride: 103 mmol/L (ref 98–111)
Creatinine, Ser: 1.1 mg/dL (ref 0.61–1.24)
Creatinine, Ser: 1.2 mg/dL (ref 0.61–1.24)
Glucose, Bld: 317 mg/dL — ABNORMAL HIGH (ref 70–99)
Glucose, Bld: 272 mg/dL — ABNORMAL HIGH (ref 70–99)
HCT: 37 % — ABNORMAL LOW (ref 39.0–52.0)
HCT: 34 % — ABNORMAL LOW (ref 39.0–52.0)
Hemoglobin: 12.6 g/dL — ABNORMAL LOW (ref 13.0–17.0)
Hemoglobin: 11.6 g/dL — ABNORMAL LOW (ref 13.0–17.0)
Potassium: 5.9 mmol/L — ABNORMAL HIGH (ref 3.5–5.1)
Potassium: 6.1 mmol/L — ABNORMAL HIGH (ref 3.5–5.1)
Sodium: 133 mmol/L — ABNORMAL LOW (ref 135–145)
Sodium: 133 mmol/L — ABNORMAL LOW (ref 135–145)
TCO2: 26 mmol/L (ref 22–32)
TCO2: 26 mmol/L (ref 22–32)

## 2023-10-16 LAB — COMPREHENSIVE METABOLIC PANEL
ALT: 11 U/L (ref 0–44)
AST: 19 U/L (ref 15–41)
Albumin: 3.1 g/dL — ABNORMAL LOW (ref 3.5–5.0)
Alkaline Phosphatase: 103 U/L (ref 38–126)
Anion gap: 11 (ref 5–15)
BUN: 19 mg/dL (ref 8–23)
CO2: 21 mmol/L — ABNORMAL LOW (ref 22–32)
Calcium: 10.1 mg/dL (ref 8.9–10.3)
Chloride: 101 mmol/L (ref 98–111)
Creatinine, Ser: 1.21 mg/dL (ref 0.61–1.24)
GFR, Estimated: >60 mL/min (ref >60)
Glucose, Bld: 317 mg/dL — ABNORMAL HIGH (ref 70–99)
Potassium: 5.6 mmol/L — ABNORMAL HIGH (ref 3.5–5.1)
Sodium: 133 mmol/L — ABNORMAL LOW (ref 135–145)
Total Bilirubin: 0.4 mg/dL (ref 0.0–1.2)
Total Protein: 7.6 g/dL (ref 6.5–8.1)

## 2023-10-16 LAB — BASIC METABOLIC PANEL
Anion gap: 9 (ref 5–15)
BUN: 22 mg/dL (ref 8–23)
CO2: 27 mmol/L (ref 22–32)
Calcium: 9.8 mg/dL (ref 8.9–10.3)
Chloride: 99 mmol/L (ref 98–111)
Creatinine, Ser: 1.24 mg/dL (ref 0.61–1.24)
GFR, Estimated: 60 mL/min — ABNORMAL LOW (ref >60)
Glucose, Bld: 115 mg/dL — ABNORMAL HIGH (ref 70–99)
Potassium: 5.7 mmol/L — ABNORMAL HIGH (ref 3.5–5.1)
Sodium: 135 mmol/L (ref 135–145)

## 2023-10-16 LAB — CBC WITH DIFFERENTIAL/PLATELET
Abs Immature Granulocytes: 0.21 10*3/uL — ABNORMAL HIGH (ref 0.00–0.07)
Basophils Absolute: 0.1 10*3/uL (ref 0.0–0.1)
Basophils Relative: 1 %
Eosinophils Absolute: 0.2 10*3/uL (ref 0.0–0.5)
Eosinophils Relative: 1 %
HCT: 35.4 % — ABNORMAL LOW (ref 39.0–52.0)
Hemoglobin: 10.7 g/dL — ABNORMAL LOW (ref 13.0–17.0)
Immature Granulocytes: 1 %
Lymphocytes Relative: 22 %
Lymphs Abs: 3.5 10*3/uL (ref 0.7–4.0)
MCH: 28.2 pg (ref 26.0–34.0)
MCHC: 30.2 g/dL (ref 30.0–36.0)
MCV: 93.2 fL (ref 80.0–100.0)
Monocytes Absolute: 1 10*3/uL (ref 0.1–1.0)
Monocytes Relative: 6 %
Neutro Abs: 10.7 10*3/uL — ABNORMAL HIGH (ref 1.7–7.7)
Neutrophils Relative %: 69 %
Platelets: 393 10*3/uL (ref 150–400)
RBC: 3.8 MIL/uL — ABNORMAL LOW (ref 4.22–5.81)
RDW: 16.7 % — ABNORMAL HIGH (ref 11.5–15.5)
WBC: 15.6 10*3/uL — ABNORMAL HIGH (ref 4.0–10.5)
nRBC: 0 % (ref 0.0–0.2)

## 2023-10-16 LAB — GLUCOSE, CAPILLARY
Glucose-Capillary: 99 mg/dL (ref 70–99)
Glucose-Capillary: 94 mg/dL (ref 70–99)

## 2023-10-16 LAB — APTT
aPTT: 37 s — ABNORMAL HIGH (ref 24–36)
aPTT: 51 s — ABNORMAL HIGH (ref 24–36)

## 2023-10-16 LAB — TROPONIN I (HIGH SENSITIVITY)
Troponin I (High Sensitivity): 78 ng/L — ABNORMAL HIGH (ref <18)
Troponin I (High Sensitivity): 314 ng/L (ref <18)
Troponin I (High Sensitivity): 12243 ng/L (ref <18)
Troponin I (High Sensitivity): 20977 ng/L (ref <18)

## 2023-10-16 LAB — LACTIC ACID, PLASMA
Lactic Acid, Venous: 2 mmol/L (ref 0.5–1.9)
Lactic Acid, Venous: 0.9 mmol/L (ref 0.5–1.9)

## 2023-10-16 LAB — MAGNESIUM: Magnesium: 1.7 mg/dL (ref 1.7–2.4)

## 2023-10-16 LAB — BRAIN NATRIURETIC PEPTIDE: B Natriuretic Peptide: 910.2 pg/mL — ABNORMAL HIGH (ref 0.0–100.0)

## 2023-10-16 LAB — CBG MONITORING, ED: Glucose-Capillary: 107 mg/dL — ABNORMAL HIGH (ref 70–99)

## 2023-10-16 LAB — PROTIME-INR
INR: 2.3 — ABNORMAL HIGH (ref 0.8–1.2)
Prothrombin Time: 25.5 s — ABNORMAL HIGH (ref 11.4–15.2)

## 2023-10-16 MED ORDER — AMIODARONE HCL 200 MG PO TABS: 200.0000 mg | ORAL_TABLET | Freq: Every day | ORAL | Status: DC

## 2023-10-16 MED ORDER — ALLOPURINOL 300 MG PO TABS
300.0000 mg | ORAL_TABLET | Freq: Every day | ORAL | Status: DC
  Administered 2023-10-16 – 2023-10-22 (×7): 300 mg via ORAL
  Filled 2023-10-16 (×2): qty 1
  Filled 2023-10-16: qty 3
  Filled 2023-10-16 (×6): qty 1

## 2023-10-16 MED ORDER — FAMOTIDINE 20 MG PO TABS
20.0000 mg | ORAL_TABLET | Freq: Every day | ORAL | Status: DC
  Administered 2023-10-17 – 2023-10-22 (×6): 20 mg via ORAL
  Filled 2023-10-16 (×6): qty 1

## 2023-10-16 MED ORDER — CARVEDILOL 12.5 MG PO TABS
12.5000 mg | ORAL_TABLET | Freq: Two times a day (BID) | ORAL | Status: DC
  Administered 2023-10-16 – 2023-10-22 (×12): 12.5 mg via ORAL
  Filled 2023-10-16 (×12): qty 1

## 2023-10-16 MED ORDER — ALBUTEROL SULFATE (2.5 MG/3ML) 0.083% IN NEBU: 2.5000 mg | INHALATION_SOLUTION | RESPIRATORY_TRACT | Status: DC | PRN

## 2023-10-16 MED ORDER — SODIUM BICARBONATE 8.4 % IV SOLN
50.0000 meq | Freq: Once | INTRAVENOUS | Status: AC
  Administered 2023-10-16: 50 meq via INTRAVENOUS
  Filled 2023-10-16: qty 50

## 2023-10-16 MED ORDER — IPRATROPIUM-ALBUTEROL 20-100 MCG/ACT IN AERS: 1.0000 | INHALATION_SPRAY | Freq: Four times a day (QID) | RESPIRATORY_TRACT | Status: DC

## 2023-10-16 MED ORDER — HYDROXYZINE HCL 25 MG PO TABS: 25.0000 mg | ORAL_TABLET | Freq: Two times a day (BID) | ORAL | Status: DC | PRN

## 2023-10-16 MED ORDER — SODIUM ZIRCONIUM CYCLOSILICATE 5 G PO PACK
5.0000 g | PACK | Freq: Once | ORAL | Status: DC
  Filled 2023-10-16 (×2): qty 1

## 2023-10-16 MED ORDER — OXYCODONE HCL 5 MG PO TABS
5.0000 mg | ORAL_TABLET | ORAL | Status: DC | PRN
  Filled 2023-10-16: qty 1

## 2023-10-16 MED ORDER — ONDANSETRON HCL 4 MG PO TABS: 4.0000 mg | ORAL_TABLET | Freq: Four times a day (QID) | ORAL | Status: DC | PRN

## 2023-10-16 MED ORDER — NITROGLYCERIN 0.4 MG SL SUBL
0.4000 mg | SUBLINGUAL_TABLET | SUBLINGUAL | Status: DC | PRN
  Administered 2023-10-18: 0.4 mg via SUBLINGUAL
  Filled 2023-10-16: qty 1

## 2023-10-16 MED ORDER — IPRATROPIUM-ALBUTEROL 20-100 MCG/ACT IN AERS
1.0000 | INHALATION_SPRAY | Freq: Four times a day (QID) | RESPIRATORY_TRACT | Status: DC
  Administered 2023-10-16 – 2023-10-22 (×22): 1 via RESPIRATORY_TRACT
  Filled 2023-10-16: qty 4

## 2023-10-16 MED ORDER — ONDANSETRON HCL 4 MG/2ML IJ SOLN: 4.0000 mg | Freq: Four times a day (QID) | INTRAMUSCULAR | Status: DC | PRN

## 2023-10-16 MED ORDER — SODIUM CHLORIDE 0.9% FLUSH
3.0000 mL | Freq: Two times a day (BID) | INTRAVENOUS | Status: DC
  Administered 2023-10-16 – 2023-10-22 (×10): 3 mL via INTRAVENOUS

## 2023-10-16 MED ORDER — ISOSORBIDE MONONITRATE ER 30 MG PO TB24
30.0000 mg | ORAL_TABLET | Freq: Every day | ORAL | Status: DC
  Administered 2023-10-16 – 2023-10-22 (×7): 30 mg via ORAL
  Filled 2023-10-16 (×7): qty 1

## 2023-10-16 MED ORDER — ALBUTEROL SULFATE (2.5 MG/3ML) 0.083% IN NEBU
10.0000 mg/h | INHALATION_SOLUTION | RESPIRATORY_TRACT | Status: AC
  Administered 2023-10-16: 10 mg/h via RESPIRATORY_TRACT
  Filled 2023-10-16: qty 12

## 2023-10-16 MED ORDER — CITALOPRAM HYDROBROMIDE 20 MG PO TABS
10.0000 mg | ORAL_TABLET | Freq: Every day | ORAL | Status: DC
Start: 2023-10-16 — End: 2023-10-22
  Administered 2023-10-16 – 2023-10-22 (×7): 10 mg via ORAL
  Filled 2023-10-16 (×7): qty 1

## 2023-10-16 MED ORDER — FUROSEMIDE 20 MG PO TABS: 40.0000 mg | ORAL_TABLET | Freq: Every day | ORAL | Status: DC

## 2023-10-16 MED ORDER — ACETAMINOPHEN 325 MG PO TABS: 650.0000 mg | ORAL_TABLET | Freq: Four times a day (QID) | ORAL | Status: DC | PRN

## 2023-10-16 MED ORDER — INSULIN ASPART 100 UNIT/ML IV SOLN
10.0000 [IU] | Freq: Once | INTRAVENOUS | Status: AC
  Administered 2023-10-16: 10 [IU] via INTRAVENOUS

## 2023-10-16 MED ORDER — ACETAMINOPHEN 650 MG RE SUPP: 650.0000 mg | Freq: Four times a day (QID) | RECTAL | Status: DC | PRN

## 2023-10-16 MED ORDER — SODIUM CHLORIDE 0.9 % IV SOLN
2.0000 g | INTRAVENOUS | Status: DC
  Administered 2023-10-17 – 2023-10-22 (×6): 2 g via INTRAVENOUS
  Filled 2023-10-16 (×6): qty 20

## 2023-10-16 MED ORDER — INSULIN ASPART 100 UNIT/ML IJ SOLN
0.0000 [IU] | Freq: Three times a day (TID) | INTRAMUSCULAR | Status: DC
  Administered 2023-10-17 – 2023-10-21 (×5): 1 [IU] via SUBCUTANEOUS

## 2023-10-16 MED ORDER — POLYETHYLENE GLYCOL 3350 17 G PO PACK: 17.0000 g | PACK | Freq: Every day | ORAL | Status: DC | PRN

## 2023-10-16 MED ORDER — COLCHICINE 0.6 MG PO TABS: 0.6000 mg | ORAL_TABLET | Freq: Every day | ORAL | Status: DC

## 2023-10-16 MED ORDER — ATORVASTATIN CALCIUM 80 MG PO TABS
80.0000 mg | ORAL_TABLET | Freq: Every day | ORAL | Status: DC
Start: 2023-10-16 — End: 2023-10-22
  Administered 2023-10-16 – 2023-10-21 (×6): 80 mg via ORAL
  Filled 2023-10-16 (×6): qty 1

## 2023-10-16 MED ORDER — BUSPIRONE HCL 5 MG PO TABS: 10.0000 mg | ORAL_TABLET | Freq: Two times a day (BID) | ORAL | Status: DC | PRN

## 2023-10-16 MED ORDER — DOCUSATE SODIUM 100 MG PO CAPS
100.0000 mg | ORAL_CAPSULE | Freq: Two times a day (BID) | ORAL | Status: DC
  Administered 2023-10-16 – 2023-10-20 (×5): 100 mg via ORAL
  Filled 2023-10-16 (×12): qty 1

## 2023-10-16 MED ORDER — HEPARIN (PORCINE) 25000 UT/250ML-% IV SOLN
1200.0000 [IU]/h | INTRAVENOUS | Status: DC
  Administered 2023-10-16: 800 [IU]/h via INTRAVENOUS
  Administered 2023-10-17 (×2): 1200 [IU]/h via INTRAVENOUS
  Filled 2023-10-16 (×3): qty 250

## 2023-10-16 MED ORDER — CLOPIDOGREL BISULFATE 75 MG PO TABS
75.0000 mg | ORAL_TABLET | Freq: Every day | ORAL | Status: DC
  Administered 2023-10-16 – 2023-10-18 (×3): 75 mg via ORAL
  Filled 2023-10-16 (×3): qty 1

## 2023-10-16 MED ORDER — FENTANYL CITRATE PF 50 MCG/ML IJ SOSY
12.5000 ug | PREFILLED_SYRINGE | Freq: Once | INTRAMUSCULAR | Status: AC
  Administered 2023-10-16: 12.5 ug via INTRAVENOUS
  Filled 2023-10-16: qty 1

## 2023-10-16 MED ORDER — HEPARIN BOLUS VIA INFUSION
2000.0000 [IU] | Freq: Once | INTRAVENOUS | Status: AC
  Administered 2023-10-16: 2000 [IU] via INTRAVENOUS
  Filled 2023-10-16: qty 2000

## 2023-10-16 MED ORDER — GUAIFENESIN ER 600 MG PO TB12: 600.0000 mg | ORAL_TABLET | Freq: Two times a day (BID) | ORAL | Status: DC | PRN

## 2023-10-16 MED ORDER — HYDRALAZINE HCL 20 MG/ML IJ SOLN: 5.0000 mg | INTRAMUSCULAR | Status: DC | PRN

## 2023-10-16 MED ORDER — CALCIUM GLUCONATE 10 % IV SOLN
1.0000 g | Freq: Once | INTRAVENOUS | Status: AC
  Administered 2023-10-16: 1 g via INTRAVENOUS
  Filled 2023-10-16: qty 10

## 2023-10-16 MED ORDER — TRAZODONE HCL 50 MG PO TABS
50.0000 mg | ORAL_TABLET | Freq: Every day | ORAL | Status: DC
  Administered 2023-10-16 – 2023-10-21 (×6): 50 mg via ORAL
  Filled 2023-10-16 (×6): qty 1
  Filled 2023-10-16: qty 3

## 2023-10-16 MED ORDER — SODIUM CHLORIDE 0.9 % IV SOLN
500.0000 mg | INTRAVENOUS | Status: DC
  Administered 2023-10-16 – 2023-10-17 (×2): 500 mg via INTRAVENOUS
  Filled 2023-10-16 (×3): qty 5

## 2023-10-16 MED ORDER — AMIODARONE HCL 200 MG PO TABS
200.0000 mg | ORAL_TABLET | Freq: Two times a day (BID) | ORAL | Status: DC
  Administered 2023-10-16 – 2023-10-22 (×13): 200 mg via ORAL
  Filled 2023-10-16 (×13): qty 1

## 2023-10-16 MED ORDER — GABAPENTIN 300 MG PO CAPS: 600.0000 mg | ORAL_CAPSULE | Freq: Three times a day (TID) | ORAL | Status: DC

## 2023-10-16 MED ORDER — BISACODYL 5 MG PO TBEC: 5.0000 mg | DELAYED_RELEASE_TABLET | Freq: Every day | ORAL | Status: DC | PRN

## 2023-10-16 MED ORDER — IPRATROPIUM-ALBUTEROL 0.5-2.5 (3) MG/3ML IN SOLN
3.0000 mL | Freq: Once | RESPIRATORY_TRACT | Status: AC
  Administered 2023-10-16: 3 mL via RESPIRATORY_TRACT
  Filled 2023-10-16: qty 3

## 2023-10-16 MED ORDER — SODIUM CHLORIDE 0.9 % IV SOLN
1.0000 g | Freq: Once | INTRAVENOUS | Status: AC
  Administered 2023-10-16: 1 g via INTRAVENOUS
  Filled 2023-10-16: qty 10

## 2023-10-16 NOTE — Progress Notes (Signed)
 Troponin result 12243 >> [id number] MD and cardiology team notified new orders to continue trending until peak. Awaiting new collection of follow up sample at this time. MD aware of elevated troponin current plan to medically manage.Patient safety ensured with call light in reach and bed in lowest position. Current plan of care in progress.

## 2023-10-16 NOTE — ED Provider Notes (Signed)
 [place] EMERGENCY DEPARTMENT AT [facility] Provider Note   CSN: [id number] Arrival date & time: 10/16/23  5784     History  Chief Complaint  Patient presents with   Shortness of Breath   Chest Pain    Richard Rivers is a 78 y.o. male.   Shortness of Breath Associated symptoms: chest pain   Chest Pain Associated symptoms: shortness of breath     Patient 78 year old male presents the ED today complaining of chest pain and shortness of breath started this a.m. reports having taken all his medications today.  Previous medical history of CAD, NSTEMI, HFrEF, COPD, PAD, pleural effusion, pulmonary hypertension, hypertension, gout.  Scheduled for a cardiac cath early march.        Home Medications Prior to Admission medications   Medication Sig Start Date End Date Taking? Authorizing Provider  acetaminophen (TYLENOL) 325 MG tablet Take 650 mg by mouth 3 (three) times daily as needed.    [provider]  allopurinol (ZYLOPRIM) 300 MG tablet Take 300 mg by mouth daily.    [provider]  ammonium lactate (LAC-HYDRIN) 12 % lotion Apply 1 Application topically 2 (two) times daily as needed. APPLY TO DRY SKIN ON FEET 04/14/23   [provider]  atorvastatin (LIPITOR) 80 MG tablet Take 80 mg by mouth at bedtime. 04/14/23   [provider]  busPIRone (BUSPAR) 10 MG tablet Take 10 mg by mouth 2 (two) times daily as needed.    [provider]  carvedilol (COREG) 12.5 MG tablet Take 12.5 mg by mouth 2 (two) times daily with a meal.    [provider]  clopidogrel (PLAVIX) 75 MG tablet Take 1 tablet (75 mg total) by mouth daily. 09/16/23   Dorcas Carrow, MD  colchicine 0.6 MG tablet Take 0.6 mg by mouth daily. 10/24/22   [provider]  docusate sodium (COLACE) 50 MG capsule Take 50 mg by mouth daily as needed for mild constipation. Per son, patient has this medication but doesn't use often    [provider]  famotidine (PEPCID) 20 MG tablet Take 20 mg by mouth daily. 04/14/23   [provider]  furosemide (LASIX) 20 MG tablet Take 2 tablets (40 mg total) by mouth daily. Do not dispense earlier prescription of 20 mg daily. 09/16/23 09/15/24  Dorcas Carrow, MD  gabapentin (NEURONTIN) 300 MG capsule Take 600 mg by mouth 3 (three) times daily.    [provider]  guaiFENesin (MUCINEX) 600 MG 12 hr tablet Take 600 mg by mouth every 12 (twelve) hours as needed for cough. 04/14/23   [provider]  hydrOXYzine (ATARAX) 25 MG tablet Take 25 mg by mouth 2 (two) times daily as needed for anxiety. 12/28/22 10/17/23  [provider]  Ipratropium-Albuterol (COMBIVENT RESPIMAT) 20-100 MCG/ACT AERS respimat Inhale 1 puff into the lungs 4 (four) times daily.    [provider]  isosorbide mononitrate (IMDUR) 30 MG 24 hr tablet Take 1 tablet (30 mg total) by mouth daily. 09/16/23   Dorcas Carrow, MD  nitroGLYCERIN (NITROSTAT) 0.4 MG SL tablet Place 1 tablet (0.4 mg total) under the tongue every 5 (five) minutes x 3 doses as needed for chest pain. 09/15/23   Dorcas Carrow, MD  rivaroxaban (XARELTO) 2.5 MG TABS tablet Take 1 tablet (2.5 mg total) by mouth 2 (two) times daily. 09/15/23   Dorcas Carrow, MD  sacubitril-valsartan (ENTRESTO) 24-26 MG Take 1 tablet by mouth 2 (two) times daily. 09/15/23  Dorcas Carrow, MD  traZODone (DESYREL) 50 MG tablet Take 1-3 tablets by mouth at bedtime. Take 2 tablets at night    [provider]      Allergies    Codeine and Penicillin g    Review of Systems   Review of Systems  Respiratory:  Positive for shortness of breath.   Cardiovascular:  Positive for chest pain.  All other systems reviewed and are negative.   Physical Exam Updated Vital Signs BP 112/66   Pulse (!) 113   Temp (!) 97.5 F (36.4 C) (Oral)   Resp (!) 31   Ht 6' (1.829 m)   Wt 68 kg   SpO2 94%   BMI 20.34 kg/m  Physical Exam Constitutional:       General: He is in acute distress.     Appearance: He is ill-appearing and diaphoretic.  HENT:     Head: Normocephalic and atraumatic.  Neck:     Trachea: No tracheal deviation.  Cardiovascular:     Rate and Rhythm: Tachycardia present. Rhythm irregular.     Heart sounds: Murmur heard.  Pulmonary:     Effort: Tachypnea present.     Breath sounds: Examination of the right-upper field reveals wheezing. Examination of the left-upper field reveals wheezing. Examination of the right-middle field reveals wheezing. Examination of the left-middle field reveals wheezing. Examination of the right-lower field reveals wheezing. Examination of the left-lower field reveals wheezing. Wheezing present.  Chest:     Chest wall: No tenderness.  Musculoskeletal:     Right lower leg: No tenderness. Edema present.     Left lower leg: No tenderness. Edema present.  Skin:    General: Skin is warm.  Neurological:     General: No focal deficit present.     Mental Status: He is alert and oriented to person, place, and time.     ED Results / Procedures / Treatments   Labs (all labs ordered are listed, but only abnormal results are displayed) Labs Reviewed  RESP PANEL BY RT-PCR (RSV, FLU A&B, COVID)  RVPGX2 - Abnormal; Notable for the following components:      Result Value   Influenza A by PCR POSITIVE (*)    All other components within normal limits  CBC WITH DIFFERENTIAL/PLATELET - Abnormal; Notable for the following components:   WBC 15.6 (*)    RBC 3.80 (*)    Hemoglobin 10.7 (*)    HCT 35.4 (*)    RDW 16.7 (*)    Neutro Abs 10.7 (*)    Abs Immature Granulocytes 0.21 (*)    All other components within normal limits  COMPREHENSIVE METABOLIC PANEL - Abnormal; Notable for the following components:   Sodium 133 (*)    Potassium 5.6 (*)    CO2 21 (*)    Glucose, Bld 317 (*)    Albumin 3.1 (*)    All other components within normal limits  BRAIN NATRIURETIC PEPTIDE - Abnormal; Notable for the  following components:   B Natriuretic Peptide 910.2 (*)    All other components within normal limits  LACTIC ACID, PLASMA - Abnormal; Notable for the following components:   Lactic Acid, Venous 2.0 (*)    All other components within normal limits  PROTIME-INR - Abnormal; Notable for the following components:   Prothrombin Time 25.5 (*)    INR 2.3 (*)    All other components within normal limits  APTT - Abnormal; Notable for the following components:   aPTT 37 (*)  All other components within normal limits  I-STAT CHEM 8, ED - Abnormal; Notable for the following components:   Sodium 133 (*)    Potassium 5.9 (*)    BUN 24 (*)    Glucose, Bld 317 (*)    Hemoglobin 12.6 (*)    HCT 37.0 (*)    All other components within normal limits  I-STAT CHEM 8, ED - Abnormal; Notable for the following components:   Sodium 133 (*)    Potassium 6.1 (*)    BUN 24 (*)    Glucose, Bld 272 (*)    Hemoglobin 11.6 (*)    HCT 34.0 (*)    All other components within normal limits  CBG MONITORING, ED - Abnormal; Notable for the following components:   Glucose-Capillary 107 (*)    All other components within normal limits  TROPONIN I (HIGH SENSITIVITY) - Abnormal; Notable for the following components:   Troponin I (High Sensitivity) 78 (*)    All other components within normal limits  TROPONIN I (HIGH SENSITIVITY) - Abnormal; Notable for the following components:   Troponin I (High Sensitivity) 314 (*)    All other components within normal limits  CULTURE, BLOOD (ROUTINE X 2)  CULTURE, BLOOD (ROUTINE X 2)  MAGNESIUM  LACTIC ACID, PLASMA  URINALYSIS, W/ REFLEX TO CULTURE (INFECTION SUSPECTED)  APTT    EKG EKG Interpretation Date/Time:  Saturday October 16 2023 06:52:12 EST Ventricular Rate:  133 PR Interval:  37 QRS Duration:  190 QT Interval:  407 QTC Calculation: 606 R Axis:   -30  Text Interpretation: Sinus tachycardia Left bundle branch block no changes Confirmed by Eber Hong  [phone]) on 10/16/2023 7:19:25 AM  Radiology DG Chest Portable 1 View Result Date: 10/16/2023 CLINICAL DATA:  Shortness of breath, chest pain EXAM: PORTABLE CHEST 1 VIEW COMPARISON:  09/12/2023 FINDINGS: Patchy pulmonary opacity greatest at the left base. No Kerley lines, significant effusion, or pneumothorax. No cardiac enlargement, mediastinal contours distorted by rotation. IMPRESSION: Patchy infiltrate in the lower lungs greater on the left, usually from pneumonia. Electronically Signed   By: Tiburcio Pea M.D.   On: 10/16/2023 07:32    Procedures .Critical Care  Performed by: Lunette Stands, PA-C Authorized by: Lunette Stands, PA-C   Critical care provider statement:    Critical care time (minutes):  59   Critical care time was exclusive of:  Separately billable procedures and treating other patients   Critical care was necessary to treat or prevent imminent or life-threatening deterioration of the following conditions:  Respiratory failure   Critical care was time spent personally by me on the following activities:  Blood draw for specimens, development of treatment plan with patient or surrogate, discussions with consultants, evaluation of patient's response to treatment, examination of patient, interpretation of cardiac output measurements, obtaining history from patient or surrogate, ordering and performing treatments and interventions, ordering and review of laboratory studies, ordering and review of radiographic studies, pulse oximetry, re-evaluation of patient's condition and review of old charts   I assumed direction of critical care for this patient from another provider in my specialty: yes     Care discussed with: admitting provider       Medications Ordered in ED Medications  albuterol (PROVENTIL) (2.5 MG/3ML) 0.083% nebulizer solution (0 mg/hr Nebulization Stopped 10/16/23 0815)  azithromycin (ZITHROMAX) 500 mg in sodium chloride 0.9 % 250 mL IVPB (0 mg Intravenous Stopped  10/16/23 0938)  heparin ADULT infusion 100 units/mL (25000 units/249mL) (800 Units/hr  Intravenous New Bag/Given 10/16/23 0827)  cefTRIAXone (ROCEPHIN) 2 g in sodium chloride 0.9 % 100 mL IVPB (has no administration in time range)  sodium zirconium cyclosilicate (LOKELMA) packet 5 g (has no administration in time range)  ipratropium-albuterol (DUONEB) 0.5-2.5 (3) MG/3ML nebulizer solution 3 mL (3 mLs Nebulization Given 10/16/23 0700)  calcium gluconate inj 10% (1 g) URGENT USE ONLY! (1 g Intravenous Given 10/16/23 0752)  insulin aspart (novoLOG) injection 10 Units (10 Units Intravenous Given 10/16/23 0752)  sodium bicarbonate injection 50 mEq (50 mEq Intravenous Given 10/16/23 0752)  cefTRIAXone (ROCEPHIN) 1 g in sodium chloride 0.9 % 100 mL IVPB (0 g Intravenous Stopped 10/16/23 0852)  heparin bolus via infusion 2,000 Units (2,000 Units Intravenous Bolus from Bag 10/16/23 0827)  fentaNYL (SUBLIMAZE) injection 12.5 mcg (12.5 mcg Intravenous Given 10/16/23 1006)    ED Course/ Medical Decision Making/ A&P                                Medical Decision Making  This patient is a 78 year old male, who presents to the ED for concern of chest pain and shortness of breath.   Differential diagnoses prior to evaluation: The emergent differential diagnosis includes, but is not limited to, ACS, pneumonia, COPD exacerbation, arrhythmia, hyperkalemia, sepsis. This is not an exhaustive differential.   Past Medical History / Co-morbidities / Social History: CAD, PAD, COPD, NSTEMI, HLD, HFrEF, gout Currently on Plavix  Additional history: Chart reviewed. Pertinent results include:  Noted to have a previous history of alcohol dependence, tobacco dependence, pulmonary nodules.   Was seen on 09/01/2023 for cough and shortness of breath.  hypoxic respiratory failure requiring 2 L.  TTE showed new systolic dysfunction with an EF of 35 to 40% grade 2 DD, low RV function and severe pulmonary hypertension.  Demonstrated  mild MR and mild to moderate TR.  Lab Tests/Imaging studies: I personally interpreted labs/imaging and the pertinent results include:   CMP was notable for mild hyponatremia of 133, hyperkalemia 5.6, glucose of 317 CBC was notable for elevated WBC of 15.6 and a mild anemia with a hemoglobin of 10.7 Magnesium 1.7 BMP was 910.2 Troponin was 78 delta 314 PT 25.5, INR 2.3, APTT 37 Initial lactic acid 2.0 Resp panel positive for Influenza A Blood cultures pending UA pending  Chest x-ray shows left lower lobe pneumonia I agree with the radiologist interpretation.  Cardiac monitoring: EKG obtained and interpreted by myself and attending physician which shows: A flutter  EKG Interpretation Date/Time:  Saturday October 16 2023 06:52:12 EST Ventricular Rate:  133 PR Interval:  37 QRS Duration:  190 QT Interval:  407 QTC Calculation: 606 R Axis:   -30  Text Interpretation: Sinus tachycardia Left bundle branch block no changes Confirmed by Eber Hong ([id number]) on 10/16/2023 7:19:25 AM          Medications: I ordered medication including fentanyl, Rocephin, azithromycin, heparin, calcium gluconate, NovoLog, sodium bicarb, DuoNebs, albuterol.  I have reviewed the patients home medicines and have made adjustments as needed.  Critical Interventions: Patient was provided heparin as well as calcium gluconate and insulin and sodium bicarb and albuterol for hyperkalemia in the presence of atrial flutter.  ED Course:  Patient is a 78 year old male presents ED today complaining of shortness of breath and chest pain that started this morning.  Past medical history of CAD, PAD, HFrEF, NSTEMI.  Currently on Plavix.  Was recently discharged on 09/12/2023 after  NSTEMI and was scheduled to have a catheterization early March.  1/13 TTE showed an EF of 35 to 40% with a grade 2 diastolic dysfunction and severe pulmonary hypertension.  On physical exam, wheezes were noted bilaterally and patient seemed  in acute distress, tachycardic, tachypneic.  Patient oxygen saturation was 98% on 2 L.  Was only able to answer questions with 1-2 word responses.  ECG was notable for V. tach versus a flutter.  Patient was started on DuoNebs.  Patient was noted to be hyperkalemic and acidotic on i-STAT and calcium, insulin, bicarb were given.  Repeat i-STAT was done.  Albuterol was also repeated.  CMP was done which showed glucose of 317, potassium of 5.6.  Chest x-ray also was notable for left lower lobe pneumonia.  Cardiology was then consulted and confirmed a flutter and wished to start the patient on heparin and admit to medicine.  No need for any beta-blocker or calcium channel blocker.  Patient was started on ceftriaxone and azithromycin for pneumonia and heparin drip was put into pharmacy per cardiology.  Patient care was worked in conjunction with attending.  Attending called Dr. Ophelia Charter and patient was admitted to hospitalist.  Disposition: After consideration of the diagnostic results and the patients response to treatment, I feel that the patient benefit from admission. Patient care was then transferred over to hospitalist.  Patient care transferred to Dr. Ophelia Charter.  Final Clinical Impression(s) / ED Diagnoses Final diagnoses:  Typical atrial flutter (HCC)  Pneumonia of left lower lobe due to infectious organism  Sepsis, due to unspecified organism, unspecified whether acute organ dysfunction present [facility])  Hyperkalemia  Respiratory distress    Rx / DC Orders ED Discharge Orders     None         Lavonia Drafts 10/16/23 1036    Eber Hong, MD 10/17/23 [phone]

## 2023-10-16 NOTE — Progress Notes (Signed)
 Patient arrived from .ED VSS alert and oriented x 4 CCMD notifed, CHG bath completed, Bed in lowest position with call light in reach current plan of care in progress

## 2023-10-16 NOTE — ED Triage Notes (Signed)
 Patient BIB EMS from home. Patient woke up with chest pain & ShOB about an hour ago.  He has a scheduled cardiac cath on 3/3.  He took one Nitroglycerin tab prior to EMS arrival. EMS gave 324 ASA.

## 2023-10-16 NOTE — ED Notes (Signed)
 CCMD called.
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
## 2023-10-16 NOTE — ED Notes (Signed)
 PA Bauer notified of troponin level, and that patient is still having pain. Requested pain meds for patient

## 2023-10-16 NOTE — ED Notes (Signed)
 Dr Ophelia Charter made aware of trop of [phone]

## 2023-10-16 NOTE — ED Provider Notes (Signed)
 This patient is a critically ill-appearing 78 year old male, presents with shortness of breath for the last several days, on exam the patient is in severe distress with tachypnea prolonged expiratory phase diffuse wheezing and rales.  He is only able to speak in very shortened sentences, he is severely tachycardic with what appears to be a new left bundle branch block.  He is very regular around 135 bpm and could be consistent with atrial flutter.  He was also found to be hyperkalemic, somewhere around 5.6-5.9, he has not had this in the past and his renal function seems to be close to baseline.  He has a leukocytosis of 15,000, a recent admission for an NSTEMI, he is followed by the [facility] with cardiology.  I discussed the case with cardiology, they recommended that the patient be started on heparin, they recommended against Cardizem or beta-blockers at this time, he has a known poor ejection fraction, he will need to be admitted to the hospitalist service for an unassigned medical service as he does not have a local primary care physician.  He is critically ill, his x-ray confirms a left lower lobe infiltrate, antibiotics added.  .Critical Care  Performed by: Eber Hong, MD Authorized by: Eber Hong, MD   Critical care provider statement:    Critical care time (minutes):  30   Critical care time was exclusive of:  Teaching time and separately billable procedures and treating other patients   Critical care was necessary to treat or prevent imminent or life-threatening deterioration of the following conditions:  Sepsis and cardiac failure   Critical care was time spent personally by me on the following activities:  Development of treatment plan with patient or surrogate, discussions with consultants, evaluation of patient's response to treatment, examination of patient, ordering and review of laboratory studies, ordering and review of radiographic studies, ordering and performing treatments and  interventions, pulse oximetry, re-evaluation of patient's condition, review of old charts and obtaining history from patient or surrogate   I assumed direction of critical care for this patient from another provider in my specialty: no     Care discussed with: admitting provider   Comments:        Final diagnoses:  Typical atrial flutter (HCC)  Pneumonia of left lower lobe due to infectious organism  Sepsis, due to unspecified organism, unspecified whether acute organ dysfunction present (HCC)  Hyperkalemia  Respiratory distress      Eber Hong, MD 10/17/23 0700
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
## 2023-10-16 NOTE — Progress Notes (Signed)
 PHARMACY - ANTICOAGULATION CONSULT NOTE  Pharmacy Consult for IV heparin Indication: chest pain/ACS  Allergies  Allergen Reactions   Codeine Itching and Swelling    Throat swelling   Penicillin G Swelling    Throat    Patient Measurements: Height: 6' (182.9 cm) Weight: 68 kg (150 lb) IBW/kg (Calculated) : 77.6 Heparin Dosing Weight: 68 kg  Vital Signs: Temp: 98 F (36.7 C) (02/15 1426) Temp Source: Oral (02/15 1426) BP: 120/67 (02/15 1421) Pulse Rate: 98 (02/15 1421)  Labs: Recent Labs    10/16/23 0653 10/16/23 0657 10/16/23 0701 10/16/23 0744 10/16/23 0836 10/16/23 1422  HGB 10.7* 12.6*  --  11.6*  --   --   HCT 35.4* 37.0*  --  34.0*  --   --   PLT 393  --   --   --   --   --   APTT  --   --  37*  --   --  51*  LABPROT  --   --  25.5*  --   --   --   INR  --   --  2.3*  --   --   --   CREATININE 1.21 1.10  --  1.20  --   --   TROPONINIHS 78*  --   --   --  314*  --     Estimated Creatinine Clearance: 48.8 mL/min (by C-G formula based on SCr of 1.2 mg/dL).     Assessment: Richard Rivers is a 78 y.o. year old male admitted on 10/16/2023 with concern for ACS. Pt had a planned outpatient cardiac cath on 3/3. Xarelto prior to admission for PAD. Pharmacy consulted to dose heparin.  Initial PTT 51 seconds  Goal of Therapy:  Heparin level 0.3-0.7 units/ml aPTT 66-102 seconds Monitor platelets by anticoagulation protocol: Yes   Plan:  Increase heparin to 1000 units / hr 8 hour PTT  Thank you Okey Regal, PharmD 10/16/2023,3:21 PM

## 2023-10-17 ENCOUNTER — Inpatient Hospital Stay (HOSPITAL_COMMUNITY): Payer: No Typology Code available for payment source

## 2023-10-17 DIAGNOSIS — I214 Non-ST elevation (NSTEMI) myocardial infarction: Secondary | ICD-10-CM | POA: Diagnosis not present

## 2023-10-17 DIAGNOSIS — I4891 Unspecified atrial fibrillation: Secondary | ICD-10-CM | POA: Diagnosis not present

## 2023-10-17 DIAGNOSIS — J189 Pneumonia, unspecified organism: Secondary | ICD-10-CM

## 2023-10-17 LAB — TROPONIN I (HIGH SENSITIVITY): Troponin I (High Sensitivity): 12999 ng/L (ref <18)

## 2023-10-17 LAB — BASIC METABOLIC PANEL
Anion gap: 11 (ref 5–15)
BUN: 23 mg/dL (ref 8–23)
CO2: 23 mmol/L (ref 22–32)
Calcium: 9.6 mg/dL (ref 8.9–10.3)
Chloride: 101 mmol/L (ref 98–111)
Creatinine, Ser: 1.23 mg/dL (ref 0.61–1.24)
GFR, Estimated: >60 mL/min (ref >60)
Glucose, Bld: 94 mg/dL (ref 70–99)
Potassium: 4.9 mmol/L (ref 3.5–5.1)
Sodium: 135 mmol/L (ref 135–145)

## 2023-10-17 LAB — ECHOCARDIOGRAM COMPLETE
Area-P 1/2: 3.46 cm2
Calc EF: 34.9 %
Height: 72 in
S' Lateral: 4 cm
Single Plane A2C EF: 34.3 %
Single Plane A4C EF: 36.7 %
Weight: 2400 [oz_av]

## 2023-10-17 LAB — CBC
HCT: 27.4 % — ABNORMAL LOW (ref 39.0–52.0)
Hemoglobin: 8.4 g/dL — ABNORMAL LOW (ref 13.0–17.0)
MCH: 27.8 pg (ref 26.0–34.0)
MCHC: 30.7 g/dL (ref 30.0–36.0)
MCV: 90.7 fL (ref 80.0–100.0)
Platelets: 270 10*3/uL (ref 150–400)
RBC: 3.02 MIL/uL — ABNORMAL LOW (ref 4.22–5.81)
RDW: 16.5 % — ABNORMAL HIGH (ref 11.5–15.5)
WBC: 15.2 10*3/uL — ABNORMAL HIGH (ref 4.0–10.5)
nRBC: 0 % (ref 0.0–0.2)

## 2023-10-17 LAB — APTT
aPTT: 58 s — ABNORMAL HIGH (ref 24–36)
aPTT: 71 s — ABNORMAL HIGH (ref 24–36)

## 2023-10-17 LAB — GLUCOSE, CAPILLARY
Glucose-Capillary: 103 mg/dL — ABNORMAL HIGH (ref 70–99)
Glucose-Capillary: 99 mg/dL (ref 70–99)
Glucose-Capillary: 149 mg/dL — ABNORMAL HIGH (ref 70–99)
Glucose-Capillary: 79 mg/dL (ref 70–99)

## 2023-10-17 LAB — HEPARIN LEVEL (UNFRACTIONATED): Heparin Unfractionated: 1.04 [IU]/mL — ABNORMAL HIGH (ref 0.30–0.70)

## 2023-10-17 MED ORDER — ASPIRIN 81 MG PO CHEW
81.0000 mg | CHEWABLE_TABLET | ORAL | Status: AC
  Administered 2023-10-18: 81 mg via ORAL
  Filled 2023-10-17: qty 1

## 2023-10-17 MED ORDER — FUROSEMIDE 10 MG/ML IJ SOLN
40.0000 mg | Freq: Once | INTRAMUSCULAR | Status: AC
  Administered 2023-10-17: 40 mg via INTRAVENOUS
  Filled 2023-10-17: qty 4

## 2023-10-17 MED ORDER — DEXTROSE 5 % IV SOLN
500.0000 mg | INTRAVENOUS | Status: DC
  Filled 2023-10-17: qty 5

## 2023-10-17 MED ORDER — ASPIRIN 81 MG PO TBEC
81.0000 mg | DELAYED_RELEASE_TABLET | Freq: Every day | ORAL | Status: DC
  Administered 2023-10-17 – 2023-10-18 (×2): 81 mg via ORAL
  Filled 2023-10-17 (×2): qty 1

## 2023-10-17 MED ORDER — SODIUM CHLORIDE 0.9 % IV SOLN: INTRAVENOUS | Status: DC

## 2023-10-17 NOTE — Progress Notes (Signed)
 Echocardiogram 2D Echocardiogram has been performed.  Warren Lacy Tynetta Bachmann RDCS 10/17/2023, 11:43 AM

## 2023-10-17 NOTE — Progress Notes (Signed)
 PHARMACY - ANTICOAGULATION CONSULT NOTE  Pharmacy Consult for IV heparin Indication:  NSTEMI and PAD   Allergies  Allergen Reactions   Codeine Itching and Swelling    Throat swelling   Penicillin G Swelling    Throat    Patient Measurements: Height: 6' (182.9 cm) Weight: 68 kg (150 lb) IBW/kg (Calculated) : 77.6 Heparin Dosing Weight: 68 kg  Vital Signs: Temp: 98.1 F (36.7 C) (02/16 0437) Temp Source: Oral (02/16 0437) BP: 126/64 (02/16 0822) Pulse Rate: 98 (02/16 0822)  Labs: Recent Labs    10/16/23 0653 10/16/23 0657 10/16/23 0657 10/16/23 0701 10/16/23 0744 10/16/23 0836 10/16/23 1422 10/16/23 1647 10/17/23 0016 10/17/23 0302 10/17/23 0858  HGB 10.7* 12.6*  --   --  11.6*  --   --   --   --  8.4*  --   HCT 35.4* 37.0*  --   --  34.0*  --   --   --   --  27.4*  --   PLT 393  --   --   --   --   --   --   --   --  270  --   APTT  --   --    < > 37*  --   --  51*  --  58*  --  71*  LABPROT  --   --   --  25.5*  --   --   --   --   --   --   --   INR  --   --   --  2.3*  --   --   --   --   --   --   --   CREATININE 1.21 1.10  --   --  1.20  --  1.24  --   --  1.23  --   TROPONINIHS 78*  --   --   --   --  314* 12,243* 20,977*  --   --   --    < > = values in this interval not displayed.    Estimated Creatinine Clearance: 47.6 mL/min (by C-G formula based on SCr of 1.23 mg/dL).   Assessment: Richard Rivers is a 78 y.o. year old male admitted on 10/16/2023 with concern for ACS. Pt had a planned outpatient cardiac cath on 3/3. Xarelto prior to admission for PAD (last dose 2/14 PM). Pharmacy consulted to dose heparin.  AM aPTT 71 seconds (therapeutic) and heparin level 1.04 on heparin infusion at 1,200 units/hour. Heparin level and aPTT not correlating yet. CBC okay - hgb 8.4, plts WNL.   Goal of Therapy:  Heparin level 0.3-0.7 units/ml aPTT 66-102 seconds Monitor platelets by anticoagulation protocol: Yes   Plan:  Continue heparin at 1,200  units/hr Daily heparin level and CBC  Roslyn Smiling, PharmD PGY1 Pharmacy Resident 10/17/2023 10:35 AM

## 2023-10-17 NOTE — Evaluation (Signed)
 Physical Therapy Evaluation Patient Details Name: Richard Rivers MRN: [id number] DOB: [birth year] Today's Date: 10/17/2023  History of Present Illness  Pt is a 78 y.o. male who presented 10/16/23 with chest pain and SOB. Admitted with multilobar PNA, afib with RVR, and NSTEMI. PMH significant for HTN, HLD, CAD with stent placement, NSETMI, gout, depression with anxiety, tobacco use, PAD, HFrEF, COPD   Clinical Impression  Pt presents with condition above and deficits mentioned below, see PT Problem List. PTA, he was mod I utilizing a rollator in the home and to get into the [place]. He uses his power chair in the community. He lives with his step-son in a 1-level house with a ramped entrance. Currently, pt demonstrates deficits in generalized strength, balance, power, and activity tolerance/endurance. He was able to perform bed mobility mod I and perform transfers and ambulate up to ~90 ft with a RW with CGA for safety today. Provided pt with an IS and educated him on utilizing it 10x/hour. He verbalized understanding. Will continue to follow acutely and recommend follow-up with HHPT to maximize his independence and safety with functional mobility and his return to baseline.      If plan is discharge home, recommend the following: A little help with bathing/dressing/bathroom;Assistance with cooking/housework;Assist for transportation   Can travel by private vehicle        Equipment Recommendations None recommended by PT  Recommendations for Other Services       Functional Status Assessment Patient has had a recent decline in their functional status and demonstrates the ability to make significant improvements in function in a reasonable and predictable amount of time.     Precautions / Restrictions Precautions Precautions: Fall;Other (comment) Recall of Precautions/Restrictions: Intact Precaution/Restrictions Comments: watch SpO2 Restrictions Weight Bearing Restrictions Per Provider Order:  No      Mobility  Bed Mobility Overal bed mobility: Modified Independent             General bed mobility comments: No assistance needed to transition supine to sit R EOB    Transfers Overall transfer level: Needs assistance Equipment used: Rolling walker (2 wheels), None (IV pole) Transfers: Sit to/from Stand, Bed to chair/wheelchair/BSC Sit to Stand: Contact guard assist   Step pivot transfers: Contact guard assist       General transfer comment: Pt stood from EOB to no AD 1x then held onto IV pole with 1 UE for support to step pivot to R to recliner. Pt then stood again from recliner to RW. No LOB, CGA for safety    Ambulation/Gait Ambulation/Gait assistance: Contact guard assist Gait Distance (Feet): 90 Feet (x2 bouts of ~2 ft > ~90 ft) Assistive device: IV Pole, Rolling walker (2 wheels) Gait Pattern/deviations: Step-through pattern, Decreased stride length, Decreased dorsiflexion - left, Trunk flexed Gait velocity: reduced Gait velocity interpretation: <1.8 ft/sec, indicate of risk for recurrent falls   General Gait Details: Pt ambulates with a step-through gait pattern with noted decreased L foot clearance (baseline per pt) and increased trunk flexion. Pt needs VCs to improve upright posture. Pt took a few steps the first bout with x1 UE support on IV pole then utilized a RW the second bout. No LOB, CGA for safety  Stairs            Wheelchair Mobility     Tilt Bed    Modified Rankin (Stroke Patients Only)       Balance Overall balance assessment: Needs assistance Sitting-balance support: No upper extremity supported, Feet supported  Sitting balance-[name]: Good     Standing balance support: Single extremity supported, Bilateral upper extremity supported, No upper extremity supported, During functional activity Standing balance-[name]: Fair Standing balance comment: Able to stand statically without UE support but benefits from 1-2 UE  support for dynamic standing balance                             Pertinent Vitals/Pain Pain Assessment Pain Assessment: No/denies pain    Home Living Family/patient expects to be discharged to:: Private residence Living Arrangements: Children (step-son) Available Help at Discharge: Family;Friend(s);Available 24 hours/day Type of Home: House Home Access: Ramped entrance       Home Layout: One level Home Equipment: Wheelchair - power;Rollator (4 wheels);Grab bars - toilet;Grab bars - tub/shower;Shower seat - built in;Cane - single point Additional Comments: began to need 2L O2 for mobility since last admission    Prior Function Prior Level of Function : Needs assist;History of Falls (last six months)             Mobility Comments: Mod I for mobility, uses rollator in the home and to get into the [place]. Uses power chair in the community ADLs Comments: Manages ADLs including showering though stepson present for supervision/safety. [name] manages IADLs including med mgmt.     Extremity/Trunk Assessment   Upper Extremity Assessment Upper Extremity Assessment: Defer to OT evaluation    Lower Extremity Assessment Lower Extremity Assessment: LLE deficits/detail;Generalized weakness LLE Deficits / Details: hx of limited L foot clearance with gait due to "nerve damage" from prior injuries at [place] per pt    Cervical / Trunk Assessment Cervical / Trunk Assessment: Normal  Communication   Communication Communication: No apparent difficulties    Cognition Arousal: Alert Behavior During Therapy: WFL for tasks assessed/performed   PT - Cognitive impairments: No apparent impairments                         Following commands: Intact       Cueing Cueing Techniques: Verbal cues     General Comments General comments (skin integrity, edema, etc.): SpO2 >/= 89% on RA when ambulating, redonned [place] end of session    Exercises Other Exercises Other  Exercises: provided pt with IS and educated pt on using it x10/hour, pt performed several reps while supine   Assessment/Plan    PT Assessment Patient needs continued PT services  PT Problem List Decreased strength;Decreased activity tolerance;Decreased mobility;Decreased balance;Cardiopulmonary status limiting activity       PT Treatment Interventions DME instruction;Gait training;Functional mobility training;Stair training;Therapeutic activities;Therapeutic exercise;Balance training;Neuromuscular re-education;Patient/family education    PT Goals (Current goals can be found in the Care Plan section)  Acute Rehab PT Goals Patient Stated Goal: to improve PT Goal Formulation: With patient Time For Goal Achievement: 10/31/23 Potential to Achieve Goals: Good    Frequency Min 1X/week     Co-evaluation               AM-PAC PT "6 Clicks" Mobility  Outcome Measure Help needed turning from your back to your side while in a flat bed without using bedrails?: None Help needed moving from lying on your back to sitting on the side of a flat bed without using bedrails?: None Help needed moving to and from a bed to a chair (including a wheelchair)?: A Little Help needed standing up from a chair using your arms (e.g., wheelchair or  bedside chair)?: A Little Help needed to walk in hospital room?: A Little Help needed climbing 3-5 steps with a railing? : A Little 6 Click Score: 20    End of Session Equipment Utilized During Treatment: Oxygen Activity Tolerance: Patient tolerated treatment well Patient left: in chair;with call bell/phone within reach;with chair alarm set (no grey cord to wall from chair alarm box) Nurse Communication: Mobility status;Other (comment) (no grey cord to wall from chair alarm box; SpO2) PT Visit Diagnosis: Unsteadiness on feet (R26.81);Other abnormalities of gait and mobility (R26.89);Muscle weakness (generalized) (M62.81);Difficulty in walking, not elsewhere  classified (R26.2)    Time: 1610-9604 PT Time Calculation (min) (ACUTE ONLY): 22 min   Charges:   PT Evaluation $PT Eval Moderate Complexity: 1 Mod   PT General Charges $$ ACUTE PT VISIT: 1 Visit         Virgil Benedict, PT, DPT Acute Rehabilitation Services  Office: [phone]   Bettina Gavia 10/17/2023, 1:42 PM

## 2023-10-17 NOTE — Evaluation (Signed)
 Occupational Therapy Evaluation Patient Details Name: Richard Rivers MRN: [id number] DOB: 09-01-1945 Today's Date: 10/17/2023   History of Present Illness   Pt is a 78 y.o. male who presented 10/16/23 with chest pain and SOB. Admitted with multilobar PNA, afib with RVR, and NSTEMI. PMH significant for HTN, HLD, CAD with stent placement, NSETMI, gout, depression with anxiety, tobacco use, PAD, HFrEF, COPD     Clinical Impressions At baseline, pt completes ADLs Independent to Supervision and performs functional mobility Mod I with a Rollator in the home and a power wheelchair in the community. At baseline, pt receives assistance from his stepson and personal care attendant  for IADLs. Pt now presents with decreased activity tolerance, generalized B UE weakness, increased fatigue, pain affecting functional level, decreased balance, and decreased safety and independence with functional tasks. Pt currently demonstrates ability to complete ADLs Independent to Contact guard assist and functional transfers with a RW with Contact guard assist for safety. Pt VSS on 2L continuous O2 through nasal cannula throughout session. Pt participated well in session and is motivated to return to PLOF. Pt will benefit from acute skilled OT services to address deficits outlined below and to increase safety and independence with functional tasks. No post-acute skilled OT needs are anticipated at this time.      If plan is discharge home, recommend the following:   A little help with walking and/or transfers;A little help with bathing/dressing/bathroom;Assistance with cooking/housework;Direct supervision/assist for medications management;Help with stairs or ramp for entrance;Assist for transportation     Functional Status Assessment   Patient has had a recent decline in their functional status and demonstrates the ability to make significant improvements in function in a reasonable and predictable amount of time.      Equipment Recommendations   None recommended by OT (Pt already has needed equipment)     Recommendations for Other Services         Precautions/Restrictions   Precautions Precautions: Fall;Other (comment) Recall of Precautions/Restrictions: Intact Precaution/Restrictions Comments: watch SpO2 Restrictions Weight Bearing Restrictions Per Provider Order: No     Mobility Bed Mobility Overal bed mobility: Modified Independent             General bed mobility comments: No assistance needed to transition supine to sit R EOB    Transfers Overall transfer level: Needs assistance Equipment used: Rolling walker (2 wheels) Transfers: Sit to/from Stand, Bed to chair/wheelchair/BSC Sit to Stand: Contact guard assist     Step pivot transfers: Contact guard assist            Balance Overall balance assessment: Needs assistance Sitting-balance support: No upper extremity supported, Feet supported Sitting balance-[name]: Good     Standing balance support: Single extremity supported, Bilateral upper extremity supported, No upper extremity supported, During functional activity Standing balance-[name]: Fair Standing balance comment: Able to stand statically without UE support but benefits from 1-2 UE support for dynamic standing balance                           ADL either performed or assessed with clinical judgement   ADL Overall ADL's : Needs assistance/impaired Eating/Feeding: Modified independent;Bed level   Grooming: Set up;Sitting   Upper Body Bathing: Set up;Sitting   Lower Body Bathing: Supervison/ safety;Sitting/lateral leans;Contact guard assist;Sit to/from stand   Upper Body Dressing : Contact guard assist;Sitting Upper Body Dressing Details (indicate cue type and reason): assist with lines Lower Body Dressing: Supervision/safety;Sitting/lateral leans;Contact  guard assist;Sit to/from stand   Toilet Transfer: Contact guard  assist;Rolling walker (2 wheels);BSC/3in1 (step-pivot) Toilet Transfer Details (indicate cue type and reason): simulated at EOB Toileting- Clothing Manipulation and Hygiene: Modified independent;Sitting/lateral lean;Contact guard assist;Sit to/from stand         General ADL Comments: Pt with decreased activity tolerance, fatiguing quickly during tasks     Vision Patient Visual Report: No change from baseline       Perception         Praxis         Pertinent Vitals/Pain Pain Assessment Pain Assessment: Faces Faces Pain Scale: Hurts little more Pain Location: chest Pain Descriptors / Indicators: Discomfort, Grimacing, Sore Pain Intervention(s): Limited activity within patient's tolerance, Monitored during session     Extremity/Trunk Assessment Upper Extremity Assessment Upper Extremity Assessment: Left hand dominant;Generalized weakness;LUE deficits/detail (often uses R hand during functional tasks) LUE Deficits / Details: Pt reports sustaining injury to Left hand while serving in [place] that caused nerve damage/decreased sensation affecting functional use. ROM throughout L UE remains WFL. LUE Sensation: history of peripheral neuropathy;decreased light touch (hx of nerve damage from injury) LUE Coordination: decreased fine motor   Lower Extremity Assessment Lower Extremity Assessment: Defer to PT evaluation   Cervical / Trunk Assessment Cervical / Trunk Assessment: Normal   Communication Communication Communication: No apparent difficulties   Cognition Arousal: Alert Behavior During Therapy: WFL for tasks assessed/performed               OT - Cognition Comments: AAOx4 and pleasant throughout session. Cognition WNL for tasks completed, not formally assessed.                 Following commands: Intact       Cueing  General Comments          Exercises     Shoulder Instructions      Home Living Family/patient expects to be discharged to::  Private residence Living Arrangements: Children Available Help at Discharge: Family;Friend(s);Available 24 hours/day;Personal care attendant Type of Home: House Home Access: Ramped entrance     Home Layout: One level     Bathroom Shower/Tub: Producer, television/film/video: Handicapped height     Home Equipment: Wheelchair - power;Rollator (4 wheels);Grab bars - toilet;Grab bars - tub/shower;Shower seat - built in;Cane - single point   Additional Comments: Pt began to need 2L O2 for mobility since last admission. Pt reports he has a personal care attendent who comes 2 days a week for 6 hours a day.      Prior Functioning/Environment Prior Level of Function : Needs assist;History of Falls (last six months)             Mobility Comments: Mod I for mobility, uses rollator in the home and to get into the [place]. Uses power chair in the community ADLs Comments: Completes ADLs Independent to Mod I including showering though stepson present for supervision/safety. Doreene Adas assists with IADLs including med mgmt. 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    OT Problem List: Decreased strength;Decreased activity tolerance;Impaired balance (sitting and/or standing);Cardiopulmonary status limiting activity   OT Treatment/Interventions: Self-care/ADL training;Energy conservation;DME and/or AE instruction;Therapeutic exercise;Therapeutic activities;Balance training;Patient/family education      OT Goals(Current goals can be found in the care plan section)   Acute Rehab OT Goals Patient Stated Goal: to feel better and return home OT Goal Formulation: With patient Time For Goal Achievement: 10/31/23 Potential to Achieve Goals: Good ADL Goals Pt  Will Perform Grooming: with modified independence;standing Pt Will Perform Lower Body Bathing: with modified independence;sitting/lateral leans;sit to/from stand Pt Will Perform Lower Body Dressing: with modified independence;sitting/lateral  leans;sit to/from stand Pt Will Transfer to Toilet: with modified independence;ambulating;regular height toilet (with least restrictive AD) Pt Will Perform Toileting - Clothing Manipulation and hygiene: with modified independence;sit to/from stand;sitting/lateral leans Pt/caregiver will Perform Home Exercise Program: Increased strength;Both right and left upper extremity;With theraband;Independently;With written HEP provided (Increased activity tolerance)   OT Frequency:  Min 1X/week    Co-evaluation              AM-PAC OT "6 Clicks" Daily Activity     Outcome Measure Help from another person eating meals?: None Help from another person taking care of personal grooming?: A Little Help from another person toileting, which includes using toliet, bedpan, or urinal?: A Little Help from another person bathing (including washing, rinsing, drying)?: A Little Help from another person to put on and taking off regular upper body clothing?: A Little Help from another person to put on and taking off regular lower body clothing?: A Little 6 Click Score: 19   End of Session Equipment Utilized During Treatment: Rolling walker (2 wheels) Nurse Communication: Mobility status;Other (comment) (OT POC)  Activity Tolerance: Patient tolerated treatment well;Patient limited by fatigue Patient left: in bed;with call bell/phone within reach  OT Visit Diagnosis: Other abnormalities of gait and mobility (R26.89);Muscle weakness (generalized) (M62.81);Other (comment) (decreased activity tolerance)                Time: 1191-4782 OT Time Calculation (min): 15 min Charges:  OT General Charges $OT Visit: 1 Visit OT Evaluation $OT Eval Low Complexity: 1 Low  Alizzon Dioguardi "Kyle" M., OTR/L, MA Acute Rehab [phone]   Lendon Colonel 10/17/2023, 5:22 PM

## 2023-10-17 NOTE — Hospital Course (Addendum)
 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 He is scheduled for cath at the [facility] on 3/3. He was diagnosed with PNA and is being treated with Ceftriaxone and Azithromycin.  Once again, he has an NSTEMI with troponin elevation up to [address]00+ and is on heparin drip.  He is likely to need cardiac catheterization prior to dc, once medically stable.  Hgb fell to 7.1 on 2/17 and he was transfused 2 units PRBC.

## 2023-10-17 NOTE — Progress Notes (Signed)
 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 He is scheduled for cath at the [place] on 3/3. He was diagnosed with PNA and is being treated with Ceftriaxone and Azithromycin.  Once again, he has an NSTEMI with troponin elevation up to 20000+ and is on heparin drip.  He is likely to need cardiac catheterization prior to dc, once medically stable.  Chest CT is pending.  Echo also pending.  Assessment and Plan:  Multilobar PNA Patient presenting with nonproductive cough, chest discomfort, and multifocal infiltrates on chest x-ray and subsequent chest CT This appears to be most likely community-acquired pneumonia; he was previously positive for influenza and is still testing positive, but this is a lesser consideration Blood/sputum cultures are pending Admitted to progressive care Continue Ceftriaxone x 7 days/Azithromycin x 5 days Will add albuterol PRN Will continue Mucinex for cough   Non-ST elevation MI  Patient declined to have cardiac cath during prior hospitalization and also recommended medical management He has known disease and significantly elevated troponin and is very likely to need cardiac catheterization prior to dc Will continue heparin for now Limited echocardiogram similar to one from 09/03/2023.  EF 35 to 40%.  Repeat is pending Continue Plavix Hold Xarelto, as he is on heparin infusion (will need clarification on dosing at time of discharge) Continue Imdur, carvedilol Cardiology is consulting and planning to do LHC and RHC tomorrow (2/17)   Chronic combined heart  failure Presented with shortness of breath with minimal activity Known EF of 35 to 40% No obvious volume overload clinically or on imaging at this time No longer taking home Lasix Hold Entresto in the setting of hyperK+   Atrial flutter EKG with persistent flutter that appeared to be Vtach Also with LBBB Monitor currently appears to show NSR Cardiology consulted and started PO amiodarone  Hyperkalemia Given calcium gluconate and Lokelma with resolution Entresto held on presentation, will defer to cardiology about when to resume   COPD  Recently completed Tamiflu for 5 days; positive test is unlikely to be active infection No evidence of decompensation from this standpoint currently He will continue to use albuterol as needed Continue Mucinex, Combivent Patient recently discharged with supplemental oxygen of 2 L with mobility   Essential hypertension  Continue carvedilol   Peripheral artery disease  Status post right femoral peroneal bypass in 2023 On heparin in lieu of home Xarelto Continue Plavix, statin   HLD Continue 80 mg atorvastatin daily   Depression and anxiety Continue buspirone, hydroxyzine, and trazodone   Marijuana use Counseled to quit smoking and find alternative method of use if needed Praise provided that he has stopped smoking cigarettes   Prediabetes Recent A1c was 6 Will start sensitive scale SSI   Peripheral neuropathy Military-related issues Uses THC for this issue No longer taking gabapentin   Prolonged QTc Will attempt to avoid QT-prolonging medications such as PPI, nausea meds, SSRIs   Gout Continue once daily allopurinol     Consultants: Cardiology  Procedures: Right/left heart catheterization 2/17  Antibiotics: Azithromycin 2/15-19 Ceftriaxone 2/15-21  30 Day Unplanned Readmission Risk Score  Flowsheet Row ED to Hosp-Admission (Current) from 10/16/2023 in [facility] 4E CV SURGICAL PROGRESSIVE CARE  30 Day Unplanned  Readmission Risk Score (%) 34.23 Filed at 10/17/2023 0401       This score is the patient's risk of an unplanned readmission within 30 days of being discharged (0 -100%). The score is based on dignosis, age, lab data, medications, orders, and past utilization.   Low:  0-14.9   Medium: 15-21.9   High: 22-29.9   Extreme: 30 and above           Subjective: He had some SOB and chest tightness this AM when he got up and moved around.  Currently feeling ok.   Objective: Vitals:   10/17/23 0437 10/17/23 0822  BP: 112/88 126/64  Pulse: 100 98  Resp: 17   Temp: 98.1 F (36.7 C)   SpO2: 96%     Intake/Output Summary (Last 24 hours) at 10/17/2023 1222 Last data filed at 10/17/2023 0430 Gross per 24 hour  Intake 340.95 ml  Output 400 ml  Net -59.05 ml   Filed Weights   10/16/23 0714  Weight: 68 kg    Exam:   General:  Appears less ill today Eyes:   EOMI, normal lids, iris ENT:  grossly normal hearing, lips & tongue, mmm; artificial upper/absent lower dentition Neck:  no LAD, masses or thyromegaly Cardiovascular:  RRR. No LE edema.  Respiratory:   CTA.  Normal respiratory effort on [place] O2 Abdomen:  soft, NT, ND Back:   normal alignment, no CVAT Skin:  no rash or induration seen on limited exam; diffuse seb-k on thorax Musculoskeletal:  grossly normal tone BUE/BLE, good ROM, no bony abnormality Psychiatric:  blunted mood and affect, speech fluent and appropriate, AOx3 Neurologic:  CN 2-12 grossly intact, moves all extremities in coordinated fashion  Data Reviewed: I have reviewed the patient's lab results since admission.  Pertinent labs for today include:   Normal BMP Troponin up to 78 -> 20,977 at last check and still increasing WBC 15.2 Hgb 8.4, 10.7 on presentation but 8.9 on 1/16     Family Communication: None present today; I attempted to call his son without succes  Disposition: Status is: Inpatient Remains inpatient appropriate because: ongoing  management     Time spent: 50 minutes  Unresulted Labs (From admission, onward)     Start     Ordered   10/18/23 0500  APTT  Daily,   R      10/16/23 1520   10/18/23 0500  Heparin level (unfractionated)  Daily,   R      10/16/23 1521   10/17/23 0500  CBC  Daily,   R      10/16/23 0815   10/16/23 1254  Expectorated Sputum Assessment w Gram Stain, Rflx to Resp Cult  (COPD / Pneumonia / Cellulitis / Lower Extremity Wound)  Once,   R        10/16/23 1256             Author: Jonah Blue, MD 10/17/2023 12:22 PM  For on call review [email or web address].

## 2023-10-17 NOTE — Plan of Care (Signed)

## 2023-10-17 NOTE — Progress Notes (Signed)
 Cardiology Progress Note  Patient ID: Richard Rivers MRN: [id number] DOB: 05/28/1946 Date of Encounter: 10/17/2023 Primary Cardiologist: Richard Rotunda, MD  Subjective   Chief Complaint: SOB  HPI: Reports some chest pain this morning.  Also short of breath.  Echo pending.  ROS:  All other ROS reviewed and negative. Pertinent positives noted in the HPI.     Telemetry  Overnight telemetry shows sinus rhythm 80s, which I personally reviewed.   ECG  The most recent ECG shows sinus tachycardia heart rate 105, old anteroseptal infarct, nonspecific ST-T changes, which I personally reviewed.   Physical Exam   Vitals:   10/16/23 1934 10/17/23 0029 10/17/23 0437 10/17/23 0822  BP: 121/76 127/61 112/88 126/64  Pulse: 96  100 98  Resp: 19 20 17    Temp: 97.7 F (36.5 C) 98.3 F (36.8 C) 98.1 F (36.7 C)   TempSrc: Oral Oral Oral   SpO2: 99% 96% 96%   Weight:      Height:        Intake/Output Summary (Last 24 hours) at 10/17/2023 1204 Last data filed at 10/17/2023 0430 Gross per 24 hour  Intake 340.95 ml  Output 400 ml  Net -59.05 ml       10/16/2023    7:14 AM 09/16/2023    7:00 AM 09/15/2023    5:28 AM  Last 3 Weights  Weight (lbs) 150 lb 144 lb 4.8 oz 144 lb 13.5 oz  Weight (kg) 68.04 kg 65.454 kg 65.7 kg    Body mass index is 20.34 kg/m.  General: Well nourished, well developed, in no acute distress Head: Atraumatic, normal size  Eyes: PEERLA, EOMI  Neck: Supple, no JVD Endocrine: No thryomegaly Cardiac: Normal S1, S2; RRR; no murmurs, rubs, or gallops Lungs: Diminished breath sounds bilaterally Abd: Soft, nontender, no hepatomegaly  Ext: Trace edema Musculoskeletal: No deformities, BUE and BLE strength normal and equal Skin: Warm and dry, no rashes   Neuro: Alert and oriented to person, place, time, and situation, CNII-XII grossly intact, no focal deficits  Psych: Normal mood and affect   Cardiac Studies  TTE 09/03/2023  1. Consider Infiltrative process.   2.  Pul HTN.   3. Left ventricular ejection fraction, by estimation, is 35 to 40%. The  left ventricle has moderately decreased function. The left ventricle  demonstrates global hypokinesis. The left ventricular internal cavity size  was mildly dilated. Left ventricular  diastolic parameters are consistent with Grade II diastolic dysfunction  (pseudonormalization).   4. Right ventricular systolic function is low normal. The right  ventricular size is mildly enlarged. Mildly increased right ventricular  wall thickness. There is severely elevated pulmonary artery systolic  pressure.   5. Left atrial size was mild to moderately dilated.   6. Right atrial size was mild to moderately dilated.   7. The mitral valve is grossly normal. Mild mitral valve regurgitation.   8. Tricuspid valve regurgitation is mild to moderate.   9. The aortic valve is grossly normal. Aortic valve regurgitation is  trivial.   Left heart cath 02/2022: Left main PCI, mid LAD 50%, OM1 60%, CTO RCA with collaterals   Patient Profile  Richard Rivers is a 78 y.o. male with CAD status post left main PCI in 2023, CTO of RCA, moderate LAD and OM disease, systolic heart failure and (35-40%), PAD status post left CEA and right femoropopliteal bypass, COPD, hypertension, hyperlipidemia who was admitted on 10/16/2023 for atrial fib with RVR and non-STEMI.  Assessment &  Plan   # Non-STEMI # Complex CAD history with PCI to left main and CTO of RCA in 2023 -Admitted with rapid atrial fibrillation.  Has suffered what appears to be a non-STEMI.  Could be demand in the setting of rapid A-fib.  Unclear.  However given complex CAD history I believe he does merit repeat coronary angiography. -Add aspirin 81 mg daily.  Continue Plavix 75 mg daily.  Continue heparin drip. -Continue carvedilol 12.5 mg twice daily. -Recent lipid profile within limits.  Continue Lipitor 80 mg daily.  Informed Consent   Shared Decision Making/Informed  Consent The risks [stroke (1 in 1000), death (1 in 1000), kidney failure [usually temporary] (1 in 500), bleeding (1 in 200), allergic reaction [possibly serious] (1 in 200)], benefits (diagnostic support and management of coronary artery disease) and alternatives of a cardiac catheterization were discussed in detail with Richard Rivers and he is willing to proceed.     # Persistent Afib -He has converted to sinus rhythm.  Rhythm control strategy will be pursued in the setting of systolic dysfunction. -Continue amiodarone 200 mg twice daily for 7 days followed by 200 mg daily. -On heparin drip. -Transition back to DOAC after cath.  # Rate dependent left bundle branch block -I believe this LBBB was related to rate.  Not VT.  # Acute on Chronic Systolic HF, EF 35-40% -repeat echo pending.  -lasix 40 mg IV x 1 today -Had issues with hyperkalemia on arrival.  Hold Entresto until this remained stable.  Kidney function is improving.  Hopefully will improve with diuresis as well. -Add back GDMT as we are able.  Hold SGLT2 inhibitor given need for procedures.  # Influenza A # Multilobar pneumonia # COPD -Antibiotics per primary team.  May need extended therapy with antibiotics.  Defer to hospital team. -Think it is okay to proceed with left heart catheterization.  He seems to be stable.  This could just be residual from his recent episode.  # Hyperkalemia -Improving.  We will likely rechallenge him on Entresto in the next 1 to 2 days.  Would avoid Aldactone.       For questions or updates, please contact [facility] Please consult [email or web address] for contact info under      Signed, Richard Spore T. Flora Lipps, MD, [facility]  [facility]  10/17/2023 12:04 PM

## 2023-10-17 NOTE — Progress Notes (Signed)
 PHARMACY - ANTICOAGULATION CONSULT NOTE  Pharmacy Consult for heparin Indication:  NSTEMI and PAD  Labs: Recent Labs    10/16/23 0653 10/16/23 0657 10/16/23 0701 10/16/23 0744 10/16/23 0836 10/16/23 1422 10/16/23 1647 10/17/23 0016  HGB 10.7* 12.6*  --  11.6*  --   --   --   --   HCT 35.4* 37.0*  --  34.0*  --   --   --   --   PLT 393  --   --   --   --   --   --   --   APTT  --   --  37*  --   --  51*  --  58*  LABPROT  --   --  25.5*  --   --   --   --   --   INR  --   --  2.3*  --   --   --   --   --   CREATININE 1.21 1.10  --  1.20  --  1.24  --   --   TROPONINIHS 78*  --   --   --  314* 12,243* 20,977*  --    Assessment: 77yo male subtherapeutic on heparin after rate change; no infusion issues or signs of bleeding per RN.  Goal of Therapy:  aPTT 66-102 seconds   Plan:  Increase heparin infusion by 2-3 units/kg/hr to 1200 units/hr. Check PTT in 8 hours.   Vernard Gambles, PharmD, BCPS 10/17/2023 1:04 AM

## 2023-10-18 ENCOUNTER — Encounter (HOSPITAL_COMMUNITY)
Admission: EM | Disposition: A | Discharge status: Home Health | Payer: Self-pay | Source: Home / Self Care | Attending: Internal Medicine

## 2023-10-18 DIAGNOSIS — I5021 Acute systolic (congestive) heart failure: Secondary | ICD-10-CM

## 2023-10-18 DIAGNOSIS — J189 Pneumonia, unspecified organism: Secondary | ICD-10-CM | POA: Diagnosis not present

## 2023-10-18 DIAGNOSIS — I214 Non-ST elevation (NSTEMI) myocardial infarction: Secondary | ICD-10-CM | POA: Diagnosis not present

## 2023-10-18 DIAGNOSIS — D649 Anemia, unspecified: Secondary | ICD-10-CM

## 2023-10-18 DIAGNOSIS — I4891 Unspecified atrial fibrillation: Secondary | ICD-10-CM | POA: Diagnosis not present

## 2023-10-18 LAB — BASIC METABOLIC PANEL
Anion gap: 7 (ref 5–15)
BUN: 30 mg/dL — ABNORMAL HIGH (ref 8–23)
CO2: 27 mmol/L (ref 22–32)
Calcium: 9 mg/dL (ref 8.9–10.3)
Chloride: 101 mmol/L (ref 98–111)
Creatinine, Ser: 1.46 mg/dL — ABNORMAL HIGH (ref 0.61–1.24)
GFR, Estimated: 49 mL/min — ABNORMAL LOW (ref >60)
Glucose, Bld: 81 mg/dL (ref 70–99)
Potassium: 4.7 mmol/L (ref 3.5–5.1)
Sodium: 135 mmol/L (ref 135–145)

## 2023-10-18 LAB — GLUCOSE, CAPILLARY
Glucose-Capillary: 82 mg/dL (ref 70–99)
Glucose-Capillary: 84 mg/dL (ref 70–99)
Glucose-Capillary: 110 mg/dL — ABNORMAL HIGH (ref 70–99)
Glucose-Capillary: 88 mg/dL (ref 70–99)

## 2023-10-18 LAB — PREPARE RBC (CROSSMATCH)

## 2023-10-18 LAB — HEMOGLOBIN AND HEMATOCRIT, BLOOD
HCT: 29.2 % — ABNORMAL LOW (ref 39.0–52.0)
Hemoglobin: 9.4 g/dL — ABNORMAL LOW (ref 13.0–17.0)

## 2023-10-18 LAB — CBC
HCT: 22.9 % — ABNORMAL LOW (ref 39.0–52.0)
Hemoglobin: 7.1 g/dL — ABNORMAL LOW (ref 13.0–17.0)
MCH: 27.8 pg (ref 26.0–34.0)
MCHC: 31 g/dL (ref 30.0–36.0)
MCV: 89.8 fL (ref 80.0–100.0)
Platelets: 227 10*3/uL (ref 150–400)
RBC: 2.55 MIL/uL — ABNORMAL LOW (ref 4.22–5.81)
RDW: 16.5 % — ABNORMAL HIGH (ref 11.5–15.5)
WBC: 9.6 10*3/uL (ref 4.0–10.5)
nRBC: 0 % (ref 0.0–0.2)

## 2023-10-18 LAB — HEPARIN LEVEL (UNFRACTIONATED): Heparin Unfractionated: 0.68 [IU]/mL (ref 0.30–0.70)

## 2023-10-18 LAB — APTT: aPTT: 92 s — ABNORMAL HIGH (ref 24–36)

## 2023-10-18 LAB — ABO/RH: ABO/RH(D): A POS

## 2023-10-18 SURGERY — RIGHT/LEFT HEART CATH AND CORONARY ANGIOGRAPHY
Anesthesia: LOCAL

## 2023-10-18 MED ORDER — SODIUM CHLORIDE 0.9% IV SOLUTION: Freq: Once | INTRAVENOUS | Status: AC

## 2023-10-18 MED ORDER — AZITHROMYCIN 500 MG PO TABS
500.0000 mg | ORAL_TABLET | Freq: Once | ORAL | Status: AC
  Administered 2023-10-18: 500 mg via ORAL
  Filled 2023-10-18: qty 1

## 2023-10-18 MED ORDER — FUROSEMIDE 10 MG/ML IJ SOLN
20.0000 mg | Freq: Once | INTRAMUSCULAR | Status: AC
  Administered 2023-10-18: 20 mg via INTRAVENOUS
  Filled 2023-10-18: qty 2

## 2023-10-18 MED ORDER — ENSURE ENLIVE PO LIQD
237.0000 mL | Freq: Two times a day (BID) | ORAL | Status: DC
  Administered 2023-10-19 – 2023-10-22 (×6): 237 mL via ORAL

## 2023-10-18 MED ORDER — ADULT MULTIVITAMIN W/MINERALS CH
1.0000 | ORAL_TABLET | Freq: Every day | ORAL | Status: DC
  Administered 2023-10-19 – 2023-10-22 (×4): 1 via ORAL
  Filled 2023-10-18 (×4): qty 1

## 2023-10-18 NOTE — Plan of Care (Signed)
  Problem: Health Behavior/Discharge Planning: Goal: Ability to identify and utilize available resources and services will improve Outcome: Progressing Goal: Ability to manage health-related needs will improve Outcome: Progressing   Problem: Fluid Volume: Goal: Ability to maintain a balanced intake and output will improve Outcome: Progressing

## 2023-10-18 NOTE — Progress Notes (Signed)
 Ok to stop azithromycin after 3 days for PNA due its long half life per Dr. Ophelia Charter.  Ulyses Southward, PharmD, BCIDP, AAHIVP, CPP Infectious Disease Pharmacist 10/18/2023 10:32 AM

## 2023-10-18 NOTE — Progress Notes (Signed)
 Cardiology Progress Note  Patient ID: GARRICK MIDGLEY MRN: [id number] DOB: 06/03/1946 Date of Encounter: 10/18/2023 Primary Cardiologist: Rollene Rotunda, MD  Subjective   Chief Complaint: Chest pain  HPI: Reports some chest pain this morning.  Hemoglobin is trending down.  Left heart catheterization canceled.  ROS:  All other ROS reviewed and negative. Pertinent positives noted in the HPI.     Telemetry  Overnight telemetry shows sinus rhythm 80s, which I personally reviewed.   ECG  The most recent ECG shows sinus tachycardia heart rate 105, anteroseptal infarct, which I personally reviewed.   Physical Exam   Vitals:   10/18/23 0403 10/18/23 0405 10/18/23 0642 10/18/23 0756  BP: (!) 99/46 (!) 97/58 130/68   Pulse:  80 97   Resp:  17    Temp:  98.8 F (37.1 C) 98 F (36.7 C) 97.9 F (36.6 C)  TempSrc:  Oral Oral Oral  SpO2:  100%    Weight:      Height:        Intake/Output Summary (Last 24 hours) at 10/18/2023 0931 Last data filed at 10/18/2023 0646 Gross per 24 hour  Intake 1294.18 ml  Output 600 ml  Net 694.18 ml       10/16/2023    7:14 AM 09/16/2023    7:00 AM 09/15/2023    5:28 AM  Last 3 Weights  Weight (lbs) 150 lb 144 lb 4.8 oz 144 lb 13.5 oz  Weight (kg) 68.04 kg 65.454 kg 65.7 kg    Body mass index is 20.34 kg/m.  General: Well nourished, well developed, in no acute distress Head: Atraumatic, normal size  Eyes: PEERLA, EOMI  Neck: Supple, no JVD Endocrine: No thryomegaly Cardiac: Normal S1, S2; RRR; no murmurs, rubs, or gallops Lungs: Diminished breath sounds bilaterally Abd: Soft, nontender, no hepatomegaly  Ext: No edema, pulses 2+ Musculoskeletal: No deformities, BUE and BLE strength normal and equal Skin: Warm and dry, no rashes   Neuro: Alert and oriented to person, place, time, and situation, CNII-XII grossly intact, no focal deficits  Psych: Normal mood and affect   Cardiac Studies  TTE 10/17/2023  1. Left ventricular ejection  fraction, by estimation, is 35 to 40%. The  left ventricle has moderately decreased function. The left ventricle  demonstrates global hypokinesis. Left ventricular diastolic parameters are  consistent with Grade I diastolic  dysfunction (impaired relaxation).   2. Right ventricular systolic function is normal. The right ventricular  size is normal. There is mildly elevated pulmonary artery systolic  pressure. The estimated right ventricular systolic pressure is 36.1 mmHg.   3. The mitral valve is grossly normal. Trivial mitral valve  regurgitation. No evidence of mitral stenosis.   4. The aortic valve is tricuspid. Aortic valve regurgitation is not  visualized. No aortic stenosis is present.   5. The inferior vena cava is dilated in size with >50% respiratory  variability, suggesting right atrial pressure of 8 mmHg.   Left heart cath 02/2022: Left main PCI, mid LAD 50%, OM1 60%, CTO RCA with collaterals   Patient Profile  Richard Rivers is a 78 y.o. male with CAD status post left main PCI in 2023, CTO of RCA, moderate LAD and OM disease, systolic heart failure and (35-40%), PAD status post left CEA and right femoropopliteal bypass, COPD, hypertension, hyperlipidemia who was admitted on 10/16/2023 for atrial fib with RVR and non-STEMI.   Assessment & Plan   # NSTEMI  vs Demand ischemia # Complex CAD history of  PCI to left main and CTO of RCA in 2023 -Admitted with influenza multifocal pneumonia and rapid A-fib. -Troponin elevation was impressive and is trending down.  He has numerous reasons to have troponin elevation in the setting of rapid A-fib. -Either non-STEMI or demand ischemia.  I favor demand as his LVEF is largely unchanged at 35 to 40%.  There is no new wall motion abnormality. -Course now complicated by anemia.  Currently on aspirin Plavix and heparin drip. -Hemoglobin is trending down to 7.1.  We are going to stop antiplatelet and anticoagulation. -Currently not a candidate  for invasive angiography with anemia and rising serum creatinine. -We will ask the hospital team to evaluate his anemia. -In the interim okay to continue statin therapy.  Does not appear that volume up.  Continue with treatment for pneumonia. -continue imdur for CP. NTG PRN.    # Acute systolic heart failure, EF 35 to 40% # Ischemic cardiomyopathy -Currently admitted with multifocal pneumonia and fluid.  Had rapid A-fib and non-STEMI versus demand ischemia. -EF largely unchanged.  This fits more of a demand pattern. -Also now with AKI and hyperkalemia.  Hyperkalemia is improving. -Hold Entresto for now.  Can restart once his kidney function improves. -May not be a good candidate for Aldactone moving forward.  We will see what GDMT we can get him on. -Would hold SGLT2 inhibitor as he does have a need for possible cath. -Okay to continue beta-blocker. -Does not appear grossly volume overloaded.  Can hold aggressive diuresis.  # Persistent A-fib -Now back in sinus rhythm.  Given systolic dysfunction rhythm control strategy will be pursued. -Continue amiodarone 200 mg twice daily for 7 days followed by 200 mg daily. -Anticoagulation currently on hold due to anemia.  # Influenza A # Multifocal pneumonia # COPD -Per hospital medicine team.  Clearly complicates his CV care.  # Anemia -Holding aspirin Plavix and heparin drip.  I have ordered fecal occult blood testing.  Further workup of anemia per hospital team. -Downtrending hemoglobin will be a contraindication to invasive coronary angiography. -Would recommend to transfuse for hemoglobin of 8.0 given concerns for non-STEMI versus demand ischemia.  # AKI -Does not appear that volume overloaded.  Was given Lasix yesterday.  Could be multifactorial in the setting of CHF and anemia. -Close monitoring of renal output.  # Hyperkalemia -Admitted with hyperkalemia which is problematic.  We are holding his home Entresto.  Not a candidate for  Aldactone with elevated K (still 4.7)   For questions or updates, please contact [facility] Please consult [email or web address] for contact info under      Signed, Gerri Spore T. Flora Lipps, MD, [facility]  [facility]  10/18/2023 9:31 AM

## 2023-10-18 NOTE — TOC Initial Note (Signed)
 Transition of Care [facility]) - Initial/Assessment Note    Patient Details  Name: Richard Rivers MRN: [id number] Date of Birth: [birth year]  Transition of Care [facility]) CM/SW Contact:    Ronny Bacon, RN Phone Number: 10/18/2023, 1:15 PM  Clinical Narrative:   Patient from home with family. Patient currently on 2L oxygen via [place]. Hgb 7.1, heart cath cancelled.                 Expected Discharge Plan: Home w Home Health Services Barriers to Discharge: Continued Medical Work up   Patient Goals and CMS Choice            Expected Discharge Plan and Services       Living arrangements for the past 2 months: Single Family Home                                      Prior Living Arrangements/Services Living arrangements for the past 2 months: Single Family Home Lives with:: Adult Children Patient language and need for interpreter reviewed:: Yes          Care giver support system in place?: Yes (comment)   Criminal Activity/Legal Involvement Pertinent to Current Situation/Hospitalization: No - Comment as needed  Activities of Daily Living   ADL Screening (condition at time of admission) Independently performs ADLs?: Yes (appropriate for developmental age) Is the patient deaf or have difficulty hearing?: No Does the patient have difficulty seeing, even when wearing glasses/contacts?: No Does the patient have difficulty concentrating, remembering, or making decisions?: No  Permission Sought/Granted                  Emotional Assessment         Alcohol / Substance Use: Not Applicable Psych Involvement: No (comment)  Admission diagnosis:  Hyperkalemia [E87.5] Respiratory distress [R06.03] Left lower lobe pneumonia [J18.9] Typical atrial flutter (HCC) [I48.3] Pneumonia of left lower lobe due to infectious organism [J18.9] Sepsis, due to unspecified organism, unspecified whether acute organ dysfunction present [facility]) [A41.9] Patient Active Problem List    Diagnosis Date Noted   Multifocal pneumonia 10/16/2023   Atrial flutter (HCC) 10/16/2023   COPD (chronic obstructive pulmonary disease) (HCC) 10/16/2023   Marijuana use 10/16/2023   Prolonged QT interval 10/16/2023   Hyperkalemia 10/16/2023   Malnutrition of moderate degree 09/14/2023   NSTEMI (non-ST elevated myocardial infarction) (HCC) 09/12/2023   HFrEF (heart failure with reduced ejection fraction) (HCC) 09/03/2023   Pulmonary hypertension (HCC) 09/03/2023   PAD (peripheral artery disease) (HCC) 09/02/2023   Neuropathy 09/02/2023   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, level unspecified, initial encounter (HCC) 09/02/2023   COPD with acute exacerbation (HCC) 09/02/2023   Depression with anxiety 09/01/2023   Pleural effusion 09/01/2023   Myocardial injury 09/01/2023   Hypertension    HLD (hyperlipidemia)    CAD (coronary artery disease)    Gout    Special screening for malignant neoplasms, colon    Benign neoplasm of appendix    Benign neoplasm of sigmoid colon    Benign neoplasm of ascending colon    Benign neoplasm of transverse colon    Fracture of neck of femur (HCC) 02/01/2015   PCP:  System, Provider Not In Pharmacy:   [id number] - [place], [place] - [address] AT [facility] [address] [place] [address] Phone: [phone]  Fax: [phone]  [facility] - [place], [place] - [address] [place] [address] Phone: [phone] Fax: [phone]     Social Drivers of Health (SDOH) Social History: SDOH Screenings   Food Insecurity: No Food Insecurity (10/16/2023)  Housing: Low Risk  (10/16/2023)  Transportation Needs: No Transportation Needs (10/16/2023)  Utilities: Not At Risk (10/16/2023)  Alcohol Screen: Low Risk  (09/13/2023)  Financial Resource Strain: Low Risk  (09/13/2023)  Social Connections: Socially Isolated (10/16/2023)  Tobacco Use: High Risk (10/16/2023)    SDOH Interventions:     Readmission Risk Interventions     No data to display

## 2023-10-18 NOTE — Progress Notes (Signed)
 PHARMACY - ANTICOAGULATION CONSULT NOTE  Pharmacy Consult for IV heparin Indication:  NSTEMI and PAD   Allergies  Allergen Reactions   Codeine Itching and Swelling    Throat swelling   Penicillin G Swelling    Throat    Patient Measurements: Height: 6' (182.9 cm) Weight: 68 kg (150 lb) IBW/kg (Calculated) : 77.6 Heparin Dosing Weight: 68 kg  Vital Signs: Temp: 97.9 F (36.6 C) (02/17 0756) Temp Source: Oral (02/17 0756) BP: 130/68 (02/17 0642) Pulse Rate: 97 (02/17 0642)  Labs: Recent Labs    10/16/23 0653 10/16/23 0657 10/16/23 0701 10/16/23 0744 10/16/23 0836 10/16/23 1422 10/16/23 1647 10/17/23 0016 10/17/23 0302 10/17/23 0858 10/17/23 1053 10/18/23 0302  HGB 10.7*   < >  --  11.6*  --   --   --   --  8.4*  --   --  7.1*  HCT 35.4*   < >  --  34.0*  --   --   --   --  27.4*  --   --  22.9*  PLT 393  --   --   --   --   --   --   --  270  --   --  227  APTT  --    < > 37*  --   --  51*  --  58*  --  71*  --  92*  LABPROT  --   --  25.5*  --   --   --   --   --   --   --   --   --   INR  --   --  2.3*  --   --   --   --   --   --   --   --   --   HEPARINUNFRC  --   --   --   --   --   --   --   --   --   --  1.04* 0.68  CREATININE 1.21   < >  --  1.20  --  1.24  --   --  1.23  --   --  1.46*  TROPONINIHS 78*  --   --   --    < > 12,243* 20,977*  --   --  12,999*  --   --    < > = values in this interval not displayed.    Estimated Creatinine Clearance: 40.1 mL/min (A) (by C-G formula based on SCr of 1.46 mg/dL (H)).   Assessment: Richard Rivers is a 78 y.o. year old male admitted on 10/16/2023 with concern for ACS. Pt had a planned outpatient cardiac cath on 3/3. Xarelto prior to admission for PAD (last dose 2/14 PM). Pharmacy consulted to dose heparin.  PTT and heparin level are therapeutic this AM. They are correlating. Plan for cath.  Hgb 7.1, plt wnl  Goal of Therapy:  Heparin level: 0.3-0.7 Monitor platelets by anticoagulation protocol: Yes    Plan:  Continue heparin at 1,200 units/hr Daily heparin level and CBC F/u post cath  Ulyses Southward, PharmD, BCIDP, AAHIVP, CPP Infectious Disease Pharmacist 10/18/2023 9:21 AM

## 2023-10-18 NOTE — Plan of Care (Signed)
  Problem: Education: Goal: Ability to describe self-care measures that may prevent or decrease complications (Diabetes Survival Skills Education) will improve Outcome: Progressing Goal: Individualized Educational Video(s) Outcome: Progressing   Problem: Coping: Goal: Ability to adjust to condition or change in health will improve Outcome: Progressing   Problem: Fluid Volume: Goal: Ability to maintain a balanced intake and output will improve Outcome: Progressing   Problem: Health Behavior/Discharge Planning: Goal: Ability to identify and utilize available resources and services will improve Outcome: Progressing Goal: Ability to manage health-related needs will improve Outcome: Progressing   Problem: Metabolic: Goal: Ability to maintain appropriate glucose levels will improve Outcome: Progressing   Problem: Nutritional: Goal: Maintenance of adequate nutrition will improve Outcome: Progressing Goal: Progress toward achieving an optimal weight will improve Outcome: Progressing   Problem: Skin Integrity: Goal: Risk for impaired skin integrity will decrease Outcome: Progressing   Problem: Tissue Perfusion: Goal: Adequacy of tissue perfusion will improve Outcome: Progressing   Problem: Education: Goal: Knowledge of General Education information will improve Description: Including pain rating scale, medication(s)/side effects and non-pharmacologic comfort measures Outcome: Progressing   Problem: Health Behavior/Discharge Planning: Goal: Ability to manage health-related needs will improve Outcome: Progressing   Problem: Clinical Measurements: Goal: Ability to maintain clinical measurements within normal limits will improve Outcome: Progressing Goal: Will remain free from infection Outcome: Progressing Goal: Diagnostic test results will improve Outcome: Progressing Goal: Respiratory complications will improve Outcome: Progressing Goal: Cardiovascular complication will  be avoided Outcome: Progressing   Problem: Nutrition: Goal: Adequate nutrition will be maintained Outcome: Progressing   Problem: Coping: Goal: Level of anxiety will decrease Outcome: Progressing   Problem: Elimination: Goal: Will not experience complications related to bowel motility Outcome: Progressing Goal: Will not experience complications related to urinary retention Outcome: Progressing   Problem: Pain Managment: Goal: General experience of comfort will improve and/or be controlled Outcome: Progressing   Problem: Safety: Goal: Ability to remain free from injury will improve Outcome: Progressing   Problem: Skin Integrity: Goal: Risk for impaired skin integrity will decrease Outcome: Progressing   Problem: Activity: Goal: Ability to tolerate increased activity will improve Outcome: Progressing   Problem: Clinical Measurements: Goal: Ability to maintain a body temperature in the normal range will improve Outcome: Progressing   Problem: Respiratory: Goal: Ability to maintain adequate ventilation will improve Outcome: Progressing Goal: Ability to maintain a clear airway will improve Outcome: Progressing   Problem: Education: Goal: Understanding of CV disease, CV risk reduction, and recovery process will improve Outcome: Progressing Goal: Individualized Educational Video(s) Outcome: Progressing   Problem: Activity: Goal: Ability to return to baseline activity level will improve Outcome: Progressing   Problem: Cardiovascular: Goal: Ability to achieve and maintain adequate cardiovascular perfusion will improve Outcome: Progressing Goal: Vascular access site(s) Level 0-1 will be maintained Outcome: Progressing   Problem: Health Behavior/Discharge Planning: Goal: Ability to safely manage health-related needs after discharge will improve Outcome: Progressing

## 2023-10-18 NOTE — Progress Notes (Signed)
 Pt complains of chest pain .  nitroglycerin given as  ordered  10/18/23 0642  Vitals  Temp 98 F (36.7 C)  Temp Source Oral  BP 130/68  MAP (mmHg) 87  BP Location Right Arm  BP Method Automatic  Patient Position (if appropriate) Lying  Pulse Rate 97  Pulse Rate Source Monitor  Level of Consciousness  Level of Consciousness Alert  Oxygen Therapy  O2 Device Nasal Cannula  O2 Flow Rate (L/min) 2 L/min  Pain Assessment  Pain Scale 0-10  Pain Score 7  Pain Type Acute pain  Pain Location Chest  Pain Orientation Left  Pain Descriptors / Indicators Dull  Pain Intervention(s) Medication (See eMAR)

## 2023-10-18 NOTE — Progress Notes (Signed)
 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 He is scheduled for cath at the [place] on 3/3. He was diagnosed with PNA and is being treated with Ceftriaxone and Azithromycin.  Once again, he has an NSTEMI with troponin elevation up to [address]00+ and is on heparin drip.  He is likely to need cardiac catheterization prior to dc, once medically stable.  Chest CT is pending.  Echo also pending.  Assessment and Plan:  Multilobar PNA Patient presenting with nonproductive cough, chest discomfort, and multifocal infiltrates on chest x-ray and subsequent chest CT This appears to be most likely community-acquired pneumonia; he was previously positive for influenza and is still testing positive, but this is a lesser consideration Blood/sputum cultures are pending Admitted to progressive care Continue Ceftriaxone x 7 days/completed IV Azithromycin x 3 days Added albuterol PRN Will continue Mucinex for cough   Non-ST elevation MI  Patient declined to have cardiac cath during prior hospitalization and also recommended medical management He has known disease and significantly elevated troponin and is very likely to need cardiac catheterization prior to dc Will continue heparin for now Limited echocardiogram similar to one from 09/03/2023.  EF 35 to 40%.  Repeat without WMA. On Plavix and Xarelto at home; transitioned to heparin here but now holding this per cardiology given worsening anemia Continue Imdur, carvedilol Cardiology is consulting and planning to do LHC and RHC once medically stable (planned for today but  this was delayed due to worsening anemia)   Anemia Hgb PTA was 8.9, likely overestimated no admission at 10.7 However, it has continued to drop since, 8.4 on 2/16 and 7.1 today Hgb goal given his cardiac situation is at least 8, 10 if active CP Needs transfusion, agreed, ordered 2 units PRBC with slow infusion and repeat CBC post-transfusion and in AM (Lasix between units) If ongoing drift, needs GI consult for evaluation - although no report of dark/bloody stools (no stools since admission) Reports colonoscopy 2-3 years ago with instructions to have another in 1 year but this is not visible (to me) in CareEverywhere  Chronic combined heart failure Presented with shortness of breath with minimal activity Known EF of 35 to 40% No obvious volume overload clinically or on imaging at this time No longer taking home Lasix Hold Entresto in the setting of hyperK+, worsening renal function Will give Lasix between blood units   Atrial flutter EKG with persistent flutter that appeared to be Vtach Also with LBBB Monitor currently appears to show NSR Cardiology consulted and started PO amiodarone   Hyperkalemia Given calcium gluconate and Lokelma with resolution Entresto held on presentation, will defer to cardiology about when to resume   COPD  Recently completed Tamiflu for 5 days; positive test is unlikely to be active infection No evidence of decompensation from this standpoint currently He will continue to use albuterol as needed Continue Mucinex, Combivent Patient recently discharged with supplemental oxygen of 2 L with mobility   Essential hypertension  Continue carvedilol   Peripheral artery disease  Status post right femoral peroneal bypass in 2023 On Plavix, Heparin (in lieu  of Xarelto) but these are on hold Continue statin  Stage 3a CKD Appears to be stable at this time - while worse than yesterday, he appears to have stage 3a CKD at baseline Attempt to avoid nephrotoxic  medications Recheck BMP in AM    HLD Continue 80 mg atorvastatin daily   Depression and anxiety Continue buspirone, hydroxyzine, and trazodone   Marijuana use Counseled to quit smoking and find alternative method of use if needed Praise provided that he has stopped smoking cigarettes   Prediabetes Recent A1c was 6 Will start sensitive scale SSI   Peripheral neuropathy Military-related issues Uses THC for this issue No longer taking gabapentin   Prolonged QTc Will attempt to avoid QT-prolonging medications such as PPI, nausea meds, SSRIs   Gout Continue once daily allopurinol       Consultants: Cardiology   Procedures: Right/left heart catheterization 2/17   Antibiotics: Azithromycin 2/15-19 Ceftriaxone 2/15-21  30 Day Unplanned Readmission Risk Score    Flowsheet Row ED to Hosp-Admission (Current) from 10/16/2023 in [facility] 4E CV SURGICAL PROGRESSIVE CARE  30 Day Unplanned Readmission Risk Score (%) 40.39 Filed at 10/18/2023 0401       This score is the patient's risk of an unplanned readmission within 30 days of being discharged (0 -100%). The score is based on dignosis, age, lab data, medications, orders, and past utilization.   Low:  0-14.9   Medium: 15-21.9   High: 22-29.9   Extreme: 30 and above           Subjective: Feeling ok.  Breathing is not bad, chest pain is mild.  No reported dark, tarry or frankly bloody stools.  Understands need to defer cath.  Agrees to blood products.   Objective: Vitals:   10/18/23 1100 10/18/23 1125  BP: (!) 110/55 (!) 99/50  Pulse: 72 65  Resp: 18 20  Temp: 98.4 F (36.9 C) 98.5 F (36.9 C)  SpO2: 98% 99%    Intake/Output Summary (Last 24 hours) at 10/18/2023 1332 Last data filed at 10/18/2023 1115 Gross per 24 hour  Intake 1294.18 ml  Output 600 ml  Net 694.18 ml   Filed Weights   10/16/23 0714  Weight: 68 kg    Exam:  General:  Appears less ill today, sitting up at the bedside visiting with his  son Eyes:   EOMI, normal lids, iris ENT:  grossly normal hearing, lips & tongue, mmm; artificial upper/absent lower dentition Neck:  no LAD, masses or thyromegaly Cardiovascular:  RRR. No LE edema.  Respiratory:   CTA.  Normal respiratory effort on [place] O2 Abdomen:  soft, NT, ND Skin:  no rash or induration seen on limited exam; diffuse seb-k on thorax Musculoskeletal:  grossly normal tone BUE/BLE, good ROM, no bony abnormality Psychiatric:  blunted mood and affect, speech fluent and appropriate, AOx3 Neurologic:  CN 2-12 grossly intact, moves all extremities in coordinated fashion  Data Reviewed: I have reviewed the patient's lab results since admission.  Pertinent labs for today include:   BUN 30/Creatinine 1.46/GFR 49 WBC 9.6 Hgb 7.1, down from 8.4     Family Communication: Son was present throughout evaluation  Disposition: Status is: Inpatient Remains inpatient appropriate because: ongoing management     Time spent: 50 minutes  Unresulted Labs (From admission, onward)     Start     Ordered   10/19/23 0500  CBC with Differential/Platelet  Daily,   R     Question:  Specimen collection method  Answer:  Lab=Lab collect   10/18/23 1331   10/19/23 0500  Basic metabolic panel  Daily,   R     Question:  Specimen collection method  Answer:  Lab=Lab collect   10/18/23 1331   10/18/23 0937  Occult blood card to lab, stool  Once,   R        10/18/23 0936   10/16/23 1254  Expectorated Sputum Assessment w Gram Stain, Rflx to Resp Cult  (COPD / Pneumonia / Cellulitis / Lower Extremity Wound)  Once,   R        10/16/23 1256             Author: Jonah Blue, MD 10/18/2023 1:32 PM  For on call review [email or web address].

## 2023-10-18 NOTE — Progress Notes (Signed)
 Initial Nutrition Assessment  DOCUMENTATION CODES:   Severe malnutrition in context of acute illness/injury  INTERVENTION:  Ensure Enlive po BID, each supplement provides 350 kcal and 20 grams of protein. Magic cup TID with meals, each supplement provides 290 kcal and 9 grams of protein  Liberalize diet to heart healthy to encourage intake MVI w/ minerals  NUTRITION DIAGNOSIS:  Severe Malnutrition related to acute illness as evidenced by moderate fat depletion, moderate muscle depletion.  GOAL:  Patient will meet greater than or equal to 90% of their needs  MONITOR:  PO intake, Supplement acceptance  REASON FOR ASSESSMENT:  Consult Assessment of nutrition requirement/status  ASSESSMENT:   Pt is a 78 y.o. male presenting with chest pain and atrial flutter. Found to have LLL PNA. PMH: CAD, HTN, HLD, COPD, CHF. Previously admitted 1/12-1/16 with Flu A, COPD, CHF exacerbations and NSTEMI (at which time he declined catheterization).   2/15 admitted w/ chest pain 2/17 LHC cancelled, transfusion  No intake documented, however patient endorses better appetite today. Lunch tray at bedside with approximately 50% consumed. He is amicable to chocolate Ensure while admitted, as he states those were started during last admission. Will add Magic Cup and liberalize diet to heart healthy as well to encourage intake. Reports decreased appetite since admission on Friday, 2/14, but adequate prior to that. No problems chewing or swallowing reported. States he does not wear his bottom dentures, as they are ill-fitting. No recent N/V/C/D.   24 Hour Recall B: scrambled eggs, toast, sausage L: skips and snacks instead D: chicken or fish or beef w/ a rice or potatoes Snacks: chips or Oreo cookies   Admit Weight: 68kg  He endorses UBW as between 160-165lbs and weighed this much over six months ago. This is a 6.2% wt loss, and not considered significant. Per chart review, patient weight one year ago  74.8kg, which constitutes 9% wt loss over last year and is also not considered significant. He is with a malnutrition dx AEB his degree of muscle and fat wasting.   Does not appear to be volume overloaded. IV Lasix administered 2/16. BUN and Crt trending up. Troponin did finally peak at 20,977 and now trending down. Required blood transfusion today.    Meds: docusate sodium, famotidine, furosemide, SSI, Lokelma  Labs: Na+ 133>135 (wdl) K+ 4.7 (wdl) CBGs 81-94 over 48 hours BUN 30 (H) Crt 1.46 (H)    NUTRITION - FOCUSED PHYSICAL EXAM: Flowsheet Row Most Recent Value  Orbital Region Mild depletion  Upper Arm Region Moderate depletion  Thoracic and Lumbar Region Moderate depletion  Buccal Region Mild depletion  Temple Region Moderate depletion  Clavicle Bone Region Moderate depletion  Clavicle and Acromion Bone Region Moderate depletion  Scapular Bone Region Moderate depletion  Dorsal Hand Moderate depletion  Patellar Region Moderate depletion  Anterior Thigh Region Moderate depletion  Posterior Calf Region Moderate depletion  Edema (RD Assessment) None  [Some RLE edema at baseline]  Hair Reviewed  Eyes Reviewed  Mouth Reviewed  [has dentures, wears top dentures, but not bottoms - says bottom dentures are ill-fitting]  Skin Reviewed  Nails Reviewed      Diet Order:   Diet Order             Diet heart healthy/carb modified Room service appropriate? Yes; Fluid consistency: Thin  Diet effective now             EDUCATION NEEDS:   Education needs have been addressed  Skin:  Skin Assessment: Reviewed RN  Assessment  Last BM:  2/13 - PTA  Height:  Ht Readings from Last 1 Encounters:  10/16/23 6' (1.829 m)   Weight:  Wt Readings from Last 1 Encounters:  10/16/23 68 kg    Ideal Body Weight:  80.9 kg  BMI:  Body mass index is 20.34 kg/m.  Estimated Nutritional Needs:   Kcal:  1800-2000kcal  Protein:  80-90g  Fluid:  >1.8L/day  Myrtie Cruise MS, RD,  LDN Registered Dietitian Clinical Nutrition RD Inpatient Contact Info in Amion

## 2023-10-18 NOTE — Progress Notes (Signed)
 Blood tinged sputum noted on assessment along with  mild nose bleed. Moisture added to oxygen for dry nasal mucosa.

## 2023-10-19 DIAGNOSIS — I5021 Acute systolic (congestive) heart failure: Secondary | ICD-10-CM | POA: Diagnosis not present

## 2023-10-19 DIAGNOSIS — E43 Unspecified severe protein-calorie malnutrition: Secondary | ICD-10-CM | POA: Insufficient documentation

## 2023-10-19 DIAGNOSIS — I4891 Unspecified atrial fibrillation: Secondary | ICD-10-CM | POA: Diagnosis not present

## 2023-10-19 DIAGNOSIS — J189 Pneumonia, unspecified organism: Secondary | ICD-10-CM | POA: Diagnosis not present

## 2023-10-19 DIAGNOSIS — I214 Non-ST elevation (NSTEMI) myocardial infarction: Secondary | ICD-10-CM | POA: Diagnosis not present

## 2023-10-19 LAB — CBC WITH DIFFERENTIAL/PLATELET
Abs Immature Granulocytes: 0.03 10*3/uL (ref 0.00–0.07)
Basophils Absolute: 0.1 10*3/uL (ref 0.0–0.1)
Basophils Relative: 1 %
Eosinophils Absolute: 0.1 10*3/uL (ref 0.0–0.5)
Eosinophils Relative: 2 %
HCT: 30.8 % — ABNORMAL LOW (ref 39.0–52.0)
Hemoglobin: 9.8 g/dL — ABNORMAL LOW (ref 13.0–17.0)
Immature Granulocytes: 0 %
Lymphocytes Relative: 15 %
Lymphs Abs: 1.4 10*3/uL (ref 0.7–4.0)
MCH: 28.7 pg (ref 26.0–34.0)
MCHC: 31.8 g/dL (ref 30.0–36.0)
MCV: 90.1 fL (ref 80.0–100.0)
Monocytes Absolute: 1.2 10*3/uL — ABNORMAL HIGH (ref 0.1–1.0)
Monocytes Relative: 12 %
Neutro Abs: 6.6 10*3/uL (ref 1.7–7.7)
Neutrophils Relative %: 70 %
Platelets: 204 10*3/uL (ref 150–400)
RBC: 3.42 MIL/uL — ABNORMAL LOW (ref 4.22–5.81)
RDW: 17 % — ABNORMAL HIGH (ref 11.5–15.5)
WBC: 9.4 10*3/uL (ref 4.0–10.5)
nRBC: 0 % (ref 0.0–0.2)

## 2023-10-19 LAB — BPAM RBC
Blood Product Expiration Date: 202503082359
Blood Product Expiration Date: 202503112359
ISSUE DATE / TIME: 202502171110
ISSUE DATE / TIME: 202502171444
Unit Type and Rh: 6200
Unit Type and Rh: 6200

## 2023-10-19 LAB — BASIC METABOLIC PANEL
Anion gap: 10 (ref 5–15)
BUN: 28 mg/dL — ABNORMAL HIGH (ref 8–23)
CO2: 26 mmol/L (ref 22–32)
Calcium: 9.2 mg/dL (ref 8.9–10.3)
Chloride: 102 mmol/L (ref 98–111)
Creatinine, Ser: 1.28 mg/dL — ABNORMAL HIGH (ref 0.61–1.24)
GFR, Estimated: 57 mL/min — ABNORMAL LOW (ref >60)
Glucose, Bld: 76 mg/dL (ref 70–99)
Potassium: 4.3 mmol/L (ref 3.5–5.1)
Sodium: 138 mmol/L (ref 135–145)

## 2023-10-19 LAB — TYPE AND SCREEN
ABO/RH(D): A POS
Antibody Screen: NEGATIVE
Unit division: 0
Unit division: 0

## 2023-10-19 LAB — HEPARIN LEVEL (UNFRACTIONATED): Heparin Unfractionated: 0.22 [IU]/mL — ABNORMAL LOW (ref 0.30–0.70)

## 2023-10-19 LAB — GLUCOSE, CAPILLARY
Glucose-Capillary: 82 mg/dL (ref 70–99)
Glucose-Capillary: 132 mg/dL — ABNORMAL HIGH (ref 70–99)
Glucose-Capillary: 144 mg/dL — ABNORMAL HIGH (ref 70–99)
Glucose-Capillary: 123 mg/dL — ABNORMAL HIGH (ref 70–99)

## 2023-10-19 LAB — APTT: aPTT: 45 s — ABNORMAL HIGH (ref 24–36)

## 2023-10-19 MED ORDER — CLOPIDOGREL BISULFATE 75 MG PO TABS
75.0000 mg | ORAL_TABLET | Freq: Every day | ORAL | Status: DC
  Administered 2023-10-19 – 2023-10-22 (×4): 75 mg via ORAL
  Filled 2023-10-19 (×4): qty 1

## 2023-10-19 MED ORDER — SALINE SPRAY 0.65 % NA SOLN: 1.0000 | NASAL | Status: DC | PRN

## 2023-10-19 MED ORDER — SACUBITRIL-VALSARTAN 24-26 MG PO TABS
1.0000 | ORAL_TABLET | Freq: Two times a day (BID) | ORAL | Status: DC
  Administered 2023-10-19 – 2023-10-22 (×7): 1 via ORAL
  Filled 2023-10-19 (×7): qty 1

## 2023-10-19 MED ORDER — HEPARIN (PORCINE) 25000 UT/250ML-% IV SOLN
1300.0000 [IU]/h | INTRAVENOUS | Status: DC
  Administered 2023-10-19: 1100 [IU]/h via INTRAVENOUS
  Filled 2023-10-19 (×2): qty 250

## 2023-10-19 NOTE — Progress Notes (Signed)
 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 He is scheduled for cath at the [place] on 3/3. He was diagnosed with PNA and is being treated with Ceftriaxone and Azithromycin.  Once again, he has an NSTEMI with troponin elevation up to [address]00+ and is on heparin drip.  He is likely to need cardiac catheterization prior to dc, once medically stable.  Hgb fell to 7.1 on 2/17 and he was transfused 2 units PRBC.  Assessment and Plan:  Multilobar PNA Patient presenting with nonproductive cough, chest discomfort, and multifocal infiltrates on chest x-ray and subsequent chest CT This appears to be most likely community-acquired pneumonia; he was previously positive for influenza and is still testing positive, but this is a lesser consideration Blood/sputum cultures are pending Admitted to progressive care Continue Ceftriaxone x 7 days/completed IV Azithromycin x 3 days Added albuterol PRN Will continue Mucinex for cough   Non-ST elevation MI  Patient declined to have cardiac cath during prior hospitalization and also recommended medical management He has known disease and significantly elevated troponin and is very likely to need cardiac catheterization prior to dc Will continue heparin for now Limited echocardiogram similar to one from 09/03/2023.  EF 35 to 40%.  Repeat without WMA. On Plavix and Xarelto at home; transitioned to heparin here but now holding this per cardiology given worsening anemia Continue Imdur, carvedilol Cardiology is consulting and planning to do LHC and RHC once medically stable -  possibly 2/19 if he maintains stable Hgb upon resuming AC   Anemia Hgb PTA was 8.9, likely overestimated no admission at 10.7 However, it has continued to drop since, 8.4 on 2/16 and 7.1 today Hgb goal given his cardiac situation is at least 8, 10 if active CP Transfused 2 units PRBC with appropriate compensation If ongoing drift, needs GI consult for evaluation - although no report of dark/bloody stools (no stools since admission) Reports colonoscopy 2-3 years ago with instructions to have another in 1 year but this is not visible (to me) in CareEverywhere Appears to be stable today, recheck in AM   Chronic combined heart failure Presented with shortness of breath with minimal activity Known EF of 35 to 40% No obvious volume overload clinically or on imaging at this time No longer taking home Lasix Hold Entresto in the setting of hyperK+, worsening renal function   Atrial flutter EKG with persistent flutter that appeared to be Vtach Also with LBBB Monitor currently appears to show NSR Cardiology consulted and started PO amiodarone   Hyperkalemia Given calcium gluconate and Lokelma with resolution Entresto held on presentation, will defer to cardiology about when to resume   COPD  Recently completed Tamiflu for 5 days; positive test is unlikely to be active infection No evidence of decompensation from this standpoint currently He will continue to use albuterol as needed Continue Mucinex, Combivent Patient recently discharged with supplemental oxygen of 2 L with mobility   Essential hypertension  Continue carvedilol   Peripheral artery disease  Status post right femoral peroneal bypass in 2023 On Plavix, Heparin (in lieu of Xarelto) but these are  on hold Continue statin   Stage 3a CKD Appears to be stable at this time - while worse than yesterday, he appears to have stage 3a CKD at baseline Attempt to avoid nephrotoxic medications Recheck BMP in AM    HLD Continue 80  mg atorvastatin daily   Depression and anxiety Continue buspirone, hydroxyzine, and trazodone   Marijuana use Counseled to quit smoking and find alternative method of use if needed Praise provided that he has stopped smoking cigarettes   Prediabetes Recent A1c was 6 Will start sensitive scale SSI   Peripheral neuropathy Military-related issues Uses THC for this issue No longer taking gabapentin   Prolonged QTc Will attempt to avoid QT-prolonging medications such as PPI, nausea meds, SSRIs   Gout Continue once daily allopurinol  Severe malnutrition Nutrition Problem: Severe Malnutrition Etiology: acute illness Signs/Symptoms: moderate fat depletion, moderate muscle depletion Interventions: Ensure Enlive (each supplement provides 350kcal and 20 grams of protein), Magic cup, MVI, Liberalize Diet          Consultants: Cardiology   Procedures: Right/left heart catheterization 2/17   Antibiotics: Azithromycin 2/15-19 Ceftriaxone 2/15-21   30 Day Unplanned Readmission Risk Score    Flowsheet Row ED to Hosp-Admission (Current) from 10/16/2023 in [facility] 4E CV SURGICAL PROGRESSIVE CARE  30 Day Unplanned Readmission Risk Score (%) 39.3 Filed at 10/19/2023 0401       This score is the patient's risk of an unplanned readmission within 30 days of being discharged (0 -100%). The score is based on dignosis, age, lab data, medications, orders, and past utilization.   Low:  0-14.9   Medium: 15-21.9   High: 22-29.9   Extreme: 30 and above           Subjective: Feeling better.  Up in the chair today.  Would like to get off O2.  No SOB/chest pain today. Cough is improving.   Objective: Vitals:   10/19/23 0330 10/19/23 0744  BP: 101/69 (!) 146/74  Pulse: 75 78  Resp: 18 19  Temp: 98.6 F (37 C) 98.4 F (36.9 C)  SpO2: 98% 93%    Intake/Output Summary (Last 24 hours) at 10/19/2023 1057 Last data filed at 10/19/2023 1308 Gross per 24 hour  Intake 988 ml  Output 1300  ml  Net -312 ml   Filed Weights   10/16/23 0714  Weight: 68 kg    Exam:  General:  Appears less ill today, sitting up at the bedside visiting with his son Eyes:   EOMI, normal lids, iris ENT:  grossly normal hearing, lips & tongue, mmm; artificial upper/absent lower dentition Neck:  no LAD, masses or thyromegaly Cardiovascular:  RRR. No LE edema.  Respiratory:   CTA.  Normal respiratory effort on [place] O2 Abdomen:  soft, NT, ND Skin:  no rash or induration seen on limited exam; diffuse seb-k on thorax Musculoskeletal:  grossly normal tone BUE/BLE, good ROM, no bony abnormality Psychiatric:  blunted mood and affect, speech fluent and appropriate, AOx3 Neurologic:  CN 2-12 grossly intact, moves all extremities in coordinated fashion  Data Reviewed: I have reviewed the patient's lab results since admission.  Pertinent labs for today include:   BUN 28/Creatinine 1.28/GFR 57 WBC 9.4 Hgb 9.8     Family Communication: None present  Disposition: Status is: Inpatient Remains inpatient appropriate because: ongoing management     Time spent: 50 minutes  Unresulted Labs (From admission, onward)     Start     Ordered   10/19/23 0500  CBC  with Differential/Platelet  Daily,   R     Question:  Specimen collection method  Answer:  Lab=Lab collect   10/18/23 1331   10/19/23 0500  Basic metabolic panel  Daily,   R     Question:  Specimen collection method  Answer:  Lab=Lab collect   10/18/23 1331   10/18/23 0937  Occult blood card to lab, stool  Once,   R        10/18/23 0936   10/16/23 1254  Expectorated Sputum Assessment w Gram Stain, Rflx to Resp Cult  (COPD / Pneumonia / Cellulitis / Lower Extremity Wound)  Once,   R        10/16/23 1256             Author: Jonah Blue, MD 10/19/2023 10:57 AM  For on call review [email or web address].

## 2023-10-19 NOTE — Progress Notes (Signed)
 PHARMACY - ANTICOAGULATION CONSULT NOTE  Pharmacy Consult for IV heparin Indication afib  Allergies  Allergen Reactions   Codeine Itching and Swelling    Throat swelling   Penicillin G Swelling    Throat    Patient Measurements: Height: 6' (182.9 cm) Weight: 68 kg (150 lb) IBW/kg (Calculated) : 77.6 Heparin Dosing Weight: 68 kg  Vital Signs: Temp: 98.6 F (37 C) (02/18 1935) Temp Source: Oral (02/18 1935) BP: 127/60 (02/18 1935) Pulse Rate: 65 (02/18 1935)  Labs: Recent Labs    10/17/23 0302 10/17/23 0858 10/17/23 1053 10/18/23 0302 10/18/23 2039 10/19/23 0405 10/19/23 2020  HGB 8.4*  --   --  7.1* 9.4* 9.8*  --   HCT 27.4*  --   --  22.9* 29.2* 30.8*  --   PLT 270  --   --  227  --  204  --   APTT  --  71*  --  92*  --   --  45*  HEPARINUNFRC  --   --  1.04* 0.68  --   --  0.22*  CREATININE 1.23  --   --  1.46*  --  1.28*  --   TROPONINIHS  --  12,999*  --   --   --   --   --     Estimated Creatinine Clearance: 45.7 mL/min (A) (by C-G formula based on SCr of 1.28 mg/dL (H)).   Assessment: 78 yo male with afib and history of PAD on Xarelto PTA (last dose 2/14). He was on IV heparin and this was stopped 2/16 due to anemia. Pharmacy consulted to restart IV heparin.  Heparin level 0.22 (?still affected by Xarelto), aPTT 45 sec (subtherapeutic) on infusion at 1100 units/hr. No issues with line or bleeding reported per RN.  Goal of Therapy:  Heparin level 0.3-0.5, aPTT 66-102 Monitor platelets by anticoagulation protocol: Yes   Plan:  Increase heparin infusion to 1200 units/hr aPTT and heparin level in 8 hrs  Christoper Fabian, PharmD, BCPS Please see [email or web address] for complete clinical pharmacist phone list 10/19/2023 8:57 PM

## 2023-10-19 NOTE — Progress Notes (Signed)
 PHARMACY - ANTICOAGULATION CONSULT NOTE  Pharmacy Consult for IV heparin Indication:  afib  Allergies  Allergen Reactions   Codeine Itching and Swelling    Throat swelling   Penicillin G Swelling    Throat    Patient Measurements: Height: 6' (182.9 cm) Weight: 68 kg (150 lb) IBW/kg (Calculated) : 77.6 Heparin Dosing Weight: 68 kg  Vital Signs: Temp: 98 F (36.7 C) (02/18 1107) Temp Source: Oral (02/18 1107) BP: 110/77 (02/18 1107) Pulse Rate: 66 (02/18 1107)  Labs: Recent Labs    10/16/23 1422 10/16/23 1422 10/16/23 1647 10/17/23 0016 10/17/23 0302 10/17/23 0858 10/17/23 1053 10/18/23 0302 10/18/23 2039 10/19/23 0405  HGB  --    < >  --   --  8.4*  --   --  7.1* 9.4* 9.8*  HCT  --    < >  --   --  27.4*  --   --  22.9* 29.2* 30.8*  PLT  --   --   --   --  270  --   --  227  --  204  APTT 51*  --   --  58*  --  71*  --  92*  --   --   HEPARINUNFRC  --   --   --   --   --   --  1.04* 0.68  --   --   CREATININE 1.24  --   --   --  1.23  --   --  1.46*  --  1.28*  TROPONINIHS 12,243*  --  20,977*  --   --  12,999*  --   --   --   --    < > = values in this interval not displayed.    Estimated Creatinine Clearance: 45.7 mL/min (A) (by C-G formula based on SCr of 1.28 mg/dL (H)).   Assessment: 78 yo male with afib and history of PAD on Xarelto PTA (last dose 2/14). He was on IV heparin and this was stopped 2/16 due to anemia. Pharmacy consulted to restart IV heparin   -hg= 9.8 (noted 7.1 on 2/17) -Last heparin was was 1200 units/hr and aPTT was 92  Goal of Therapy:  Heparin level 0.3-0.5, aPTT 66-102 Monitor platelets by anticoagulation protocol: Yes   Plan:  -Restart heparin at 1100 units/hr -aPTT and heparin level in 8 hrs -Heparin level daily wth CBC daily  Harland German, PharmD Clinical Pharmacist **Pharmacist phone directory can now be found on [email or web address] ([email or web address]).  Listed under [facility].

## 2023-10-19 NOTE — Progress Notes (Signed)
 Cardiology Progress Note  Patient ID: Richard Rivers MRN: [id number] DOB: 08/22/1946 Date of Encounter: 10/19/2023 Primary Cardiologist: Rollene Rotunda, MD  Subjective   Chief Complaint: None.   HPI: Reports nosebleeds.  Denies chest pain.  No obvious signs of bleeding.  Received 2 units of packed red blood cells.  Hemoglobin is appropriate.  No further chest pain.  ROS:  All other ROS reviewed and negative. Pertinent positives noted in the HPI.     Telemetry  Overnight telemetry shows sinus rhythm 70s, which I personally reviewed.   Physical Exam   Vitals:   10/18/23 1940 10/18/23 2316 10/19/23 0330 10/19/23 0744  BP: (!) 120/48 137/60 101/69 (!) 146/74  Pulse:  96 75 78  Resp:  20 18 19   Temp: 98.3 F (36.8 C) 98.4 F (36.9 C) 98.6 F (37 C) 98.4 F (36.9 C)  TempSrc: Oral Oral Oral Oral  SpO2:  96% 98% 93%  Weight:      Height:        Intake/Output Summary (Last 24 hours) at 10/19/2023 1024 Last data filed at 10/19/2023 3295 Gross per 24 hour  Intake 988 ml  Output 1300 ml  Net -312 ml       10/16/2023    7:14 AM 09/16/2023    7:00 AM 09/15/2023    5:28 AM  Last 3 Weights  Weight (lbs) 150 lb 144 lb 4.8 oz 144 lb 13.5 oz  Weight (kg) 68.04 kg 65.454 kg 65.7 kg    Body mass index is 20.34 kg/m.  General: Well nourished, well developed, in no acute distress Head: Atraumatic, normal size  Eyes: PEERLA, EOMI  Neck: Supple, no JVD Endocrine: No thryomegaly Cardiac: Normal S1, S2; RRR; no murmurs, rubs, or gallops Lungs: Diminished breath sounds bilaterally Abd: Soft, nontender, no hepatomegaly  Ext: No edema, pulses 2+ Musculoskeletal: No deformities, BUE and BLE strength normal and equal Skin: Warm and dry, no rashes   Neuro: Alert and oriented to person, place, time, and situation, CNII-XII grossly intact, no focal deficits  Psych: Normal mood and affect   Cardiac Studies  TTE 10/17/2023  1. Left ventricular ejection fraction, by estimation, is 35 to  40%. The  left ventricle has moderately decreased function. The left ventricle  demonstrates global hypokinesis. Left ventricular diastolic parameters are  consistent with Grade I diastolic  dysfunction (impaired relaxation).   2. Right ventricular systolic function is normal. The right ventricular  size is normal. There is mildly elevated pulmonary artery systolic  pressure. The estimated right ventricular systolic pressure is 36.1 mmHg.   3. The mitral valve is grossly normal. Trivial mitral valve  regurgitation. No evidence of mitral stenosis.   4. The aortic valve is tricuspid. Aortic valve regurgitation is not  visualized. No aortic stenosis is present.   5. The inferior vena cava is dilated in size with >50% respiratory  variability, suggesting right atrial pressure of 8 mmHg.   Left heart cath 02/2022: Left main PCI, mid LAD 50%, OM1 60%, CTO RCA with collaterals   Patient Profile  Richard Rivers is a 78 y.o. male with CAD status post left main PCI in 2023, CTO of RCA, moderate LAD and OM disease, systolic heart failure and (35-40%), PAD status post left CEA and right femoropopliteal bypass, COPD, hypertension, hyperlipidemia who was admitted on 10/16/2023 for atrial fib with RVR and non-STEMI.   Assessment & Plan   # Non-STEMI versus demand ischemia # Complex CAD history with PCI to left main  and CTO of RCA in 2023 -Admitted with influenza and rapid A-fib.  Had fairly significant troponin elevation but LVEF is unchanged from prior. -Has had chest pain on and off. -Hemoglobin seems to be stable.  He has been transfused.  No obvious signs of bleeding. -We have been managing this medically. -Had been on Plavix and Xarelto at home.  Was on heparin drip but had drop in hemoglobin.  No obvious signs of bleeding.  We will rechallenge him today on Plavix and heparin drip.  As long as hemoglobin remained stable we will proceed with left heart catheterization.  He has a very complex history  of PCI to left main in 2023 he has a CTO of the RCA he also has diffuse moderate disease in the LAD and OM.  He may not have targets for revascularization.  We will follow-up in the morning to determine cardiac cath. -Continue home beta-blocker.  Continue Imdur.  Continue high intensity statin.  # Persistent atrial fibrillation -Back in rhythm.  Rhythm control strategy given systolic dysfunction.  Continue amiodarone. -Rechallenging anticoagulation with heparin as above.  # Acute systolic heart failure, EF 35 to 40% #Ischemic cardiomyopathy -Appears fairly euvolemic.  Plan for left and right heart catheterization tomorrow.  EF is largely unchanged. -Had significant hyperkalemia on admission.  Aldactone and Entresto were held.  Potassium now within limits. -Rechallenge Entresto 24-26 mg twice daily today.  Continue to hold Aldactone.  Continue carvedilol 12.5 mg twice daily. -Hold SGLT2 inhibitor for cath.  Does not need further diuresis. -We will optimize GDMT as we are able.  # Influenza A # Multifocal pneumonia # COPD -Suspect some of this is residual.  He seems to doing quite well.  Per hospital team.  # Anemia -Unclear etiology.  Appropriate response to transfusion.  Rechallenge anticoagulation and antiplatelets today.  # AKI # Hyperkalemia -Potassium much improved.  Kidney function is stable.  No further diuresis.      For questions or updates, please contact [facility] Please consult [email or web address] for contact info under         Signed, Gerri Spore T. Flora Lipps, MD, [facility]  [facility]  10/19/2023 10:24 AM

## 2023-10-19 NOTE — Plan of Care (Signed)

## 2023-10-19 NOTE — Progress Notes (Signed)
 Heart Failure Navigator Progress Note  Assessed for Heart & Vascular TOC clinic readiness.  Patient declined HF TOC at this time. Stated he wants to return to Dr. Eda Keys, his Cardiologist thru the [place] . Marland Kitchen   Navigator will sign off at this time.   Rhae Hammock, BSN, Scientist, clinical (histocompatibility and immunogenetics) Only

## 2023-10-19 NOTE — Progress Notes (Signed)
 Occupational Therapy Treatment Patient Details Name: Richard Rivers MRN: [id number] DOB: 1946-02-03 Today's Date: 10/19/2023   History of present illness Pt is a 78 y.o. male who presented 10/16/23 with chest pain and SOB. Admitted with multilobar PNA, afib with RVR, and NSTEMI. PMH significant for HTN, HLD, CAD with stent placement, NSETMI, gout, depression with anxiety, tobacco use, PAD, HFrEF, COPD   OT comments  Pt sitting on EOB upon therapy arrival and agreeable to participate in OT treatment session focusing on activity tolerance and endurance while walking in hallway utilizing RW. Pt continues to demonstrate decreased activity tolerance and endurance while requiring one standing rest break while walking. Pt is able to self monitor fatigue level and take necessary rest breaks as needed. Pt reports that heart cath has been rescheduled for tomorrow (2/19) as his blood was low yesterday. OT will continue to follow pt acutely.       If plan is discharge home, recommend the following:  A little help with walking and/or transfers;A little help with bathing/dressing/bathroom;Assistance with cooking/housework;Direct supervision/assist for medications management;Help with stairs or ramp for entrance;Assist for transportation   Equipment Recommendations  None recommended by OT       Precautions / Restrictions Precautions Precautions: Fall;Other (comment) Recall of Precautions/Restrictions: Intact Precaution/Restrictions Comments: watch SpO2 Restrictions Weight Bearing Restrictions Per Provider Order: No       Mobility Bed Mobility    General bed mobility comments: seated on EOB upon therapy arrival    Transfers Overall transfer level: Needs assistance Equipment used: Rolling walker (2 wheels) Transfers: Sit to/from Stand, Bed to chair/wheelchair/BSC Sit to Stand: Supervision     Step pivot transfers: Supervision     General transfer comment: No VC required for hand placement  during RW management. SBA required primarily due to line management and IV pole.     Balance Overall balance assessment: No apparent balance deficits (not formally assessed)       ADL either performed or assessed with clinical judgement   ADL        Lower Body Dressing: Set up (Pt donned slip on shoes while seated on EOB without physical assist)                 Communication Communication Communication: No apparent difficulties   Cognition Arousal: Alert Behavior During Therapy: WFL for tasks assessed/performed Cognition: No apparent impairments          Following commands: Intact        Cueing   Cueing Techniques: Verbal cues        General Comments SpO2 remained stable at 92-94% on RA. Pt completed functional mobility focusing on activity tolerance and endurance while walking within hallway down to nurse's station and back using RW and SBA. One brief standing rest break taken due to fatigue. Pt was able to self monitor fatigue level.    Pertinent Vitals/ Pain       Pain Assessment Pain Assessment: Faces Faces Pain Scale: No hurt         Frequency  Min 1X/week        Progress Toward Goals  OT Goals(current goals can now be found in the care plan section)  Progress towards OT goals: Progressing toward goals            AM-PAC OT "6 Clicks" Daily Activity     Outcome Measure   Help from another person eating meals?: None Help from another person taking care of personal grooming?: A Little Help from  another person toileting, which includes using toliet, bedpan, or urinal?: A Little Help from another person bathing (including washing, rinsing, drying)?: A Little Help from another person to put on and taking off regular upper body clothing?: A Little Help from another person to put on and taking off regular lower body clothing?: A Little 6 Click Score: 19    End of Session Equipment Utilized During Treatment: Rolling walker (2 wheels);Gait  belt  OT Visit Diagnosis: Other abnormalities of gait and mobility (R26.89);Muscle weakness (generalized) (M62.81);Other (comment)   Activity Tolerance Patient tolerated treatment well   Patient Left in bed;with call bell/phone within reach   Nurse Communication Other (comment) (SpO2 while walking in hallway)        Time: 3086-5784 OT Time Calculation (min): 13 min  Charges: OT General Charges $OT Visit: 1 Visit OT Treatments $Therapeutic Activity: 8-22 mins  Limmie Patricia, OTR/L,CBIS  Supplemental OT - [facility] and [facility] Secure Chat Preferred    Jillene Wehrenberg, Charisse March 10/19/2023, 12:36 PM

## 2023-10-20 ENCOUNTER — Encounter (HOSPITAL_COMMUNITY)
Admission: EM | Disposition: A | Discharge status: Home Health | Payer: Self-pay | Source: Home / Self Care | Attending: Internal Medicine

## 2023-10-20 ENCOUNTER — Encounter (HOSPITAL_COMMUNITY): Payer: Self-pay | Admitting: Cardiovascular Disease

## 2023-10-20 DIAGNOSIS — I5023 Acute on chronic systolic (congestive) heart failure: Secondary | ICD-10-CM | POA: Diagnosis not present

## 2023-10-20 DIAGNOSIS — I214 Non-ST elevation (NSTEMI) myocardial infarction: Secondary | ICD-10-CM | POA: Diagnosis not present

## 2023-10-20 DIAGNOSIS — I2510 Atherosclerotic heart disease of native coronary artery without angina pectoris: Secondary | ICD-10-CM

## 2023-10-20 DIAGNOSIS — I5020 Unspecified systolic (congestive) heart failure: Secondary | ICD-10-CM | POA: Diagnosis not present

## 2023-10-20 DIAGNOSIS — I483 Typical atrial flutter: Secondary | ICD-10-CM | POA: Diagnosis not present

## 2023-10-20 DIAGNOSIS — J189 Pneumonia, unspecified organism: Secondary | ICD-10-CM | POA: Diagnosis not present

## 2023-10-20 HISTORY — PX: RIGHT/LEFT HEART CATH AND CORONARY ANGIOGRAPHY: CATH118266

## 2023-10-20 LAB — POCT I-STAT EG7
Acid-Base Excess: 3 mmol/L — ABNORMAL HIGH (ref 0.0–2.0)
Bicarbonate: 28.6 mmol/L — ABNORMAL HIGH (ref 20.0–28.0)
Calcium, Ion: 1.3 mmol/L (ref 1.15–1.40)
HCT: 32 % — ABNORMAL LOW (ref 39.0–52.0)
Hemoglobin: 10.9 g/dL — ABNORMAL LOW (ref 13.0–17.0)
O2 Saturation: 92 %
Potassium: 4.5 mmol/L (ref 3.5–5.1)
Sodium: 138 mmol/L (ref 135–145)
TCO2: 30 mmol/L (ref 22–32)
pCO2, Ven: 44.7 mm[Hg] (ref 44–60)
pH, Ven: 7.413 (ref 7.25–7.43)
pO2, Ven: 64 mm[Hg] — ABNORMAL HIGH (ref 32–45)

## 2023-10-20 LAB — CBC WITH DIFFERENTIAL/PLATELET
Abs Immature Granulocytes: 0.04 10*3/uL (ref 0.00–0.07)
Basophils Absolute: 0.1 10*3/uL (ref 0.0–0.1)
Basophils Relative: 1 %
Eosinophils Absolute: 0.2 10*3/uL (ref 0.0–0.5)
Eosinophils Relative: 2 %
HCT: 31.7 % — ABNORMAL LOW (ref 39.0–52.0)
Hemoglobin: 10.1 g/dL — ABNORMAL LOW (ref 13.0–17.0)
Immature Granulocytes: 0 %
Lymphocytes Relative: 16 %
Lymphs Abs: 1.6 10*3/uL (ref 0.7–4.0)
MCH: 28.2 pg (ref 26.0–34.0)
MCHC: 31.9 g/dL (ref 30.0–36.0)
MCV: 88.5 fL (ref 80.0–100.0)
Monocytes Absolute: 1.1 10*3/uL — ABNORMAL HIGH (ref 0.1–1.0)
Monocytes Relative: 10 %
Neutro Abs: 7.4 10*3/uL (ref 1.7–7.7)
Neutrophils Relative %: 71 %
Platelets: 202 10*3/uL (ref 150–400)
RBC: 3.58 MIL/uL — ABNORMAL LOW (ref 4.22–5.81)
RDW: 16.3 % — ABNORMAL HIGH (ref 11.5–15.5)
WBC: 10.5 10*3/uL (ref 4.0–10.5)
nRBC: 0 % (ref 0.0–0.2)

## 2023-10-20 LAB — GLUCOSE, CAPILLARY
Glucose-Capillary: 112 mg/dL — ABNORMAL HIGH (ref 70–99)
Glucose-Capillary: 102 mg/dL — ABNORMAL HIGH (ref 70–99)
Glucose-Capillary: 141 mg/dL — ABNORMAL HIGH (ref 70–99)
Glucose-Capillary: 85 mg/dL (ref 70–99)

## 2023-10-20 LAB — POCT I-STAT 7, (LYTES, BLD GAS, ICA,H+H)
Acid-Base Excess: 4 mmol/L — ABNORMAL HIGH (ref 0.0–2.0)
Bicarbonate: 29.8 mmol/L — ABNORMAL HIGH (ref 20.0–28.0)
Calcium, Ion: 1.31 mmol/L (ref 1.15–1.40)
HCT: 32 % — ABNORMAL LOW (ref 39.0–52.0)
Hemoglobin: 10.9 g/dL — ABNORMAL LOW (ref 13.0–17.0)
O2 Saturation: 57 %
Potassium: 4.4 mmol/L (ref 3.5–5.1)
Sodium: 138 mmol/L (ref 135–145)
TCO2: 31 mmol/L (ref 22–32)
pCO2 arterial: 49.8 mm[Hg] — ABNORMAL HIGH (ref 32–48)
pH, Arterial: 7.385 (ref 7.35–7.45)
pO2, Arterial: 31 mm[Hg] — CL (ref 83–108)

## 2023-10-20 LAB — APTT: aPTT: 57 s — ABNORMAL HIGH (ref 24–36)

## 2023-10-20 LAB — BASIC METABOLIC PANEL
Anion gap: 8 (ref 5–15)
BUN: 30 mg/dL — ABNORMAL HIGH (ref 8–23)
CO2: 26 mmol/L (ref 22–32)
Calcium: 9.5 mg/dL (ref 8.9–10.3)
Chloride: 101 mmol/L (ref 98–111)
Creatinine, Ser: 1.28 mg/dL — ABNORMAL HIGH (ref 0.61–1.24)
GFR, Estimated: 57 mL/min — ABNORMAL LOW (ref >60)
Glucose, Bld: 114 mg/dL — ABNORMAL HIGH (ref 70–99)
Potassium: 4.3 mmol/L (ref 3.5–5.1)
Sodium: 135 mmol/L (ref 135–145)

## 2023-10-20 LAB — HEPARIN LEVEL (UNFRACTIONATED): Heparin Unfractionated: 0.27 [IU]/mL — ABNORMAL LOW (ref 0.30–0.70)

## 2023-10-20 LAB — OCCULT BLOOD X 1 CARD TO LAB, STOOL: Fecal Occult Bld: NEGATIVE

## 2023-10-20 SURGERY — RIGHT/LEFT HEART CATH AND CORONARY ANGIOGRAPHY
Anesthesia: LOCAL

## 2023-10-20 MED ORDER — MIDAZOLAM HCL 2 MG/2ML IJ SOLN
INTRAMUSCULAR | Status: AC
  Filled 2023-10-20: qty 2

## 2023-10-20 MED ORDER — FUROSEMIDE 40 MG PO TABS
40.0000 mg | ORAL_TABLET | Freq: Two times a day (BID) | ORAL | Status: DC
  Administered 2023-10-20: 40 mg via ORAL
  Filled 2023-10-20: qty 1

## 2023-10-20 MED ORDER — SODIUM CHLORIDE 0.9 % IV SOLN: INTRAVENOUS | Status: DC

## 2023-10-20 MED ORDER — VERAPAMIL HCL 2.5 MG/ML IV SOLN
INTRAVENOUS | Status: AC
  Filled 2023-10-20: qty 2

## 2023-10-20 MED ORDER — ASPIRIN 81 MG PO CHEW: 81.0000 mg | CHEWABLE_TABLET | ORAL | Status: DC

## 2023-10-20 MED ORDER — LIDOCAINE HCL (PF) 1 % IJ SOLN
INTRAMUSCULAR | Status: AC
  Filled 2023-10-20: qty 30

## 2023-10-20 MED ORDER — HEPARIN SODIUM (PORCINE) 1000 UNIT/ML IJ SOLN
INTRAMUSCULAR | Status: AC
  Filled 2023-10-20: qty 10

## 2023-10-20 MED ORDER — SODIUM CHLORIDE 0.9% FLUSH
3.0000 mL | Freq: Two times a day (BID) | INTRAVENOUS | Status: DC
  Administered 2023-10-20 – 2023-10-22 (×5): 3 mL via INTRAVENOUS

## 2023-10-20 MED ORDER — LIDOCAINE HCL (PF) 1 % IJ SOLN
INTRAMUSCULAR | Status: DC | PRN
  Administered 2023-10-20 (×2): 2 mL

## 2023-10-20 MED ORDER — HEPARIN (PORCINE) IN NACL 2000-0.9 UNIT/L-% IV SOLN
INTRAVENOUS | Status: DC | PRN
  Administered 2023-10-20: 1000 mL

## 2023-10-20 MED ORDER — SODIUM CHLORIDE 0.9 % IV SOLN: 250.0000 mL | INTRAVENOUS | Status: AC | PRN

## 2023-10-20 MED ORDER — MIDAZOLAM HCL 2 MG/2ML IJ SOLN
INTRAMUSCULAR | Status: DC | PRN
  Administered 2023-10-20: 1 mg via INTRAVENOUS

## 2023-10-20 MED ORDER — SODIUM CHLORIDE 0.9% FLUSH: 3.0000 mL | INTRAVENOUS | Status: DC | PRN

## 2023-10-20 MED ORDER — IOHEXOL 350 MG/ML SOLN
INTRAVENOUS | Status: DC | PRN
  Administered 2023-10-20: 40 mL

## 2023-10-20 MED ORDER — ASPIRIN 81 MG PO CHEW
81.0000 mg | CHEWABLE_TABLET | ORAL | Status: AC
  Administered 2023-10-20: 81 mg via ORAL
  Filled 2023-10-20: qty 1

## 2023-10-20 MED ORDER — VERAPAMIL HCL 2.5 MG/ML IV SOLN
INTRAVENOUS | Status: DC | PRN
  Administered 2023-10-20: 10 mL via INTRA_ARTERIAL

## 2023-10-20 MED ORDER — HEPARIN SODIUM (PORCINE) 1000 UNIT/ML IJ SOLN
INTRAMUSCULAR | Status: DC | PRN
  Administered 2023-10-20: 3000 [IU] via INTRAVENOUS

## 2023-10-20 SURGICAL SUPPLY — 10 items
CATH BALLN WEDGE 5F 110CM (CATHETERS) IMPLANT
CATH INFINITI AMBI 5FR JK (CATHETERS) IMPLANT
COVER PRB 48X5XTLSCP FOLD TPE (BAG) IMPLANT
DEVICE RAD COMP TR BAND LRG (VASCULAR PRODUCTS) IMPLANT
GLIDESHEATH SLEND SS 6F .021 (SHEATH) IMPLANT
GUIDEWIRE INQWIRE 1.5J.035X260 (WIRE) IMPLANT
INQWIRE 1.5J .035X260CM (WIRE) ×1 IMPLANT
PACK CARDIAC CATHETERIZATION (CUSTOM PROCEDURE TRAY) ×1 IMPLANT
SET ATX-X65L (MISCELLANEOUS) IMPLANT
SHEATH GLIDE SLENDER 4/5FR (SHEATH) IMPLANT

## 2023-10-20 NOTE — Progress Notes (Signed)
 PHARMACY - ANTICOAGULATION CONSULT NOTE  Pharmacy Consult for IV heparin Indication afib  Allergies  Allergen Reactions   Codeine Itching and Swelling    Throat swelling   Penicillin G Swelling    Throat    Patient Measurements: Height: 6' (182.9 cm) Weight: 68 kg (150 lb) IBW/kg (Calculated) : 77.6 Heparin Dosing Weight: 68 kg  Vital Signs: Temp: 98.7 F (37.1 C) (02/19 0410) Temp Source: Oral (02/19 0410) BP: 157/50 (02/19 0410) Pulse Rate: 67 (02/19 0410)  Labs: Recent Labs    10/17/23 0858 10/17/23 1053 10/18/23 0302 10/18/23 2039 10/19/23 0405 10/19/23 2020 10/20/23 0647  HGB  --    < > 7.1* 9.4* 9.8*  --  10.1*  HCT  --    < > 22.9* 29.2* 30.8*  --  31.7*  PLT  --   --  227  --  204  --  202  APTT 71*  --  92*  --   --  45* 57*  HEPARINUNFRC  --    < > 0.68  --   --  0.22* 0.27*  CREATININE  --   --  1.46*  --  1.28*  --  1.28*  TROPONINIHS 12,999*  --   --   --   --   --   --    < > = values in this interval not displayed.    Estimated Creatinine Clearance: 45.7 mL/min (A) (by C-G formula based on SCr of 1.28 mg/dL (H)).   Assessment: 78 yo male with afib and history of PAD on Xarelto PTA (last dose 2/14). He was on IV heparin and this was stopped 2/16 due to anemia. Pharmacy consulted to restart IV heparin. Plans noted for possible cath  -heparin level= 0.22> 0.27 on 1200 units/hr -Hg= 10.1  Goal of Therapy:  Heparin level 0.3-0.5 Monitor platelets by anticoagulation protocol: Yes   Plan:  Increase heparin infusion to 1300 units/hr Heparin level and CBC in am  Harland German, PharmD Clinical Pharmacist **Pharmacist phone directory can now be found on [email or web address] ([email or web address]).  Listed under [facility].

## 2023-10-20 NOTE — Progress Notes (Signed)
 PT Cancellation Note  Patient Details Name: AMOGH KOMATSU MRN: [id number] DOB: 03-17-1946   Cancelled Treatment:    Reason Eval/Treat Not Completed: Fatigue/lethargy limiting ability to participate Attempted x 2 to see pt for PT treatment. Attempt 1: pt off unit for heart cath and attempt 2: pt declining due to fatigue from "busy morning". Will attempt again as schedule allows.    Marlana Salvage Zaunegger Blima Rich PT, DPT 10/20/2023, 12:20 PM

## 2023-10-20 NOTE — H&P (View-Only) (Signed)
 Cardiology Progress Note  Patient ID: Richard Rivers MRN: [id number] DOB: Sep 28, 1945 Date of Encounter: 10/20/2023 Primary Cardiologist: Rollene Rotunda, MD  Subjective   Chief Complaint: SOB  HPI: HGB stable. LHC today.   ROS:  All other ROS reviewed and negative. Pertinent positives noted in the HPI.     Telemetry  Overnight telemetry shows SR 60s, which I personally reviewed.   Physical Exam   Vitals:   10/19/23 1935 10/19/23 2305 10/20/23 0410 10/20/23 0849  BP: 127/60 (!) 131/54 (!) 157/50 (!) 135/56  Pulse: 65  67 63  Resp: 18 16 14 20   Temp: 98.6 F (37 C) 98.7 F (37.1 C) 98.7 F (37.1 C) 97.9 F (36.6 C)  TempSrc: Oral Oral Oral Oral  SpO2: 93% 91% 91% 95%  Weight:      Height:        Intake/Output Summary (Last 24 hours) at 10/20/2023 1006 Last data filed at 10/20/2023 0412 Gross per 24 hour  Intake --  Output 600 ml  Net -600 ml       10/16/2023    7:14 AM 09/16/2023    7:00 AM 09/15/2023    5:28 AM  Last 3 Weights  Weight (lbs) 150 lb 144 lb 4.8 oz 144 lb 13.5 oz  Weight (kg) 68.04 kg 65.454 kg 65.7 kg    Body mass index is 20.34 kg/m.  General: Well nourished, well developed, in no acute distress Head: Atraumatic, normal size  Eyes: PEERLA, EOMI  Neck: Supple, no JVD Endocrine: No thryomegaly Cardiac: Normal S1, S2; RRR; no murmurs, rubs, or gallops Lungs: Diminished breath sounds bilaterally Abd: Soft, nontender, no hepatomegaly  Ext: No edema, pulses 2+ Musculoskeletal: No deformities, BUE and BLE strength normal and equal Skin: Warm and dry, no rashes   Neuro: Alert and oriented to person, place, time, and situation, CNII-XII grossly intact, no focal deficits  Psych: Normal mood and affect   Cardiac Studies  TTE 10/17/2023  1. Left ventricular ejection fraction, by estimation, is 35 to 40%. The  left ventricle has moderately decreased function. The left ventricle  demonstrates global hypokinesis. Left ventricular diastolic parameters  are  consistent with Grade I diastolic  dysfunction (impaired relaxation).   2. Right ventricular systolic function is normal. The right ventricular  size is normal. There is mildly elevated pulmonary artery systolic  pressure. The estimated right ventricular systolic pressure is 36.1 mmHg.   3. The mitral valve is grossly normal. Trivial mitral valve  regurgitation. No evidence of mitral stenosis.   4. The aortic valve is tricuspid. Aortic valve regurgitation is not  visualized. No aortic stenosis is present.   5. The inferior vena cava is dilated in size with >50% respiratory  variability, suggesting right atrial pressure of 8 mmHg.   Patient Profile  Richard Rivers is a 78 y.o. male with CAD status post left main PCI in 2023, CTO of RCA, moderate LAD and OM disease, systolic heart failure and (35-40%), PAD status post left CEA and right femoropopliteal bypass, COPD, hypertension, hyperlipidemia who was admitted on 10/16/2023 for atrial fib with RVR and non-STEMI.   Assessment & Plan   # NSTEMI # Complex history of CAD with PCI to left main in 2023 -Admitted with flu and rapid A-fib.  Had significant troponin elevation. -Has had on and off chest pain and anemia.  Hemoglobin is now stable. -He does have a complex history of left main PCI in 2023.  He has a CTO of the RCA.  He has moderate disease in the LAD and circumflex territories. -We will proceed with left and right heart catheterization today. -Has been on Plavix and Xarelto at home.  Currently on heparin and Plavix here.  Also on aspirin.  Likely will transition to Plavix and Eliquis post cath.  This will be dictated by intervention. -Continue home beta-blocker.  Continue Imdur.  Continue high intensity statin.  Informed Consent   Shared Decision Making/Informed Consent The risks [stroke (1 in 1000), death (1 in 1000), kidney failure [usually temporary] (1 in 500), bleeding (1 in 200), allergic reaction [possibly serious] (1 in  200)], benefits (diagnostic support and management of coronary artery disease) and alternatives of a cardiac catheterization were discussed in detail with Mr. Colmenares and he is willing to proceed.      # Persistent A-fib -Converted on amiodarone.  Plan to continue amiodarone in the setting of systolic dysfunction.  Could be considered for outpatient ablation. -On heparin drip.  Transition to DOAC after cath.  # Anemia -Hemoglobin is stable status post 2 units of packed red blood cells.  No further signs of bleeding.  Proceeding with Today.  # Ischemic cardiomyopathy, EF 35-40% -EF unchanged. -Had significant hyperkalemia on admission.  Holding any potential Aldactone. -Back on carvedilol 12.5 mg twice daily, Entresto 24-26 mg twice daily.  I am a little cautious about restarting any Aldactone given hyperkalemia.  I think we should hold this moving forward. -Restart SGLT2 inhibitor after cath.  # Hyperkalemia # AKI -Potassium much improved.  Kidney function improved.  # Influenza A # Multifocal pneumonia # COPD -Per hospital medicine      For questions or updates, please contact [facility] Please consult [email or web address] for contact info under       Signed, Gerri Spore T. Flora Lipps, MD, [facility] [place]  [facility]  10/20/2023 10:06 AM

## 2023-10-20 NOTE — Progress Notes (Signed)
 Cardiology Progress Note  Patient ID: CHRISTOPER BUSHEY MRN: [id number] DOB: Sep 28, 1945 Date of Encounter: 10/20/2023 Primary Cardiologist: Rollene Rotunda, MD  Subjective   Chief Complaint: SOB  HPI: HGB stable. LHC today.   ROS:  All other ROS reviewed and negative. Pertinent positives noted in the HPI.     Telemetry  Overnight telemetry shows SR 60s, which I personally reviewed.   Physical Exam   Vitals:   10/19/23 1935 10/19/23 2305 10/20/23 0410 10/20/23 0849  BP: 127/60 (!) 131/54 (!) 157/50 (!) 135/56  Pulse: 65  67 63  Resp: 18 16 14 20   Temp: 98.6 F (37 C) 98.7 F (37.1 C) 98.7 F (37.1 C) 97.9 F (36.6 C)  TempSrc: Oral Oral Oral Oral  SpO2: 93% 91% 91% 95%  Weight:      Height:        Intake/Output Summary (Last 24 hours) at 10/20/2023 1006 Last data filed at 10/20/2023 0412 Gross per 24 hour  Intake --  Output 600 ml  Net -600 ml       10/16/2023    7:14 AM 09/16/2023    7:00 AM 09/15/2023    5:28 AM  Last 3 Weights  Weight (lbs) 150 lb 144 lb 4.8 oz 144 lb 13.5 oz  Weight (kg) 68.04 kg 65.454 kg 65.7 kg    Body mass index is 20.34 kg/m.  General: Well nourished, well developed, in no acute distress Head: Atraumatic, normal size  Eyes: PEERLA, EOMI  Neck: Supple, no JVD Endocrine: No thryomegaly Cardiac: Normal S1, S2; RRR; no murmurs, rubs, or gallops Lungs: Diminished breath sounds bilaterally Abd: Soft, nontender, no hepatomegaly  Ext: No edema, pulses 2+ Musculoskeletal: No deformities, BUE and BLE strength normal and equal Skin: Warm and dry, no rashes   Neuro: Alert and oriented to person, place, time, and situation, CNII-XII grossly intact, no focal deficits  Psych: Normal mood and affect   Cardiac Studies  TTE 10/17/2023  1. Left ventricular ejection fraction, by estimation, is 35 to 40%. The  left ventricle has moderately decreased function. The left ventricle  demonstrates global hypokinesis. Left ventricular diastolic parameters  are  consistent with Grade I diastolic  dysfunction (impaired relaxation).   2. Right ventricular systolic function is normal. The right ventricular  size is normal. There is mildly elevated pulmonary artery systolic  pressure. The estimated right ventricular systolic pressure is 36.1 mmHg.   3. The mitral valve is grossly normal. Trivial mitral valve  regurgitation. No evidence of mitral stenosis.   4. The aortic valve is tricuspid. Aortic valve regurgitation is not  visualized. No aortic stenosis is present.   5. The inferior vena cava is dilated in size with >50% respiratory  variability, suggesting right atrial pressure of 8 mmHg.   Patient Profile  ALANSON HAUSMANN is a 78 y.o. male with CAD status post left main PCI in 2023, CTO of RCA, moderate LAD and OM disease, systolic heart failure and (35-40%), PAD status post left CEA and right femoropopliteal bypass, COPD, hypertension, hyperlipidemia who was admitted on 10/16/2023 for atrial fib with RVR and non-STEMI.   Assessment & Plan   # NSTEMI # Complex history of CAD with PCI to left main in 2023 -Admitted with flu and rapid A-fib.  Had significant troponin elevation. -Has had on and off chest pain and anemia.  Hemoglobin is now stable. -He does have a complex history of left main PCI in 2023.  He has a CTO of the RCA.  He has moderate disease in the LAD and circumflex territories. -We will proceed with left and right heart catheterization today. -Has been on Plavix and Xarelto at home.  Currently on heparin and Plavix here.  Also on aspirin.  Likely will transition to Plavix and Eliquis post cath.  This will be dictated by intervention. -Continue home beta-blocker.  Continue Imdur.  Continue high intensity statin.  Informed Consent   Shared Decision Making/Informed Consent The risks [stroke (1 in 1000), death (1 in 1000), kidney failure [usually temporary] (1 in 500), bleeding (1 in 200), allergic reaction [possibly serious] (1 in  200)], benefits (diagnostic support and management of coronary artery disease) and alternatives of a cardiac catheterization were discussed in detail with Mr. Colmenares and he is willing to proceed.      # Persistent A-fib -Converted on amiodarone.  Plan to continue amiodarone in the setting of systolic dysfunction.  Could be considered for outpatient ablation. -On heparin drip.  Transition to DOAC after cath.  # Anemia -Hemoglobin is stable status post 2 units of packed red blood cells.  No further signs of bleeding.  Proceeding with Today.  # Ischemic cardiomyopathy, EF 35-40% -EF unchanged. -Had significant hyperkalemia on admission.  Holding any potential Aldactone. -Back on carvedilol 12.5 mg twice daily, Entresto 24-26 mg twice daily.  I am a little cautious about restarting any Aldactone given hyperkalemia.  I think we should hold this moving forward. -Restart SGLT2 inhibitor after cath.  # Hyperkalemia # AKI -Potassium much improved.  Kidney function improved.  # Influenza A # Multifocal pneumonia # COPD -Per hospital medicine      For questions or updates, please contact [facility] Please consult [email or web address] for contact info under       Signed, Gerri Spore T. Flora Lipps, MD, [facility] [place]  [facility]  10/20/2023 10:06 AM

## 2023-10-20 NOTE — Interval H&P Note (Signed)
 History and Physical Interval Note:  10/20/2023 10:28 AM  Richard Rivers  has presented today for surgery, with the diagnosis of nstemi.  The various methods of treatment have been discussed with the patient and family. After consideration of risks, benefits and other options for treatment, the patient has consented to  Procedure(s): RIGHT/LEFT HEART CATH AND CORONARY ANGIOGRAPHY (N/A) as a surgical intervention.  The patient's history has been reviewed, patient examined, no change in status, stable for surgery.  I have reviewed the patient's chart and labs.  Questions were answered to the patient's satisfaction.     Lorine Bears

## 2023-10-20 NOTE — Progress Notes (Signed)
 O2 at rest room air 93% O2 while ambulating on room air 94% No need for 02 when walking in hall.

## 2023-10-20 NOTE — Progress Notes (Signed)
 PHARMACY - ANTICOAGULATION CONSULT NOTE  Pharmacy Consult for IV heparin Indication afib  Allergies  Allergen Reactions   Codeine Itching and Swelling    Throat swelling   Penicillin G Swelling    Throat    Patient Measurements: Height: 6' (182.9 cm) Weight: 68 kg (150 lb) IBW/kg (Calculated) : 77.6 Heparin Dosing Weight: 68 kg  Vital Signs: Temp: 98.1 F (36.7 C) (02/19 1123) Temp Source: Oral (02/19 1123) BP: 147/65 (02/19 1215) Pulse Rate: 76 (02/19 1123)  Labs: Recent Labs    10/18/23 0302 10/18/23 2039 10/19/23 0405 10/19/23 2020 10/20/23 0647  HGB 7.1* 9.4* 9.8*  --  10.1*  HCT 22.9* 29.2* 30.8*  --  31.7*  PLT 227  --  204  --  202  APTT 92*  --   --  45* 57*  HEPARINUNFRC 0.68  --   --  0.22* 0.27*  CREATININE 1.46*  --  1.28*  --  1.28*    Estimated Creatinine Clearance: 45.7 mL/min (A) (by C-G formula based on SCr of 1.28 mg/dL (H)).   Assessment: 78 yo male with afib and history of PAD on Xarelto PTA (last dose 2/14). He was on IV heparin and this was stopped 2/16 due to anemia. Pharmacy consulted to restart IV heparin. Plans noted for possible cath  -heparin level= 0.22> 0.27 on 1200 units/hr -Hg= 10.1  Goal of Therapy:  Heparin level 0.3-0.5 Monitor platelets by anticoagulation protocol: Yes   Plan:  Increase heparin infusion to 1300 units/hr Heparin level and CBC in am  Harland German, PharmD Clinical Pharmacist **Pharmacist phone directory can now be found on [email or web address] ([email or web address]).  Listed under [facility].   Addendum -he is now s/p cath with no intervention -heparin to restart 2 hrs after TR band removal (sheath removed ~ 11am)  Plan -restart heparin 1300 units/hr 2 hrs after TR band removal -Will follow plans for oral anticoagulation  Harland German, PharmD Clinical Pharmacist **Pharmacist phone directory can now be found on [email or web address] ([email or web address]).  Listed under [facility].

## 2023-10-20 NOTE — Progress Notes (Signed)
 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 He is scheduled for cath at the [place] on 3/3. He was diagnosed with PNA and is being treated with Ceftriaxone and Azithromycin.  Once again, he has an NSTEMI with troponin elevation up to [address]00+ and is on heparin drip.    Hgb fell to 7.1 on 2/17 and he was transfused 2 units PRBC.  S/p cardiac cath-- plan for medical management.   Assessment and Plan:  Multilobar PNA Patient presenting with nonproductive cough, chest discomfort, and multifocal infiltrates on chest x-ray and subsequent chest CT This appears to be most likely community-acquired pneumonia; he was previously positive for influenza and is still testing positive, but this is a lesser consideration Blood cultures-- NGTD Continue Ceftriaxone x 7 days/completed IV Azithromycin x 3 days PRN nebs   Non-ST elevation MI  Patient declined to have cardiac cath during prior hospitalization and cards recommended medical management Limited echocardiogram similar to one from 09/03/2023.  EF 35 to 40%.  Repeat without WMA. On Plavix and Xarelto at home; transitioned to heparin here but now holding this per cardiology given worsening anemia Continue Imdur, carvedilol Heart cath 2/19: Recommend medical therapy for coronary artery disease. Suspect elevated troponin is due to supply demand ischemia.    Anemia -had nose bleed prior to admission Hgb PTA was 8.9, likely overestimated no admission at 10.7 Transfused 2 units PRBC with appropriate compensation If ongoing drift, needs GI consult for evaluation -  although no report of dark/bloody stools (no stools since admission) Reports colonoscopy 2-3 years ago with instructions to have another in 1 year but this is not visible (to me) in CareEverywhere -trend   Chronic combined heart failure Presented with shortness of breath with minimal activity Known EF of 35 to 40% No obvious volume overload clinically or on imaging at this time No longer taking home Lasix Hold Entresto in the setting of hyperK+, worsening renal function   Atrial flutter EKG with persistent flutter that appeared to be Vtach Also with LBBB Monitor currently appears to show NSR Cardiology consulted and started PO amiodarone   Hyperkalemia Given calcium gluconate and Lokelma with resolution Entresto held on presentation, will defer to cardiology about when to resume   COPD  Recently completed Tamiflu for 5 days; positive test is unlikely to be active infection No evidence of decompensation from this standpoint currently He will continue to use albuterol as needed Continue Mucinex, Combivent Patient recently discharged with supplemental oxygen of 2 L with mobility   Essential hypertension  Continue carvedilol   Peripheral artery disease  Status post right femoral peroneal bypass in 2023 On Plavix, Heparin (in lieu of Xarelto) but these are on hold Continue statin   Stage 3a CKD Appears to be stable at this time - while worse than yesterday, he appears to have stage 3a CKD at baseline -daily labs   HLD Continue 80 mg atorvastatin daily   Depression and anxiety Continue buspirone, hydroxyzine, and trazodone   Marijuana use Counseled to quit smoking and find alternative method of use if needed Praise provided that he  has stopped smoking cigarettes   Prediabetes Recent A1c was 6 Will start sensitive scale SSI   Peripheral neuropathy Military-related issues Uses THC for this issue No longer taking gabapentin   Prolonged QTc Will attempt to avoid  QT-prolonging medications such as PPI, nausea meds, SSRIs   Gout Continue once daily allopurinol  Severe malnutrition Nutrition Problem: Severe Malnutrition Etiology: acute illness Signs/Symptoms: moderate fat depletion, moderate muscle depletion Interventions: Ensure Enlive (each supplement provides 350kcal and 20 grams of protein), Magic cup, MVI, Liberalize Diet          Consultants: Cardiology        Subjective: nervous about heart cath   Objective: Vitals:   10/20/23 1100 10/20/23 1123  BP: (!) 141/70 (!) 143/88  Pulse: 69 76  Resp: 18 20  Temp:  98.1 F (36.7 C)  SpO2: 90% 91%    Intake/Output Summary (Last 24 hours) at 10/20/2023 1137 Last data filed at 10/20/2023 1610 Gross per 24 hour  Intake --  Output 500 ml  Net -500 ml   Filed Weights   10/16/23 0714  Weight: 68 kg    Exam:   General: Appearance:    Well developed, well nourished male in no acute distress     Lungs:     respirations unlabored  Heart:    Normal heart rate. Normal rhythm. No murmurs, rubs, or gallops.   MS:   All extremities are intact.   Neurologic:   Awake, alert    Data Reviewed: I have reviewed the patient's lab results since admission.  Pertinent labs for today include:  Hgb 10.1 CR: 1.28   Family Communication: None present  Disposition: Status is: Inpatient Remains inpatient appropriate because: ongoing management   Time spent: 50 minutes    Author: Joseph Art, DO 10/20/2023 11:37 AM  For on call review [email or web address].

## 2023-10-21 DIAGNOSIS — I483 Typical atrial flutter: Secondary | ICD-10-CM | POA: Diagnosis not present

## 2023-10-21 DIAGNOSIS — I214 Non-ST elevation (NSTEMI) myocardial infarction: Secondary | ICD-10-CM | POA: Diagnosis not present

## 2023-10-21 DIAGNOSIS — J189 Pneumonia, unspecified organism: Secondary | ICD-10-CM | POA: Diagnosis not present

## 2023-10-21 DIAGNOSIS — I5020 Unspecified systolic (congestive) heart failure: Secondary | ICD-10-CM | POA: Diagnosis not present

## 2023-10-21 LAB — CBC WITH DIFFERENTIAL/PLATELET
Abs Immature Granulocytes: 0.04 10*3/uL (ref 0.00–0.07)
Basophils Absolute: 0.1 10*3/uL (ref 0.0–0.1)
Basophils Relative: 1 %
Eosinophils Absolute: 0.2 10*3/uL (ref 0.0–0.5)
Eosinophils Relative: 2 %
HCT: 31 % — ABNORMAL LOW (ref 39.0–52.0)
Hemoglobin: 10 g/dL — ABNORMAL LOW (ref 13.0–17.0)
Immature Granulocytes: 0 %
Lymphocytes Relative: 20 %
Lymphs Abs: 2 10*3/uL (ref 0.7–4.0)
MCH: 28.7 pg (ref 26.0–34.0)
MCHC: 32.3 g/dL (ref 30.0–36.0)
MCV: 88.8 fL (ref 80.0–100.0)
Monocytes Absolute: 1 10*3/uL (ref 0.1–1.0)
Monocytes Relative: 10 %
Neutro Abs: 6.6 10*3/uL (ref 1.7–7.7)
Neutrophils Relative %: 67 %
Platelets: 205 10*3/uL (ref 150–400)
RBC: 3.49 MIL/uL — ABNORMAL LOW (ref 4.22–5.81)
RDW: 16.4 % — ABNORMAL HIGH (ref 11.5–15.5)
WBC: 9.8 10*3/uL (ref 4.0–10.5)
nRBC: 0 % (ref 0.0–0.2)

## 2023-10-21 LAB — CULTURE, BLOOD (ROUTINE X 2)
Culture: NO GROWTH
Culture: NO GROWTH

## 2023-10-21 LAB — BASIC METABOLIC PANEL
Anion gap: 10 (ref 5–15)
BUN: 26 mg/dL — ABNORMAL HIGH (ref 8–23)
CO2: 27 mmol/L (ref 22–32)
Calcium: 9.6 mg/dL (ref 8.9–10.3)
Chloride: 101 mmol/L (ref 98–111)
Creatinine, Ser: 1.05 mg/dL (ref 0.61–1.24)
GFR, Estimated: >60 mL/min (ref >60)
Glucose, Bld: 95 mg/dL (ref 70–99)
Potassium: 4.4 mmol/L (ref 3.5–5.1)
Sodium: 138 mmol/L (ref 135–145)

## 2023-10-21 LAB — GLUCOSE, CAPILLARY
Glucose-Capillary: 87 mg/dL (ref 70–99)
Glucose-Capillary: 132 mg/dL — ABNORMAL HIGH (ref 70–99)
Glucose-Capillary: 103 mg/dL — ABNORMAL HIGH (ref 70–99)
Glucose-Capillary: 122 mg/dL — ABNORMAL HIGH (ref 70–99)

## 2023-10-21 LAB — HEPARIN LEVEL (UNFRACTIONATED): Heparin Unfractionated: 0.35 [IU]/mL (ref 0.30–0.70)

## 2023-10-21 MED ORDER — APIXABAN 5 MG PO TABS: 5.0000 mg | ORAL_TABLET | Freq: Two times a day (BID) | ORAL | Status: DC

## 2023-10-21 MED ORDER — RIVAROXABAN 20 MG PO TABS
20.0000 mg | ORAL_TABLET | Freq: Every day | ORAL | Status: DC
  Administered 2023-10-21: 20 mg via ORAL
  Filled 2023-10-21: qty 1

## 2023-10-21 MED ORDER — EMPAGLIFLOZIN 10 MG PO TABS
10.0000 mg | ORAL_TABLET | Freq: Every day | ORAL | Status: DC
  Administered 2023-10-21 – 2023-10-22 (×2): 10 mg via ORAL
  Filled 2023-10-21 (×2): qty 1

## 2023-10-21 MED ORDER — FUROSEMIDE 10 MG/ML IJ SOLN
40.0000 mg | Freq: Two times a day (BID) | INTRAMUSCULAR | Status: AC
  Administered 2023-10-21 (×2): 40 mg via INTRAVENOUS
  Filled 2023-10-21 (×2): qty 4

## 2023-10-21 NOTE — Progress Notes (Signed)
 PHARMACY - ANTICOAGULATION CONSULT NOTE  Pharmacy Consult for IV heparin Indication afib  Allergies  Allergen Reactions   Codeine Itching and Swelling    Throat swelling   Penicillin G Swelling    Throat    Patient Measurements: Height: 6' (182.9 cm) Weight: 68 kg (150 lb) IBW/kg (Calculated) : 77.6 Heparin Dosing Weight: 68 kg  Vital Signs: Temp: 98.5 F (36.9 C) (02/20 0035) Temp Source: Oral (02/20 0035) BP: 134/58 (02/20 0035) Pulse Rate: 68 (02/19 2055)  Labs: Recent Labs    10/18/23 0302 10/18/23 2039 10/19/23 0405 10/19/23 2020 10/20/23 0647 10/20/23 1045 10/20/23 1049 10/21/23 0034  HGB 7.1*   < > 9.8*  --  10.1* 10.9* 10.9* 10.0*  HCT 22.9*   < > 30.8*  --  31.7* 32.0* 32.0* 31.0*  PLT 227  --  204  --  202  --   --  205  APTT 92*  --   --  45* 57*  --   --   --   HEPARINUNFRC 0.68  --   --  0.22* 0.27*  --   --  0.35  CREATININE 1.46*  --  1.28*  --  1.28*  --   --  1.05   < > = values in this interval not displayed.    Estimated Creatinine Clearance: 55.8 mL/min (by C-G formula based on SCr of 1.05 mg/dL).   Assessment: 78 yo male with afib and history of PAD on Xarelto PTA (last dose 2/14). He was on IV heparin and this was stopped 2/16 due to anemia. Pharmacy consulted to restart IV heparin. Plans noted for possible cath  -heparin level= 0.22> 0.27 on 1200 units/hr -Hg= 10.1  2/20 AM update:  Heparin level therapeutic after re-start s/p cath  Goal of Therapy:  Heparin level 0.3-0.5 units/mL Monitor platelets by anticoagulation protocol: Yes   Plan:  Cont heparin 1300 units/hr Heparin level in 8 hours  Abran Duke, PharmD, BCPS Clinical Pharmacist Phone: [phone]

## 2023-10-21 NOTE — Progress Notes (Signed)
 Pt refused MI book and MI education stating that he was here "a week ago" and received all the materials and education provided then. Pt states that is not interested in doing CR at [facility] but understands I have to put in referral.  Pt reports walking well with PT  Faustino Congress MS, ACSM-CEP 10/21/2023 11:33 AM

## 2023-10-21 NOTE — Progress Notes (Signed)
 Physical Therapy Treatment Patient Details Name: Richard Rivers MRN: [id number] DOB: [birth year] Today's Date: 10/21/2023   History of Present Illness Pt is a 78 y.o. male who presented 10/16/23 with chest pain and SOB. Admitted with multilobar PNA, afib with RVR, and NSTEMI. PMH significant for HTN, HLD, CAD with stent placement, NSETMI, gout, depression with anxiety, tobacco use, PAD, HFrEF, COPD    PT Comments  Pt admitted with above diagnosis. Pt progressing well and is close to baseline.  VSS on RA.  Pt currently with functional limitations due to the deficits listed below (see PT Problem List). Pt will benefit from acute skilled PT to increase their independence and safety with mobility to allow discharge.       If plan is discharge home, recommend the following: A little help with bathing/dressing/bathroom;Assistance with cooking/housework;Assist for transportation   Can travel by private vehicle        Equipment Recommendations  None recommended by PT    Recommendations for Other Services       Precautions / Restrictions Precautions Precautions: Fall;Other (comment) Recall of Precautions/Restrictions: Intact Precaution/Restrictions Comments: watch SpO2 Restrictions Weight Bearing Restrictions Per Provider Order: No     Mobility  Bed Mobility               General bed mobility comments: seated on EOB upon therapy arrival    Transfers Overall transfer level: Needs assistance Equipment used: Rolling walker (2 wheels) Transfers: Sit to/from Stand, Bed to chair/wheelchair/BSC Sit to Stand: Supervision   Step pivot transfers: Supervision       General transfer comment: No VC required for hand placement during RW management.    Ambulation/Gait Ambulation/Gait assistance: Supervision Gait Distance (Feet): 130 Feet Assistive device: Rolling walker (2 wheels) Gait Pattern/deviations: Step-through pattern, Decreased stride length, Decreased dorsiflexion - left,  Trunk flexed Gait velocity: reduced Gait velocity interpretation: <1.8 ft/sec, indicate of risk for recurrent falls   General Gait Details: Pt ambulates with a step-through gait pattern with noted decreased L foot clearance (baseline per pt) and increased trunk flexion. Pt needs VCs to improve upright posture.  No LOB, CGA for safety in hospital environment.   Stairs             Wheelchair Mobility     Tilt Bed    Modified Rankin (Stroke Patients Only)       Balance Overall balance assessment: No apparent balance deficits (not formally assessed) Sitting-balance support: No upper extremity supported, Feet supported Sitting balance-[name]: Good     Standing balance support: Single extremity supported, Bilateral upper extremity supported, No upper extremity supported, During functional activity Standing balance-[name]: Fair Standing balance comment: Able to stand statically without UE support but benefits from 1-2 UE support for dynamic standing balance                            Communication Communication Communication: No apparent difficulties  Cognition Arousal: Alert Behavior During Therapy: WFL for tasks assessed/performed   PT - Cognitive impairments: No apparent impairments                         Following commands: Intact      Cueing Cueing Techniques: Verbal cues  Exercises Other Exercises Other Exercises: pt with IS and educated pt on using it x10/hour, pt performed several reps while supine    General Comments General comments (skin integrity, edema, etc.): VSS on  RA      Pertinent Vitals/Pain Pain Assessment Pain Assessment: No/denies pain    Home Living                          Prior Function            PT Goals (current goals can now be found in the care plan section) Acute Rehab PT Goals Patient Stated Goal: to improve Progress towards PT goals: Progressing toward goals    Frequency     Min 1X/week      PT Plan      Co-evaluation              AM-PAC PT "6 Clicks" Mobility   Outcome Measure  Help needed turning from your back to your side while in a flat bed without using bedrails?: None Help needed moving from lying on your back to sitting on the side of a flat bed without using bedrails?: None Help needed moving to and from a bed to a chair (including a wheelchair)?: A Little Help needed standing up from a chair using your arms (e.g., wheelchair or bedside chair)?: A Little Help needed to walk in hospital room?: A Little Help needed climbing 3-5 steps with a railing? : A Little 6 Click Score: 20    End of Session Equipment Utilized During Treatment: Oxygen;Gait belt Activity Tolerance: Patient tolerated treatment well Patient left: with call bell/phone within reach;in bed;with bed alarm set Nurse Communication: Mobility status PT Visit Diagnosis: Unsteadiness on feet (R26.81);Other abnormalities of gait and mobility (R26.89);Muscle weakness (generalized) (M62.81);Difficulty in walking, not elsewhere classified (R26.2)     Time: 1914-7829 PT Time Calculation (min) (ACUTE ONLY): 30 min  Charges:    $Gait Training: 23-37 mins PT General Charges $$ ACUTE PT VISIT: 1 Visit                     Richard Rivers Richard Rivers,PT Acute Rehab Services [phone]    Richard Rivers 10/21/2023, 12:29 PM

## 2023-10-21 NOTE — Discharge Instructions (Signed)
 Information on my medicine - XARELTO (Rivaroxaban)  Why was Xarelto prescribed for you? Xarelto was prescribed for you to reduce the risk of a blood clot forming that can cause a stroke if you have a medical condition called atrial fibrillation (a type of irregular heartbeat).  What do you need to know about xarelto ? Take your Xarelto ONCE DAILY at the same time every day with your evening meal. If you have difficulty swallowing the tablet whole, you may crush it and mix in applesauce just prior to taking your dose.  Take Xarelto exactly as prescribed by your doctor and DO NOT stop taking Xarelto without talking to the doctor who prescribed the medication.  Stopping without other stroke prevention medication to take the place of Xarelto may increase your risk of developing a clot that causes a stroke.  Refill your prescription before you run out.  After discharge, you should have regular check-up appointments with your healthcare provider that is prescribing your Xarelto.  In the future your dose may need to be changed if your kidney function or weight changes by a significant amount.  What do you do if you miss a dose? If you are taking Xarelto ONCE DAILY and you miss a dose, take it as soon as you remember on the same day then continue your regularly scheduled once daily regimen the next day. Do not take two doses of Xarelto at the same time or on the same day.   Important Safety Information A possible side effect of Xarelto is bleeding. You should call your healthcare provider right away if you experience any of the following: ? Bleeding from an injury or your nose that does not stop. ? Unusual colored urine (red or dark brown) or unusual colored stools (red or black). ? Unusual bruising for unknown reasons. ? A serious fall or if you hit your head (even if there is no bleeding).  Some medicines may interact with Xarelto and might increase your risk of bleeding while on  Xarelto. To help avoid this, consult your healthcare provider or pharmacist prior to using any new prescription or non-prescription medications, including herbals, vitamins, non-steroidal anti-inflammatory drugs (NSAIDs) and supplements.  This website has more information on Xarelto: [email or web address].

## 2023-10-21 NOTE — Progress Notes (Signed)
 Cardiology Progress Note  Patient ID: Richard Rivers MRN: [id number] DOB: 09-09-1945 Date of Encounter: 10/21/2023 Primary Cardiologist: Rollene Rotunda, MD  Subjective   Chief Complaint: SOB  HPI: No targets for revascularization.  Plan for diuresis today.  Likely discharge tomorrow.  ROS:  All other ROS reviewed and negative. Pertinent positives noted in the HPI.     Telemetry  Overnight telemetry shows sinus rhythm 70s, which I personally reviewed.   Physical Exam   Vitals:   10/21/23 0035 10/21/23 0455 10/21/23 0740 10/21/23 0815  BP: (!) 134/58 (!) 151/54  (!) 152/76  Pulse:      Resp: 17 20  16   Temp: 98.5 F (36.9 C) 98.6 F (37 C)  98.7 F (37.1 C)  TempSrc: Oral Oral  Oral  SpO2: 94% 93% 94% 99%  Weight:      Height:        Intake/Output Summary (Last 24 hours) at 10/21/2023 0952 Last data filed at 10/21/2023 0601 Gross per 24 hour  Intake 356 ml  Output 1000 ml  Net -644 ml       10/16/2023    7:14 AM 09/16/2023    7:00 AM 09/15/2023    5:28 AM  Last 3 Weights  Weight (lbs) 150 lb 144 lb 4.8 oz 144 lb 13.5 oz  Weight (kg) 68.04 kg 65.454 kg 65.7 kg    Body mass index is 20.34 kg/m.  General: Well nourished, well developed, in no acute distress Head: Atraumatic, normal size  Eyes: PEERLA, EOMI  Neck: Supple, no JVD Endocrine: No thryomegaly Cardiac: Normal S1, S2; RRR; no murmurs, rubs, or gallops Lungs: Crackles at the lung bases Abd: Soft, nontender, no hepatomegaly  Ext: Trace edema Musculoskeletal: No deformities, BUE and BLE strength normal and equal Skin: Warm and dry, no rashes   Neuro: Alert and oriented to person, place, time, and situation, CNII-XII grossly intact, no focal deficits  Psych: Normal mood and affect   Cardiac Studies  [facility] 10/20/2023   Ost Cx to Prox Cx lesion is 50% stenosed.   Mid LM to Dist LM lesion is 50% stenosed.   Prox RCA to Mid RCA lesion is 100% stenosed.   1st Diag lesion is 70% stenosed.   1.  Patent left  main stent extending into the ostial left circumflex with moderate in-stent restenosis.  Chronically occluded right coronary artery with left-to-right collaterals. 2.  Left ventricular angiography was not performed.  EF was moderately reduced by echo. 3.  Right heart catheterization showed moderately elevated wedge pressure, moderate pulmonary hypertension and normal cardiac output.  TTE 10/17/2023  1. Left ventricular ejection fraction, by estimation, is 35 to 40%. The  left ventricle has moderately decreased function. The left ventricle  demonstrates global hypokinesis. Left ventricular diastolic parameters are  consistent with Grade I diastolic  dysfunction (impaired relaxation).   2. Right ventricular systolic function is normal. The right ventricular  size is normal. There is mildly elevated pulmonary artery systolic  pressure. The estimated right ventricular systolic pressure is 36.1 mmHg.   3. The mitral valve is grossly normal. Trivial mitral valve  regurgitation. No evidence of mitral stenosis.   4. The aortic valve is tricuspid. Aortic valve regurgitation is not  visualized. No aortic stenosis is present.   5. The inferior vena cava is dilated in size with >50% respiratory  variability, suggesting right atrial pressure of 8 mmHg.   Patient Profile  Richard Rivers is a 78 y.o. male with CAD status  post left main PCI in 2023, CTO of RCA, moderate LAD and OM disease, systolic heart failure and (35-40%), PAD status post left CEA and right femoropopliteal bypass, COPD, hypertension, hyperlipidemia who was admitted on 10/16/2023 for atrial fib with RVR and non-STEMI.   Assessment & Plan   # NSTEMI # Moderate in-stent restenosis of the left main stent -no new targets for revasc.  Moderate in-stent restenosis of left main stent.  Intervention not performed.  Medical management was recommended by interventional cardiology. -Troponin elevation is demand ischemia in the setting of rapid  A-fib. -continue plavix. Restart home Xarelto. -No further symptoms of angina. -Continue beta-blocker, Imdur and high intensity statin.  # Persistent A-fib -Converted on amiodarone.  In the setting of systolic dysfunction plan for rhythm control strategy.  Can consider outpatient ablation. -Plan for 40 mg of amiodarone twice daily for 7 days followed by 200 mg daily. -Transition back to home Xarelto.  # Acute systolic heart failure, EF 35 to 40% -EF unchanged.  Slightly volume up based on right heart cath numbers.  Plan for Lasix 40 mg IV twice daily today.  Transition to oral diuretics tomorrow. -Continue carvedilol 12.5 mg twice daily, Entresto 24-26 mg twice daily. -start Jardiance 10 mg daily. -Admitted with significant hyperkalemia.  We will avoid Aldactone for now. -Anticipate transition to oral diuretics tomorrow.  We will see how he does.  # AKI # Hyperkalemia -Resolved.  I still think we should avoid Aldactone moving forward.  # Influenza A # Multifocal pneumonia # COPD -Per hospital medicine  For questions or updates, please contact [facility] Please consult [email or web address] for contact info under       Signed, Gerri Spore T. Flora Lipps, MD, [facility] [place]  [facility]  10/21/2023 9:52 AM

## 2023-10-21 NOTE — Progress Notes (Signed)
 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 He is scheduled for cath at the [place] on 3/3. He was diagnosed with PNA and is being treated with Ceftriaxone and Azithromycin.  Once again, he has an NSTEMI with troponin elevation up to [address]00+ and is on heparin drip.    Hgb fell to 7.1 on 2/17 and he was transfused 2 units PRBC.  S/p cardiac cath-- plan for medical management. Home in AM after diuresis   Assessment and Plan:  Multilobar PNA Patient presenting with nonproductive cough, chest discomfort, and multifocal infiltrates on chest x-ray and subsequent chest CT This appears to be most likely community-acquired pneumonia; he was previously positive for influenza and is still testing positive, but this is a lesser consideration Blood cultures-- NGTD -Ceftriaxone x 7 days/completed IV Azithromycin x 3 days PRN nebs   Non-ST elevation MI  Patient declined to have cardiac cath during prior hospitalization and cards recommended medical management Limited echocardiogram similar to one from 09/03/2023.  EF 35 to 40%.  Repeat without WMA. On Plavix and Xarelto at home; transitioned to heparin here but now holding this per cardiology given worsening anemia Continue Imdur, carvedilol Heart cath 2/19: Recommend medical therapy for coronary artery disease. Suspect elevated troponin is due to supply demand ischemia.    Anemia -had nose bleed prior to admission Hgb PTA was 8.9, likely overestimated no admission at 10.7 Transfused 2 units PRBC with appropriate compensation If ongoing drift, needs GI  consult for evaluation - although no report of dark/bloody stools (no stools since admission) Reports colonoscopy 2-3 years ago with instructions to have another in 1 year but this is not visible (to me) in CareEverywhere -trend   Chronic combined heart failure Presented with shortness of breath with minimal activity Known EF of 35 to 40% No obvious volume overload clinically or on imaging at this time No longer taking home Lasix Hold Entresto in the setting of hyperK+, worsening renal function -IV lasix per cards -home in AM?   Atrial flutter EKG with persistent flutter that appeared to be Vtach Also with LBBB Monitor currently appears to show NSR Cardiology consulted and started PO amiodarone   Hyperkalemia Given calcium gluconate and Lokelma with resolution Entresto held on presentation, will defer to cardiology about when to resume   COPD  Recently completed Tamiflu for 5 days; positive test is unlikely to be active infection No evidence of decompensation from this standpoint currently He will continue to use albuterol as needed Continue Mucinex, Combivent Patient recently discharged with supplemental oxygen of 2 L with mobility   Essential hypertension  Continue carvedilol   Peripheral artery disease  Status post right femoral peroneal bypass in 2023 On Plavix, Heparin (in lieu of Xarelto) but these are on hold Continue statin   Stage 3a CKD Appears to be stable at this time - while worse than yesterday, he appears to have stage 79 CKD at baseline -daily labs   HLD Continue 80 mg atorvastatin daily   Depression and anxiety Continue buspirone, hydroxyzine, and trazodone   Marijuana use Counseled to quit smoking and  find alternative method of use if needed Praise provided that he has stopped smoking cigarettes   Prediabetes Recent A1c was 6 Will start sensitive scale SSI   Peripheral neuropathy Military-related issues Uses THC for this issue No longer  taking gabapentin   Prolonged QTc Will attempt to avoid QT-prolonging medications such as PPI, nausea meds, SSRIs   Gout Continue once daily allopurinol  Severe malnutrition Nutrition Problem: Severe Malnutrition Etiology: acute illness Signs/Symptoms: moderate fat depletion, moderate muscle depletion Interventions: Ensure Enlive (each supplement provides 350kcal and 20 grams of protein), Magic cup, MVI, Liberalize Diet          Consultants: Cardiology        Subjective:  Asking about going home   Objective: Vitals:   10/21/23 0740 10/21/23 0815  BP:  (!) 152/76  Pulse:    Resp:  16  Temp:  98.7 F (37.1 C)  SpO2: 94% 99%    Intake/Output Summary (Last 24 hours) at 10/21/2023 1147 Last data filed at 10/21/2023 0601 Gross per 24 hour  Intake 356 ml  Output 1000 ml  Net -644 ml   Filed Weights   10/16/23 0714  Weight: 68 kg    Exam:   General: Appearance:    Well developed, well nourished male in no acute distress     Lungs:     respirations unlabored  Heart:    Normal heart rate. Normal rhythm. No murmurs, rubs, or gallops.   MS:   All extremities are intact.   Neurologic:   Awake, alert    Data Reviewed: I have reviewed the patient's lab results since admission.  Pertinent labs for today include:  Cr: 1.05, hgb 10   Family Communication: None present  Disposition: Status is: Inpatient Remains inpatient appropriate because: ongoing management    Time spent: 50 minutes    Author: Joseph Art, DO 10/21/2023 11:47 AM  For on call review [email or web address].

## 2023-10-22 ENCOUNTER — Other Ambulatory Visit (HOSPITAL_COMMUNITY): Payer: Self-pay

## 2023-10-22 DIAGNOSIS — I483 Typical atrial flutter: Secondary | ICD-10-CM | POA: Diagnosis not present

## 2023-10-22 DIAGNOSIS — I214 Non-ST elevation (NSTEMI) myocardial infarction: Secondary | ICD-10-CM | POA: Diagnosis not present

## 2023-10-22 DIAGNOSIS — J189 Pneumonia, unspecified organism: Secondary | ICD-10-CM | POA: Diagnosis not present

## 2023-10-22 DIAGNOSIS — I5020 Unspecified systolic (congestive) heart failure: Secondary | ICD-10-CM | POA: Diagnosis not present

## 2023-10-22 LAB — CBC WITH DIFFERENTIAL/PLATELET
Abs Immature Granulocytes: 0.04 10*3/uL (ref 0.00–0.07)
Basophils Absolute: 0.1 10*3/uL (ref 0.0–0.1)
Basophils Relative: 1 %
Eosinophils Absolute: 0.2 10*3/uL (ref 0.0–0.5)
Eosinophils Relative: 2 %
HCT: 34.3 % — ABNORMAL LOW (ref 39.0–52.0)
Hemoglobin: 10.9 g/dL — ABNORMAL LOW (ref 13.0–17.0)
Immature Granulocytes: 0 %
Lymphocytes Relative: 16 %
Lymphs Abs: 1.8 10*3/uL (ref 0.7–4.0)
MCH: 28 pg (ref 26.0–34.0)
MCHC: 31.8 g/dL (ref 30.0–36.0)
MCV: 88.2 fL (ref 80.0–100.0)
Monocytes Absolute: 1.2 10*3/uL — ABNORMAL HIGH (ref 0.1–1.0)
Monocytes Relative: 11 %
Neutro Abs: 7.5 10*3/uL (ref 1.7–7.7)
Neutrophils Relative %: 70 %
Platelets: 220 10*3/uL (ref 150–400)
RBC: 3.89 MIL/uL — ABNORMAL LOW (ref 4.22–5.81)
RDW: 16.3 % — ABNORMAL HIGH (ref 11.5–15.5)
WBC: 10.7 10*3/uL — ABNORMAL HIGH (ref 4.0–10.5)
nRBC: 0 % (ref 0.0–0.2)

## 2023-10-22 LAB — BASIC METABOLIC PANEL
Anion gap: 11 (ref 5–15)
BUN: 29 mg/dL — ABNORMAL HIGH (ref 8–23)
CO2: 29 mmol/L (ref 22–32)
Calcium: 9.7 mg/dL (ref 8.9–10.3)
Chloride: 96 mmol/L — ABNORMAL LOW (ref 98–111)
Creatinine, Ser: 1.24 mg/dL (ref 0.61–1.24)
GFR, Estimated: 60 mL/min — ABNORMAL LOW (ref >60)
Glucose, Bld: 95 mg/dL (ref 70–99)
Potassium: 3.9 mmol/L (ref 3.5–5.1)
Sodium: 136 mmol/L (ref 135–145)

## 2023-10-22 LAB — GLUCOSE, CAPILLARY
Glucose-Capillary: 99 mg/dL (ref 70–99)
Glucose-Capillary: 135 mg/dL — ABNORMAL HIGH (ref 70–99)

## 2023-10-22 LAB — LIPOPROTEIN A (LPA): Lipoprotein (a): 13 nmol/L (ref <75.0)

## 2023-10-22 MED ORDER — IPRATROPIUM-ALBUTEROL 20-100 MCG/ACT IN AERS
1.0000 | INHALATION_SPRAY | Freq: Two times a day (BID) | RESPIRATORY_TRACT | Status: DC
  Filled 2023-10-22: qty 4

## 2023-10-22 MED ORDER — EMPAGLIFLOZIN 10 MG PO TABS
10.0000 mg | ORAL_TABLET | Freq: Every day | ORAL | 0 refills | Status: DC
Start: 2023-10-23 — End: 2023-11-24
  Filled 2023-10-22: qty 30, 30d supply, fill #0

## 2023-10-22 MED ORDER — TORSEMIDE 20 MG PO TABS
20.0000 mg | ORAL_TABLET | Freq: Every day | ORAL | Status: DC
  Administered 2023-10-22: 20 mg via ORAL
  Filled 2023-10-22: qty 1

## 2023-10-22 MED ORDER — TORSEMIDE 20 MG PO TABS
20.0000 mg | ORAL_TABLET | Freq: Every day | ORAL | 0 refills | Status: DC
  Filled 2023-10-22: qty 30, 30d supply, fill #0

## 2023-10-22 MED ORDER — AMIODARONE HCL 200 MG PO TABS
ORAL_TABLET | ORAL | 0 refills | Status: DC
  Filled 2023-10-22: qty 32, 31d supply, fill #0

## 2023-10-22 NOTE — Progress Notes (Signed)
 Discharge instructions reviewed with pt and his son, Rosanne Ashing.  Copy of instructions given to pt. [facility] has filled his scripts and will be picked up on his way out for discharge.  Pt will be d/c'd via wheelchair with belongings, with his son and will be             escorted by hospital volunteer.  Glenda Kunst,RN SWOT

## 2023-10-22 NOTE — Progress Notes (Signed)
 Cardiology Progress Note  Patient ID: Richard Rivers MRN: [id number] DOB: July 15, 1946 Date of Encounter: 10/22/2023 Primary Cardiologist: Rollene Rotunda, MD  Subjective   Chief Complaint: None.   HPI: Denies chest pain or shortness of breath.  Euvolemic on exam.  ROS:  All other ROS reviewed and negative. Pertinent positives noted in the HPI.     Telemetry  Overnight telemetry shows sinus rhythm 70s, which I personally reviewed.   Physical Exam   Vitals:   10/22/23 0341 10/22/23 0814 10/22/23 0815 10/22/23 0841  BP: (!) 149/56 (!) 153/65 (!) 153/65   Pulse: 67 70 70   Resp: 15 20    Temp: 98.5 F (36.9 C) 98.7 F (37.1 C)    TempSrc: Oral Oral    SpO2: 92%   94%  Weight:      Height:        Intake/Output Summary (Last 24 hours) at 10/22/2023 0930 Last data filed at 10/22/2023 0344 Gross per 24 hour  Intake 340 ml  Output 1825 ml  Net -1485 ml       10/16/2023    7:14 AM 09/16/2023    7:00 AM 09/15/2023    5:28 AM  Last 3 Weights  Weight (lbs) 150 lb 144 lb 4.8 oz 144 lb 13.5 oz  Weight (kg) 68.04 kg 65.454 kg 65.7 kg    Body mass index is 20.34 kg/m.  General: Well nourished, well developed, in no acute distress Head: Atraumatic, normal size  Eyes: PEERLA, EOMI  Neck: Supple, no JVD Endocrine: No thryomegaly Cardiac: Normal S1, S2; RRR; no murmurs, rubs, or gallops Lungs: Diminished breath sounds bilaterally Abd: Soft, nontender, no hepatomegaly  Ext: No edema, pulses 2+ Musculoskeletal: No deformities, BUE and BLE strength normal and equal Skin: Warm and dry, no rashes   Neuro: Alert and oriented to person, place, time, and situation, CNII-XII grossly intact, no focal deficits  Psych: Normal mood and affect   Cardiac Studies  [facility] 10/20/2023 1.  Patent left main stent extending into the ostial left circumflex with moderate in-stent restenosis.  Chronically occluded right coronary artery with left-to-right collaterals. 2.  Left ventricular angiography  was not performed.  EF was moderately reduced by echo. 3.  Right heart catheterization showed moderately elevated wedge pressure, moderate pulmonary hypertension and normal cardiac output.  TTE 10/17/2023  1. Left ventricular ejection fraction, by estimation, is 35 to 40%. The  left ventricle has moderately decreased function. The left ventricle  demonstrates global hypokinesis. Left ventricular diastolic parameters are  consistent with Grade I diastolic  dysfunction (impaired relaxation).   2. Right ventricular systolic function is normal. The right ventricular  size is normal. There is mildly elevated pulmonary artery systolic  pressure. The estimated right ventricular systolic pressure is 36.1 mmHg.   3. The mitral valve is grossly normal. Trivial mitral valve  regurgitation. No evidence of mitral stenosis.   4. The aortic valve is tricuspid. Aortic valve regurgitation is not  visualized. No aortic stenosis is present.   5. The inferior vena cava is dilated in size with >50% respiratory  variability, suggesting right atrial pressure of 8 mmHg.   Patient Profile  Richard Rivers is a 78 y.o. male with CAD status post left main PCI in 2023, CTO of RCA, moderate LAD and OM disease, systolic heart failure and (35-40%), PAD status post left CEA and right femoropopliteal bypass, COPD, hypertension, hyperlipidemia who was admitted on 10/16/2023 for atrial fib with RVR and non-STEMI.   Assessment &  Plan   # Non-STEMI # Moderate in-stent restenosis of left main stent -Admitted with rapid A-fib.  Suffered a secondary demand ischemic event.  Left heart cath with in-stent restenosis of left main stent intervention was deferred by the interventional team.  They have recommended medical management. -Continue Plavix and Xarelto.  He was on this at home. -Continue beta-blocker and Imdur and high intensity statin. -He has complex CAD.  Best management is medication. -Would recommend to discharge on 30  mg of Imdur daily.  # Persistent A-fib -Has converted to sinus rhythm.  Plan for rhythm control strategy in the setting of systolic dysfunction.  He should complete 7 days of 400 mg of amiodarone twice daily followed by 200 mg daily. -Can consider outpatient ablation versus continue amiodarone. -Continue Xarelto  # Acute on chronic systolic heart failure, EF 35-40% -Diuresed yesterday.  Euvolemic on exam.  Would recommend discharge on 20 mg of torsemide daily. -Should continue carvedilol 12.5 mg twice daily, Entresto 24-26 mg twice daily, Jardiance 10 mg daily at discharge. -He had hyperkalemia on admission.  Would recommend to hold Aldactone moving forward.  I discussed this with the patient.  # AKI # Hyperkalemia -Resolved. -Would avoid Aldactone moving forward.  # Influenza A # Multifocal pneumonia # COPD -Per hospital team  [facility] will sign off.   Medication Recommendations: As above Other recommendations (labs, testing, etc): None Follow up as an outpatient: He follows at the [facility].  He will see [name] back as needed.  For questions or updates, please contact [facility] Please consult [email or web address] for contact info under         Signed, Gerri Spore T. Flora Lipps, MD, [facility] [place]  [facility]  10/22/2023 9:30 AM

## 2023-10-22 NOTE — Discharge Summary (Signed)
 Physician Discharge Summary  Richard Rivers ZOX:[id number] DOB: 10/17/1945 DOA: 10/16/2023  PCP: System, Provider Not In  Admit date: 10/16/2023 Discharge date: 10/22/2023  Admitted From: home Discharge disposition: home   Recommendations for Outpatient Follow-Up:   Close cards follow up at the [facility] Cbc/bmp 1 week Home health complete 7 days of 400 mg of amiodarone twice daily followed by 200 mg daily.  Can consider outpatient ablation versus continue amiodarone.   Discharge Diagnosis:   Principal Problem:   Multifocal pneumonia Active Problems:   Hypertension   HLD (hyperlipidemia)   Gout   Depression with anxiety   PAD (peripheral artery disease) (HCC)   Neuropathy   HFrEF (heart failure with reduced ejection fraction) (HCC)   NSTEMI (non-ST elevated myocardial infarction) (HCC)   Atrial flutter (HCC)   COPD (chronic obstructive pulmonary disease) (HCC)   Marijuana use   Prolonged QT interval   Hyperkalemia   Protein-calorie malnutrition, severe    Discharge Condition: Improved.  Diet recommendation: Low sodium, heart healthy.  Carbohydrate-modified.   Wound care: None.  Code status: Full.   History of Present Illness:  Richard Rivers is a 78 y.o. male with CAD status post left main PCI in 2023, CTO of RCA, moderate LAD and OM disease, systolic heart failure and (35-40%), PAD status post left CEA and right femoropopliteal bypass, COPD, hypertension, hyperlipidemia who was admitted on 10/16/2023 for atrial fib with RVR and non-STEMI.     Hospital Course by Problem:   Multilobar PNA Patient presenting with nonproductive cough, chest discomfort, and multifocal infiltrates on chest x-ray and subsequent chest CT This appears to be most likely community-acquired pneumonia; he was previously positive for influenza and is still testing positive, but this is a lesser consideration Blood cultures-- NGTD -finished abx PRN nebs   Non-ST elevation MI   Patient declined to have cardiac cath during prior hospitalization and cards recommended medical management Limited echocardiogram similar to one from 09/03/2023.  EF 35 to 40%.  Repeat without WMA. On Plavix and Xarelto at home; transitioned to heparin here but now holding this per cardiology given worsening anemia Continue Imdur, carvedilol Heart cath 2/19: Recommend medical therapy for coronary artery disease. Suspect elevated troponin is due to supply demand ischemia.     Anemia -had nose bleed prior to admission -hgb stable   Chronic combined heart failure Presented with shortness of breath with minimal activity Known EF of 35 to 40% No obvious volume overload clinically or on imaging at this time No longer taking home Lasix Hold Entresto in the setting of hyperK+, worsening renal function -IV lasix per cards -home in AM?   Atrial flutter EKG with persistent flutter that appeared to be Vtach Also with LBBB Monitor currently appears to show NSR Cardiology consulted and started PO amiodarone   Hyperkalemia -resolved   COPD  Recently completed Tamiflu for 5 days; positive test is unlikely to be active infection No evidence of decompensation from this standpoint currently He will continue to use albuterol as needed Continue Mucinex, Combivent    Essential hypertension  Continue carvedilol   Peripheral artery disease  Status post right femoral peroneal bypass in 2023 Continue statin   Stage 3a CKD Appears to be stable at this time - while worse than yesterday, he appears to have stage 3a CKD at baseline -daily labs   HLD -statin   Depression and anxiety Continue buspirone, hydroxyzine, and trazodone   Marijuana use Counseled to quit smoking and  find alternative method of use if needed Praise provided that he has stopped smoking cigarettes   Prediabetes Recent A1c was 6 -resume home meds   Peripheral neuropathy Military-related issues Uses THC for this  issue   Prolonged QTc Will attempt to avoid QT-prolonging medications    Gout Continue once daily allopurinol   Severe malnutrition Nutrition Problem: Severe Malnutrition Etiology: acute illness Signs/Symptoms: moderate fat depletion, moderate muscle depletion Interventions: Ensure Enlive (each supplement provides 350kcal and 20 grams of protein), Magic cup, MVI, Liberalize Diet         Medical Consultants:      Discharge Exam:   Vitals:   10/22/23 0815 10/22/23 0841  BP: (!) 153/65   Pulse: 70   Resp:    Temp:    SpO2:  94%   Vitals:   10/22/23 0341 10/22/23 0814 10/22/23 0815 10/22/23 0841  BP: (!) 149/56 (!) 153/65 (!) 153/65   Pulse: 67 70 70   Resp: 15 20    Temp: 98.5 F (36.9 C) 98.7 F (37.1 C)    TempSrc: Oral Oral    SpO2: 92% 92%  94%  Weight:      Height:        General exam: Appears calm and comfortable.   The results of significant diagnostics from this hospitalization (including imaging, microbiology, ancillary and laboratory) are listed below for reference.     Procedures and Diagnostic Studies:   ECHOCARDIOGRAM COMPLETE Result Date: 10/17/2023    ECHOCARDIOGRAM REPORT   Patient Name:   Richard Rivers Date of Exam: 10/17/2023 Medical Rec #:  [id number]        Height:       72.0 in Accession #:    [id number]       Weight:       150.0 lb Date of Birth:  02/15/1946        BSA:          1.885 m Patient Age:    34 years         BP:           126/64 mmHg Patient Gender: M                HR:           75 bpm. Exam Location:  Inpatient Procedure: 2D Echo, Color Doppler and Cardiac Doppler (Both Spectral and Color            Flow Doppler were utilized during procedure). Indications:    NSTEMI  History:        Patient has prior history of Echocardiogram examinations, most                 recent 09/13/2023. CAD, COPD; Risk Factors:Hypertension and                 Dyslipidemia.  Sonographer:    Irving Burton Senior RDCS Referring Phys: [id number] JENNIFER YATES IMPRESSIONS   1. Left ventricular ejection fraction, by estimation, is 35 to 40%. The left ventricle has moderately decreased function. The left ventricle demonstrates global hypokinesis. Left ventricular diastolic parameters are consistent with Grade I diastolic dysfunction (impaired relaxation).  2. Right ventricular systolic function is normal. The right ventricular size is normal. There is mildly elevated pulmonary artery systolic pressure. The estimated right ventricular systolic pressure is 36.1 mmHg.  3. The mitral valve is grossly normal. Trivial mitral valve regurgitation. No evidence of mitral stenosis.  4. The aortic valve is tricuspid. Aortic valve  regurgitation is not visualized. No aortic stenosis is present.  5. The inferior vena cava is dilated in size with >50% respiratory variability, suggesting right atrial pressure of 8 mmHg. Comparison(s): No significant change from prior study. FINDINGS  Left Ventricle: Left ventricular ejection fraction, by estimation, is 35 to 40%. The left ventricle has moderately decreased function. The left ventricle demonstrates global hypokinesis. Strain imaging was not performed. The left ventricular internal cavity size was normal in size. There is no left ventricular hypertrophy. Left ventricular diastolic parameters are consistent with Grade I diastolic dysfunction (impaired relaxation). Right Ventricle: The right ventricular size is normal. No increase in right ventricular wall thickness. Right ventricular systolic function is normal. There is mildly elevated pulmonary artery systolic pressure. The tricuspid regurgitant velocity is 2.65  m/s, and with an assumed right atrial pressure of 8 mmHg, the estimated right ventricular systolic pressure is 36.1 mmHg. Left Atrium: Left atrial size was normal in size. Right Atrium: Right atrial size was normal in size. Pericardium: Trivial pericardial effusion is present. Mitral Valve: The mitral valve is grossly normal. Trivial mitral  valve regurgitation. No evidence of mitral valve stenosis. Tricuspid Valve: The tricuspid valve is normal in structure. Tricuspid valve regurgitation is mild . No evidence of tricuspid stenosis. Aortic Valve: The aortic valve is tricuspid. Aortic valve regurgitation is not visualized. No aortic stenosis is present. Pulmonic Valve: The pulmonic valve was grossly normal. Pulmonic valve regurgitation is trivial. No evidence of pulmonic stenosis. Aorta: The aortic root is normal in size and structure. Venous: The inferior vena cava is dilated in size with greater than 50% respiratory variability, suggesting right atrial pressure of 8 mmHg. IAS/Shunts: No atrial level shunt detected by color flow Doppler. Additional Comments: 3D imaging was not performed.  LEFT VENTRICLE PLAX 2D LVIDd:         5.20 cm      Diastology LVIDs:         4.00 cm      LV e' medial:    4.57 cm/s LV PW:         1.00 cm      LV E/e' medial:  15.6 LV IVS:        0.90 cm      LV e' lateral:   8.70 cm/s LVOT diam:     1.90 cm      LV E/e' lateral: 8.2 LV SV:         50 LV SV Index:   26 LVOT Area:     2.84 cm  LV Volumes (MOD) LV vol d, MOD A2C: 98.1 ml LV vol d, MOD A4C: 166.0 ml LV vol s, MOD A2C: 64.5 ml LV vol s, MOD A4C: 105.0 ml LV SV MOD A2C:     33.6 ml LV SV MOD A4C:     166.0 ml LV SV MOD BP:      44.5 ml RIGHT VENTRICLE RV S prime:     13.80 cm/s TAPSE (M-mode): 2.0 cm LEFT ATRIUM             Index        RIGHT ATRIUM           Index LA diam:        3.60 cm 1.91 cm/m   RA Area:     14.70 cm LA Vol (A2C):   34.0 ml 18.03 ml/m  RA Volume:   36.70 ml  19.47 ml/m LA Vol (A4C):   53.3 ml 28.27 ml/m LA  Biplane Vol: 44.1 ml 23.39 ml/m  AORTIC VALVE LVOT Vmax:   82.90 cm/s LVOT Vmean:  56.900 cm/s LVOT VTI:    0.175 m  AORTA Ao Root diam: 3.40 cm MITRAL VALVE               TRICUSPID VALVE MV Area (PHT): 3.46 cm    TR Peak grad:   28.1 mmHg MV Decel Time: 219 msec    TR Vmax:        265.00 cm/s MV E velocity: 71.20 cm/s MV A velocity: 84.50  cm/s  SHUNTS MV E/A ratio:  0.84        Systemic VTI:  0.18 m                            Systemic Diam: 1.90 cm Lennie Odor MD Electronically signed by Lennie Odor MD Signature Date/Time: 10/17/2023/1:45:43 PM    Final    CT CHEST WO CONTRAST Result Date: 10/17/2023 CLINICAL DATA:  78 year old male with history of pneumonia. EXAM: CT CHEST WITHOUT CONTRAST TECHNIQUE: Multidetector CT imaging of the chest was performed following the standard protocol without IV contrast. RADIATION DOSE REDUCTION: This exam was performed according to the departmental dose-optimization program which includes automated exposure control, adjustment of the mA and/or kV according to patient size and/or use of iterative reconstruction technique. COMPARISON:  CT of the chest 09/02/2023. FINDINGS: Cardiovascular: Heart size is normal. There is no significant pericardial fluid, thickening or pericardial calcification. There is aortic atherosclerosis, as well as atherosclerosis of the great vessels of the mediastinum and the coronary arteries, including calcified atherosclerotic plaque in the left main, left anterior descending, left circumflex and right coronary arteries. Mediastinum/Nodes: No pathologically enlarged mediastinal or hilar lymph nodes. Please note that accurate exclusion of hilar adenopathy is limited on noncontrast CT scans. Esophagus is unremarkable in appearance. No axillary lymphadenopathy. Lungs/Pleura: Small left and trace right pleural effusions lying dependently. Diffuse bronchial wall thickening with widespread thickening of the peribronchovascular interstitium and some patchy areas of peribronchovascular ground-glass attenuation, micro and macronodularity, increased compared to the prior study. The largest of these nodules are very ill-defined and irregular in appearance, measuring up to 2.0 x 1.4 cm in the left lower lobe (axial image 148 of series 4), likely of infectious or inflammatory etiology given the  rapid development compared to the prior study. Mild centrilobular and paraseptal emphysema. Upper Abdomen: Aortic atherosclerosis. Low-attenuation lesions in the right kidney, incompletely characterized on today's noncontrast CT examination, but statistically likely to represent cysts (no imaging follow-up recommended) measuring up to 2.6 cm. Musculoskeletal: There are no aggressive appearing lytic or blastic lesions noted in the visualized portions of the skeleton. IMPRESSION: 1. The appearance of the lungs is most suggestive of acute bronchitis and multilobar bilateral bronchopneumonia, as above. 2. Small left and trace right pleural effusions. 3. Mild centrilobular and paraseptal emphysema. 4. Aortic atherosclerosis, in addition to left main and three-vessel coronary artery disease. Aortic Atherosclerosis (ICD10-I70.0) and Emphysema (ICD10-J43.9). Electronically Signed   By: Trudie Reed M.D.   On: 10/17/2023 10:53   DG Chest Portable 1 View Result Date: 10/16/2023 CLINICAL DATA:  Shortness of breath, chest pain EXAM: PORTABLE CHEST 1 VIEW COMPARISON:  09/12/2023 FINDINGS: Patchy pulmonary opacity greatest at the left base. No Kerley lines, significant effusion, or pneumothorax. No cardiac enlargement, mediastinal contours distorted by rotation. IMPRESSION: Patchy infiltrate in the lower lungs greater on the left, usually from pneumonia. Electronically Signed  By: Tiburcio Pea M.D.   On: 10/16/2023 07:32     Labs:   Basic Metabolic Panel: Recent Labs  Lab 10/16/23 0653 10/16/23 0657 10/18/23 0302 10/19/23 0405 10/20/23 0647 10/20/23 1045 10/20/23 1049 10/21/23 0034 10/22/23 0320  NA 133*   < > 135 138 135 138 138 138 136  K 5.6*   < > 4.7 4.3 4.3 4.5 4.4 4.4 3.9  CL 101   < > 101 102 101  --   --  101 96*  CO2 21*   < > 27 26 26   --   --  27 29  GLUCOSE 317*   < > 81 76 114*  --   --  95 95  BUN 19   < > 30* 28* 30*  --   --  26* 29*  CREATININE 1.21   < > 1.46* 1.28* 1.28*  --    --  1.05 1.24  CALCIUM 10.1   < > 9.0 9.2 9.5  --   --  9.6 9.7  MG 1.7  --   --   --   --   --   --   --   --    < > = values in this interval not displayed.   GFR Estimated Creatinine Clearance: 47.2 mL/min (by C-G formula based on SCr of 1.24 mg/dL). Liver Function Tests: Recent Labs  Lab 10/16/23 0653  AST 19  ALT 11  ALKPHOS 103  BILITOT 0.4  PROT 7.6  ALBUMIN 3.1*   No results for input(s): "LIPASE", "AMYLASE" in the last 168 hours. No results for input(s): "AMMONIA" in the last 168 hours. Coagulation profile Recent Labs  Lab 10/16/23 0701  INR 2.3*    CBC: Recent Labs  Lab 10/16/23 0653 10/16/23 0657 10/18/23 0302 10/18/23 2039 10/19/23 0405 10/20/23 0647 10/20/23 1045 10/20/23 1049 10/21/23 0034 10/22/23 0320  WBC 15.6*   < > 9.6  --  9.4 10.5  --   --  9.8 10.7*  NEUTROABS 10.7*  --   --   --  6.6 7.4  --   --  6.6 7.5  HGB 10.7*   < > 7.1*   < > 9.8* 10.1* 10.9* 10.9* 10.0* 10.9*  HCT 35.4*   < > 22.9*   < > 30.8* 31.7* 32.0* 32.0* 31.0* 34.3*  MCV 93.2   < > 89.8  --  90.1 88.5  --   --  88.8 88.2  PLT 393   < > 227  --  204 202  --   --  205 220   < > = values in this interval not displayed.   Cardiac Enzymes: No results for input(s): "CKTOTAL", "CKMB", "CKMBINDEX", "TROPONINI" in the last 168 hours. BNP: Invalid input(s): "POCBNP" CBG: Recent Labs  Lab 10/21/23 1257 10/21/23 1540 10/21/23 2123 10/22/23 0614 10/22/23 0858  GLUCAP 132* 103* 122* 99 135*   D-Dimer No results for input(s): "DDIMER" in the last 72 hours. Hgb A1c No results for input(s): "HGBA1C" in the last 72 hours. Lipid Profile No results for input(s): "CHOL", "HDL", "LDLCALC", "TRIG", "CHOLHDL", "LDLDIRECT" in the last 72 hours. Thyroid function studies No results for input(s): "TSH", "T4TOTAL", "T3FREE", "THYROIDAB" in the last 72 hours.  Invalid input(s): "FREET3" Anemia work up No results for input(s): "VITAMINB12", "FOLATE", "FERRITIN", "TIBC", "IRON",  "RETICCTPCT" in the last 72 hours. Microbiology Recent Results (from the past 240 hours)  Blood Culture (routine x 2)     Status: None  Collection Time: 10/16/23  6:57 AM   Specimen: BLOOD  Result Value Ref Range Status   Specimen Description BLOOD RIGHT ANTECUBITAL  Final   Special Requests   Final    BOTTLES DRAWN AEROBIC AND ANAEROBIC Blood Culture results may not be optimal due to an inadequate volume of blood received in culture bottles   Culture   Final    NO GROWTH 5 DAYS Performed at [facility], [address], [place], [place] [address]    Report Status 10/21/2023 FINAL  Final  Blood Culture (routine x 2)     Status: None   Collection Time: 10/16/23  7:02 AM   Specimen: BLOOD LEFT WRIST  Result Value Ref Range Status   Specimen Description BLOOD LEFT WRIST  Final   Special Requests   Final    BOTTLES DRAWN AEROBIC AND ANAEROBIC Blood Culture results may not be optimal due to an inadequate volume of blood received in culture bottles   Culture   Final    NO GROWTH 5 DAYS Performed at [facility], [address], [place], [place] [address]    Report Status 10/21/2023 FINAL  Final  Resp panel by RT-PCR (RSV, Flu A&B, Covid) Anterior Nasal Swab     Status: Abnormal   Collection Time: 10/16/23  7:29 AM   Specimen: Anterior Nasal Swab  Result Value Ref Range Status   SARS Coronavirus 2 by RT PCR NEGATIVE NEGATIVE Final   Influenza A by PCR POSITIVE (A) NEGATIVE Final   Influenza B by PCR NEGATIVE NEGATIVE Final    Comment: (NOTE) The Xpert Xpress SARS-CoV-2/FLU/RSV plus assay is intended as an aid in the diagnosis of influenza from Nasopharyngeal swab specimens and should not be used as a sole basis for treatment. Nasal washings and aspirates are unacceptable for Xpert Xpress SARS-CoV-2/FLU/RSV testing.  Fact Sheet for Patients: BloggerCourse.com  Fact Sheet for Healthcare  Providers: SeriousBroker.it  This test is not yet approved or cleared by the [place] FDA and has been authorized for detection and/or diagnosis of SARS-CoV-2 by FDA under an Emergency Use Authorization (EUA). This EUA will remain in effect (meaning this test can be used) for the duration of the COVID-19 declaration under Section 564(b)(1) of the Act, 21 U.S.C. section 360bbb-3(b)(1), unless the authorization is terminated or revoked.     Resp Syncytial Virus by PCR NEGATIVE NEGATIVE Final    Comment: (NOTE) Fact Sheet for Patients: BloggerCourse.com  Fact Sheet for Healthcare Providers: SeriousBroker.it  This test is not yet approved or cleared by the [place] FDA and has been authorized for detection and/or diagnosis of SARS-CoV-2 by FDA under an Emergency Use Authorization (EUA). This EUA will remain in effect (meaning this test can be used) for the duration of the COVID-19 declaration under Section 564(b)(1) of the Act, 21 U.S.C. section 360bbb-3(b)(1), unless the authorization is terminated or revoked.  Performed at [facility], [address], [place], [place] [address]      Discharge Instructions:   Discharge Instructions     Amb Referral to Cardiac Rehabilitation   Complete by: As directed    Diagnosis: NSTEMI   After initial evaluation and assessments completed: Virtual Based Care may be provided alone or in conjunction with Phase 2 Cardiac Rehab based on patient barriers.: Yes   Intensive Cardiac Rehabilitation (ICR) [facility] location only OR Traditional Cardiac Rehabilitation (TCR) *If criteria for ICR are not met will enroll in TCR [facility] only): Yes   Diet -  low sodium heart healthy   Complete by: As directed    Increase activity slowly   Complete by: As directed       Allergies as of 10/22/2023       Reactions   Codeine Itching, Swelling   Throat swelling   Penicillin  G Swelling   Throat        Medication List     STOP taking these medications    colchicine 0.6 MG tablet   docusate sodium 50 MG capsule Commonly known as: COLACE   furosemide 20 MG tablet Commonly known as: Lasix   hydrOXYzine 25 MG tablet Commonly known as: ATARAX       TAKE these medications    acetaminophen 325 MG tablet Commonly known as: TYLENOL Take 650 mg by mouth 3 (three) times daily as needed.   allopurinol 300 MG tablet Commonly known as: ZYLOPRIM Take 300 mg by mouth daily.   amiodarone 200 MG tablet Commonly known as: PACERONE Take 1 tablet (200 mg total) by mouth 2 (two) times daily for 1 day, THEN 1 tablet (200 mg total) daily. Start taking on: October 22, 2023   ammonium lactate 12 % lotion Commonly known as: LAC-HYDRIN Apply 1 Application topically 2 (two) times daily as needed. APPLY TO DRY SKIN ON FEET   atorvastatin 80 MG tablet Commonly known as: LIPITOR Take 80 mg by mouth at bedtime.   busPIRone 10 MG tablet Commonly known as: BUSPAR Take 10 mg by mouth 2 (two) times daily as needed.   carvedilol 12.5 MG tablet Commonly known as: COREG Take 12.5 mg by mouth 2 (two) times daily with a meal.   citalopram 10 MG tablet Commonly known as: CELEXA Take 10 mg by mouth daily.   clopidogrel 75 MG tablet Commonly known as: PLAVIX Take 1 tablet (75 mg total) by mouth daily.   Combivent Respimat 20-100 MCG/ACT Aers respimat Generic drug: Ipratropium-Albuterol Inhale 1 puff into the lungs 4 (four) times daily.   Entresto 24-26 MG Generic drug: sacubitril-valsartan Take 1 tablet by mouth 2 (two) times daily.   famotidine 20 MG tablet Commonly known as: PEPCID Take 20 mg by mouth daily as needed for heartburn or indigestion.   gabapentin 300 MG capsule Commonly known as: NEURONTIN Take 600 mg by mouth 2 (two) times daily.   guaiFENesin 600 MG 12 hr tablet Commonly known as: MUCINEX Take 600 mg by mouth every 12 (twelve) hours  as needed for cough.   isosorbide mononitrate 30 MG 24 hr tablet Commonly known as: IMDUR Take 1 tablet (30 mg total) by mouth daily.   Jardiance 10 MG Tabs tablet Generic drug: empagliflozin Take 1 tablet (10 mg total) by mouth daily. Start taking on: October 23, 2023   nitroGLYCERIN 0.4 MG SL tablet Commonly known as: NITROSTAT Place 1 tablet (0.4 mg total) under the tongue every 5 (five) minutes x 3 doses as needed for chest pain.   rivaroxaban 20 MG Tabs tablet Commonly known as: XARELTO Take 20 mg by mouth daily with supper.   torsemide 20 MG tablet Commonly known as: DEMADEX Take 1 tablet (20 mg total) by mouth daily. Start taking on: October 23, 2023   traZODone 50 MG tablet Commonly known as: DESYREL Take 1-3 tablets by mouth at bedtime. Take 2 tablets at night        Follow-up Information     [facility] Follow up in 1 week(s).   Contact information: [address] [employer]  [place] [address] [phone]        Care, [facility] Follow up.   Specialty: Home Health Services Why: HHPT arranged- they will seek [facility] through [facility]- Contact information: [address] [place] [address] [phone]                  Time coordinating discharge: 45 min  Signed:  Joseph Art DO  [facility] 10/22/2023, 12:05 PM

## 2023-10-22 NOTE — TOC Transition Note (Signed)
 Transition of Care (TOC) - Discharge Note Donn Pierini RN, BSN Transitions of Care Unit 4E- RN Case Manager See Treatment Team for direct phone #   Patient Details  Name: Richard Rivers MRN: [id number] Date of Birth: 1946/08/16  Transition of Care [facility]) CM/SW Contact:  Darrold Span, RN Phone Number: 10/22/2023, 11:51 AM   Clinical Narrative:    Pt stable for transition home today, Order placed for HHPT.   CM in to speak with pt at bedside, list provided for [facility] choice Per CMS guidelines from [email or web address] website with star ratings (copy placed in shadow chart)- per pt he has done outpt therapy in past but not HH- reviewed list and has selected [facility] as first choice. Pt states he would like to use his VA benefits for [facility] needs. Explained that there will be a delay in start of care as the VA primary care will need to give auth for the [facility] services- pt voiced understanding and states he still wants to use his VA benefits (also has [employer] and [employer]).  Pt voiced his PCP is at the [facility]- Dr. Elby Showers.   Pt states he has needed DME at home including RW and power chair. He also voiced that he has an aide that comes through the [place] w/ [facility].   Family at bedside to transport home.   Call made to [facility] for HHPT referral- referral accepted- pending VA auth. CM called [facility] [place] clinic [phone]) and received fax for PCP to request [facility] auth- [facility] orders faxed to the PCP at [facility] clinic at fax#- [phone].        Final next level of care: Home w Home Health Services Barriers to Discharge: Barriers Resolved   Patient Goals and CMS Choice Patient states their goals for this hospitalization and ongoing recovery are:: return home CMS Medicare.gov Compare Post Acute Care list provided to:: Patient Choice offered to / list presented to : Patient      Discharge Placement               Home w/ [facility]         Discharge Plan and Services Additional resources added to the After Visit Summary for     Discharge Planning Services: CM Consult Post Acute Care Choice: Home Health          DME Arranged: N/A DME Agency: NA       HH Arranged: PT HH Agency: [facility] Date [facility] Agency Contacted: 10/22/23 Time HH Agency Contacted: 1151 Representative spoke with at [facility] Agency: Kandee Keen  Social Drivers of Health (SDOH) Interventions SDOH Screenings   Food Insecurity: No Food Insecurity (10/16/2023)  Housing: Low Risk  (10/16/2023)  Transportation Needs: No Transportation Needs (10/16/2023)  Utilities: Not At Risk (10/16/2023)  Alcohol Screen: Low Risk  (09/13/2023)  Financial Resource Strain: Low Risk  (09/13/2023)  Social Connections: Socially Isolated (10/16/2023)  Tobacco Use: High Risk (10/16/2023)     Readmission Risk Interventions    10/22/2023   11:51 AM  Readmission Risk Prevention Plan  Transportation Screening Complete  Medication Review (RN Care Manager) Complete  HRI or Home Care Consult Complete  SW Recovery Care/Counseling Consult Complete  Palliative Care Screening Not Applicable  Skilled Nursing Facility Not Applicable

## 2023-10-23 DIAGNOSIS — I6529 Occlusion and stenosis of unspecified carotid artery: Secondary | ICD-10-CM | POA: Insufficient documentation
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
## 2023-10-25 ENCOUNTER — Encounter (INDEPENDENT_AMBULATORY_CARE_PROVIDER_SITE_OTHER): Payer: Non-veteran care | Admitting: Vascular Surgery

## 2023-10-25 DIAGNOSIS — I25119 Atherosclerotic heart disease of native coronary artery with unspecified angina pectoris: Secondary | ICD-10-CM

## 2023-10-25 DIAGNOSIS — I6529 Occlusion and stenosis of unspecified carotid artery: Secondary | ICD-10-CM

## 2023-10-25 DIAGNOSIS — I739 Peripheral vascular disease, unspecified: Secondary | ICD-10-CM

## 2023-10-25 DIAGNOSIS — E785 Hyperlipidemia, unspecified: Secondary | ICD-10-CM

## 2023-10-25 DIAGNOSIS — I10 Essential (primary) hypertension: Secondary | ICD-10-CM

## 2023-11-24 ENCOUNTER — Emergency Department (HOSPITAL_COMMUNITY)

## 2023-11-24 ENCOUNTER — Encounter (HOSPITAL_COMMUNITY): Payer: Self-pay | Admitting: Emergency Medicine

## 2023-11-24 ENCOUNTER — Inpatient Hospital Stay (HOSPITAL_COMMUNITY)
Admission: EM | Admit: 2023-11-24 | Discharge: 2023-11-27 | DRG: 280 | Disposition: A | Discharge status: Home / Self Care | Attending: Internal Medicine | Admitting: Internal Medicine

## 2023-11-24 ENCOUNTER — Other Ambulatory Visit: Payer: Self-pay

## 2023-11-24 DIAGNOSIS — I5023 Acute on chronic systolic (congestive) heart failure: Secondary | ICD-10-CM | POA: Diagnosis present

## 2023-11-24 DIAGNOSIS — I252 Old myocardial infarction: Secondary | ICD-10-CM

## 2023-11-24 DIAGNOSIS — Z955 Presence of coronary angioplasty implant and graft: Secondary | ICD-10-CM

## 2023-11-24 DIAGNOSIS — Z951 Presence of aortocoronary bypass graft: Secondary | ICD-10-CM | POA: Diagnosis not present

## 2023-11-24 DIAGNOSIS — R000 Tachycardia, unspecified: Secondary | ICD-10-CM | POA: Diagnosis present

## 2023-11-24 DIAGNOSIS — Z7901 Long term (current) use of anticoagulants: Secondary | ICD-10-CM

## 2023-11-24 DIAGNOSIS — M109 Gout, unspecified: Secondary | ICD-10-CM | POA: Diagnosis present

## 2023-11-24 DIAGNOSIS — Z9981 Dependence on supplemental oxygen: Secondary | ICD-10-CM | POA: Diagnosis not present

## 2023-11-24 DIAGNOSIS — I509 Heart failure, unspecified: Principal | ICD-10-CM

## 2023-11-24 DIAGNOSIS — Z880 Allergy status to penicillin: Secondary | ICD-10-CM

## 2023-11-24 DIAGNOSIS — E785 Hyperlipidemia, unspecified: Secondary | ICD-10-CM | POA: Diagnosis present

## 2023-11-24 DIAGNOSIS — I739 Peripheral vascular disease, unspecified: Secondary | ICD-10-CM | POA: Diagnosis present

## 2023-11-24 DIAGNOSIS — I4819 Other persistent atrial fibrillation: Secondary | ICD-10-CM | POA: Diagnosis present

## 2023-11-24 DIAGNOSIS — Z1152 Encounter for screening for COVID-19: Secondary | ICD-10-CM | POA: Diagnosis not present

## 2023-11-24 DIAGNOSIS — Z7951 Long term (current) use of inhaled steroids: Secondary | ICD-10-CM

## 2023-11-24 DIAGNOSIS — J441 Chronic obstructive pulmonary disease with (acute) exacerbation: Secondary | ICD-10-CM | POA: Diagnosis present

## 2023-11-24 DIAGNOSIS — I130 Hypertensive heart and chronic kidney disease with heart failure and stage 1 through stage 4 chronic kidney disease, or unspecified chronic kidney disease: Principal | ICD-10-CM | POA: Diagnosis present

## 2023-11-24 DIAGNOSIS — Z7984 Long term (current) use of oral hypoglycemic drugs: Secondary | ICD-10-CM

## 2023-11-24 DIAGNOSIS — I272 Pulmonary hypertension, unspecified: Secondary | ICD-10-CM | POA: Diagnosis present

## 2023-11-24 DIAGNOSIS — N179 Acute kidney failure, unspecified: Secondary | ICD-10-CM | POA: Diagnosis not present

## 2023-11-24 DIAGNOSIS — E875 Hyperkalemia: Secondary | ICD-10-CM | POA: Diagnosis present

## 2023-11-24 DIAGNOSIS — N182 Chronic kidney disease, stage 2 (mild): Secondary | ICD-10-CM | POA: Diagnosis present

## 2023-11-24 DIAGNOSIS — R234 Changes in skin texture: Secondary | ICD-10-CM | POA: Diagnosis present

## 2023-11-24 DIAGNOSIS — D631 Anemia in chronic kidney disease: Secondary | ICD-10-CM | POA: Diagnosis present

## 2023-11-24 DIAGNOSIS — E43 Unspecified severe protein-calorie malnutrition: Secondary | ICD-10-CM

## 2023-11-24 DIAGNOSIS — T82855A Stenosis of coronary artery stent, initial encounter: Secondary | ICD-10-CM | POA: Diagnosis present

## 2023-11-24 DIAGNOSIS — J9610 Chronic respiratory failure, unspecified whether with hypoxia or hypercapnia: Secondary | ICD-10-CM | POA: Diagnosis present

## 2023-11-24 DIAGNOSIS — D72829 Elevated white blood cell count, unspecified: Secondary | ICD-10-CM | POA: Diagnosis present

## 2023-11-24 DIAGNOSIS — Z7409 Other reduced mobility: Secondary | ICD-10-CM | POA: Diagnosis present

## 2023-11-24 DIAGNOSIS — I2510 Atherosclerotic heart disease of native coronary artery without angina pectoris: Secondary | ICD-10-CM | POA: Diagnosis present

## 2023-11-24 DIAGNOSIS — Y831 Surgical operation with implant of artificial internal device as the cause of abnormal reaction of the patient, or of later complication, without mention of misadventure at the time of the procedure: Secondary | ICD-10-CM | POA: Diagnosis present

## 2023-11-24 DIAGNOSIS — J449 Chronic obstructive pulmonary disease, unspecified: Secondary | ICD-10-CM

## 2023-11-24 DIAGNOSIS — Z79899 Other long term (current) drug therapy: Secondary | ICD-10-CM

## 2023-11-24 DIAGNOSIS — Z801 Family history of malignant neoplasm of trachea, bronchus and lung: Secondary | ICD-10-CM

## 2023-11-24 DIAGNOSIS — F17210 Nicotine dependence, cigarettes, uncomplicated: Secondary | ICD-10-CM | POA: Diagnosis present

## 2023-11-24 DIAGNOSIS — Z885 Allergy status to narcotic agent status: Secondary | ICD-10-CM

## 2023-11-24 DIAGNOSIS — I21A1 Myocardial infarction type 2: Secondary | ICD-10-CM | POA: Diagnosis present

## 2023-11-24 DIAGNOSIS — I493 Ventricular premature depolarization: Secondary | ICD-10-CM | POA: Diagnosis present

## 2023-11-24 DIAGNOSIS — Z604 Social exclusion and rejection: Secondary | ICD-10-CM | POA: Diagnosis present

## 2023-11-24 DIAGNOSIS — R54 Age-related physical debility: Secondary | ICD-10-CM | POA: Diagnosis present

## 2023-11-24 DIAGNOSIS — M1990 Unspecified osteoarthritis, unspecified site: Secondary | ICD-10-CM | POA: Diagnosis present

## 2023-11-24 DIAGNOSIS — Z7902 Long term (current) use of antithrombotics/antiplatelets: Secondary | ICD-10-CM

## 2023-11-24 DIAGNOSIS — Z8041 Family history of malignant neoplasm of ovary: Secondary | ICD-10-CM

## 2023-11-24 LAB — RESP PANEL BY RT-PCR (RSV, FLU A&B, COVID)  RVPGX2
Influenza A by PCR: NEGATIVE
Influenza B by PCR: NEGATIVE
Resp Syncytial Virus by PCR: NEGATIVE
SARS Coronavirus 2 by RT PCR: NEGATIVE

## 2023-11-24 LAB — CBC
HCT: 34.3 % — ABNORMAL LOW (ref 39.0–52.0)
Hemoglobin: 10.6 g/dL — ABNORMAL LOW (ref 13.0–17.0)
MCH: 28.4 pg (ref 26.0–34.0)
MCHC: 30.9 g/dL (ref 30.0–36.0)
MCV: 92 fL (ref 80.0–100.0)
Platelets: 233 10*3/uL (ref 150–400)
RBC: 3.73 MIL/uL — ABNORMAL LOW (ref 4.22–5.81)
RDW: 16.2 % — ABNORMAL HIGH (ref 11.5–15.5)
WBC: 16.5 10*3/uL — ABNORMAL HIGH (ref 4.0–10.5)
nRBC: 0 % (ref 0.0–0.2)

## 2023-11-24 LAB — COMPREHENSIVE METABOLIC PANEL
ALT: 20 U/L (ref 0–44)
AST: 42 U/L — ABNORMAL HIGH (ref 15–41)
Albumin: 3.2 g/dL — ABNORMAL LOW (ref 3.5–5.0)
Alkaline Phosphatase: 140 U/L — ABNORMAL HIGH (ref 38–126)
Anion gap: 12 (ref 5–15)
BUN: 29 mg/dL — ABNORMAL HIGH (ref 8–23)
CO2: 23 mmol/L (ref 22–32)
Calcium: 10.3 mg/dL (ref 8.9–10.3)
Chloride: 102 mmol/L (ref 98–111)
Creatinine, Ser: 1.64 mg/dL — ABNORMAL HIGH (ref 0.61–1.24)
GFR, Estimated: 43 mL/min — ABNORMAL LOW (ref >60)
Glucose, Bld: 138 mg/dL — ABNORMAL HIGH (ref 70–99)
Potassium: 4.5 mmol/L (ref 3.5–5.1)
Sodium: 137 mmol/L (ref 135–145)
Total Bilirubin: 0.9 mg/dL (ref 0.0–1.2)
Total Protein: 7.1 g/dL (ref 6.5–8.1)

## 2023-11-24 LAB — TROPONIN I (HIGH SENSITIVITY)
Troponin I (High Sensitivity): 3519 ng/L (ref <18)
Troponin I (High Sensitivity): 2945 ng/L (ref <18)
Troponin I (High Sensitivity): 2405 ng/L (ref <18)

## 2023-11-24 LAB — PROCALCITONIN: Procalcitonin: <0.1 ng/mL

## 2023-11-24 LAB — APTT
aPTT: 61 s — ABNORMAL HIGH (ref 24–36)
aPTT: 99 s — ABNORMAL HIGH (ref 24–36)

## 2023-11-24 LAB — BRAIN NATRIURETIC PEPTIDE: B Natriuretic Peptide: >4500 pg/mL — ABNORMAL HIGH (ref 0.0–100.0)

## 2023-11-24 LAB — HEPARIN LEVEL (UNFRACTIONATED): Heparin Unfractionated: 0.86 [IU]/mL — ABNORMAL HIGH (ref 0.30–0.70)

## 2023-11-24 LAB — MRSA NEXT GEN BY PCR, NASAL: MRSA by PCR Next Gen: NOT DETECTED

## 2023-11-24 MED ORDER — ALLOPURINOL 300 MG PO TABS
300.0000 mg | ORAL_TABLET | Freq: Every day | ORAL | Status: DC
  Administered 2023-11-24 – 2023-11-27 (×4): 300 mg via ORAL
  Filled 2023-11-24 (×4): qty 1

## 2023-11-24 MED ORDER — NITROGLYCERIN 2 % TD OINT
0.5000 [in_us] | TOPICAL_OINTMENT | Freq: Once | TRANSDERMAL | Status: AC
  Administered 2023-11-24: 0.5 [in_us] via TOPICAL
  Filled 2023-11-24: qty 1

## 2023-11-24 MED ORDER — ACETAMINOPHEN 325 MG PO TABS
650.0000 mg | ORAL_TABLET | Freq: Four times a day (QID) | ORAL | Status: DC | PRN
  Administered 2023-11-24: 650 mg via ORAL
  Filled 2023-11-24: qty 2

## 2023-11-24 MED ORDER — GABAPENTIN 300 MG PO CAPS
600.0000 mg | ORAL_CAPSULE | Freq: Two times a day (BID) | ORAL | Status: DC
  Administered 2023-11-24 – 2023-11-27 (×7): 600 mg via ORAL
  Filled 2023-11-24 (×8): qty 2

## 2023-11-24 MED ORDER — SACUBITRIL-VALSARTAN 24-26 MG PO TABS
1.0000 | ORAL_TABLET | Freq: Two times a day (BID) | ORAL | Status: DC
  Administered 2023-11-24: 1 via ORAL
  Filled 2023-11-24: qty 1

## 2023-11-24 MED ORDER — HYDROMORPHONE HCL 1 MG/ML IJ SOLN: 0.5000 mg | INTRAMUSCULAR | Status: AC | PRN

## 2023-11-24 MED ORDER — ATORVASTATIN CALCIUM 80 MG PO TABS
80.0000 mg | ORAL_TABLET | Freq: Every day | ORAL | Status: DC
  Administered 2023-11-24 – 2023-11-26 (×3): 80 mg via ORAL
  Filled 2023-11-24 (×3): qty 1

## 2023-11-24 MED ORDER — AMIODARONE HCL 200 MG PO TABS
200.0000 mg | ORAL_TABLET | Freq: Every day | ORAL | Status: DC
  Administered 2023-11-24 – 2023-11-27 (×4): 200 mg via ORAL
  Filled 2023-11-24 (×4): qty 1

## 2023-11-24 MED ORDER — POLYETHYLENE GLYCOL 3350 17 G PO PACK: 17.0000 g | PACK | Freq: Every day | ORAL | Status: DC | PRN

## 2023-11-24 MED ORDER — INFLUENZA VAC A&B SURF ANT ADJ 0.5 ML IM SUSY: 0.5000 mL | PREFILLED_SYRINGE | INTRAMUSCULAR | Status: DC | PRN

## 2023-11-24 MED ORDER — HEPARIN (PORCINE) 25000 UT/250ML-% IV SOLN
1100.0000 [IU]/h | INTRAVENOUS | Status: DC
  Administered 2023-11-24: 1000 [IU]/h via INTRAVENOUS
  Administered 2023-11-25: 1150 [IU]/h via INTRAVENOUS
  Filled 2023-11-24 (×2): qty 250

## 2023-11-24 MED ORDER — FUROSEMIDE 10 MG/ML IJ SOLN
80.0000 mg | Freq: Once | INTRAMUSCULAR | Status: AC
Start: 2023-11-24 — End: 2023-11-24
  Administered 2023-11-24: 80 mg via INTRAVENOUS
  Filled 2023-11-24: qty 8

## 2023-11-24 MED ORDER — ACETAMINOPHEN 650 MG RE SUPP
650.0000 mg | Freq: Four times a day (QID) | RECTAL | Status: DC | PRN
Start: 2023-11-24 — End: 2023-11-27

## 2023-11-24 MED ORDER — FUROSEMIDE 10 MG/ML IJ SOLN
40.0000 mg | Freq: Two times a day (BID) | INTRAMUSCULAR | Status: DC
  Administered 2023-11-24 – 2023-11-25 (×2): 40 mg via INTRAVENOUS
  Filled 2023-11-24 (×2): qty 4

## 2023-11-24 MED ORDER — CLOPIDOGREL BISULFATE 75 MG PO TABS
75.0000 mg | ORAL_TABLET | Freq: Every day | ORAL | Status: DC
  Administered 2023-11-24 – 2023-11-27 (×4): 75 mg via ORAL
  Filled 2023-11-24 (×4): qty 1

## 2023-11-24 MED ORDER — BISACODYL 5 MG PO TBEC: 5.0000 mg | DELAYED_RELEASE_TABLET | Freq: Every day | ORAL | Status: DC | PRN

## 2023-11-24 MED ORDER — ALBUTEROL SULFATE (2.5 MG/3ML) 0.083% IN NEBU: 2.5000 mg | INHALATION_SOLUTION | RESPIRATORY_TRACT | Status: AC | PRN

## 2023-11-24 MED ORDER — GUAIFENESIN-DM 100-10 MG/5ML PO SYRP
5.0000 mL | ORAL_SOLUTION | ORAL | Status: DC | PRN
  Administered 2023-11-24 – 2023-11-26 (×2): 5 mL via ORAL
  Filled 2023-11-24 (×3): qty 5

## 2023-11-24 MED ORDER — CARVEDILOL 12.5 MG PO TABS
12.5000 mg | ORAL_TABLET | Freq: Two times a day (BID) | ORAL | Status: DC
  Administered 2023-11-24 – 2023-11-26 (×4): 12.5 mg via ORAL
  Filled 2023-11-24 (×4): qty 1

## 2023-11-24 MED ORDER — FENTANYL CITRATE PF 50 MCG/ML IJ SOSY
12.5000 ug | PREFILLED_SYRINGE | INTRAMUSCULAR | Status: DC | PRN
  Administered 2023-11-24 – 2023-11-26 (×7): 12.5 ug via INTRAVENOUS
  Filled 2023-11-24 (×8): qty 1

## 2023-11-24 MED ORDER — ENSURE ENLIVE PO LIQD
237.0000 mL | Freq: Two times a day (BID) | ORAL | Status: DC
  Administered 2023-11-24 – 2023-11-26 (×5): 237 mL via ORAL

## 2023-11-24 MED ORDER — HYDRALAZINE HCL 20 MG/ML IJ SOLN: 10.0000 mg | Freq: Four times a day (QID) | INTRAMUSCULAR | Status: DC | PRN

## 2023-11-24 MED ORDER — EMPAGLIFLOZIN 10 MG PO TABS
10.0000 mg | ORAL_TABLET | Freq: Every day | ORAL | Status: DC
Start: 2023-11-24 — End: 2023-11-27
  Administered 2023-11-24 – 2023-11-27 (×4): 10 mg via ORAL
  Filled 2023-11-24 (×4): qty 1

## 2023-11-24 NOTE — Plan of Care (Signed)
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
## 2023-11-24 NOTE — Progress Notes (Addendum)
 Patient is complaining about chest pain with cough. Per chart review patient has been admitted for elevated troponin and chest pain with known history of CAD.  Currently on heparin drip, Plavix.  Cardiology has been following the patient. At the same time patient has history of COPD without exacerbation. Continue Mucinex for cough control. Nitroglycerin 1 inch / 50 mg already has been applied.   Still complaining about pleuritic chest pain.   Patient is allergic to morphine and Dilaudid.  Starting fentanyl 12.5 mcg every 2 hour as needed for moderate and severe pain as needed for pain control.  Tereasa Coop, MD [facility] 11/24/2023, 7:59 PM

## 2023-11-24 NOTE — TOC Initial Note (Signed)
 Transition of Care [facility]) - Initial/Assessment Note    Patient Details  Name: Richard Rivers MRN: [id number] Date of Birth: Mar 13, 1946  Transition of Care [facility]) CM/SW Contact:    Marliss Coots, LCSW Phone Number: 11/24/2023, 2:46 PM  Clinical Narrative:                  2:46 PM CSW introduced self and role to patient at bedside. Patient informed CSW that he currently resides with son where he expects to discharge to. Patient stated son could provide transportation for discharge. Patient stated he feels at safe at home. No TOC needs identified at this time.  Expected Discharge Plan: Home/Self Care Barriers to Discharge: Continued Medical Work up   Patient Goals and CMS Choice            Expected Discharge Plan and Services       Living arrangements for the past 2 months: Single Family Home                                      Prior Living Arrangements/Services Living arrangements for the past 2 months: Single Family Home Lives with:: Adult Children Patient language and need for interpreter reviewed:: Yes Do you feel safe going back to the place where you live?: Yes            Criminal Activity/Legal Involvement Pertinent to Current Situation/Hospitalization: No - Comment as needed  Activities of Daily Living   ADL Screening (condition at time of admission) Independently performs ADLs?: No Does the patient have a NEW difficulty with bathing/dressing/toileting/self-feeding that is expected to last >3 days?: Yes (Initiates electronic notice to provider for possible OT consult) Does the patient have a NEW difficulty with getting in/out of bed, walking, or climbing stairs that is expected to last >3 days?: Yes (Initiates electronic notice to provider for possible PT consult) Does the patient have a NEW difficulty with communication that is expected to last >3 days?: No Is the patient deaf or have difficulty hearing?: No Does the patient have difficulty  seeing, even when wearing glasses/contacts?: Yes Does the patient have difficulty concentrating, remembering, or making decisions?: Yes  Permission Sought/Granted Permission sought to share information with : Family Supports Permission granted to share information with : No (Contact information on chart)  Share Information with NAME: Richard Rivers     Permission granted to share info w Relationship: Son  Permission granted to share info w Contact Information: [phone]  Emotional Assessment Appearance:: Appears stated age Attitude/Demeanor/Rapport: Engaged Affect (typically observed): Accepting, Appropriate, Adaptable, Calm, Stable, Pleasant Orientation: : Oriented to Situation, Oriented to  Time, Oriented to Place, Oriented to Self Alcohol / Substance Use: Not Applicable Psych Involvement: No (comment)  Admission diagnosis:  Acute exacerbation of CHF (congestive heart failure) (HCC) [I50.9] Acute on chronic congestive heart failure, unspecified heart failure type (HCC) [I50.9] Patient Active Problem List   Diagnosis Date Noted   Acute exacerbation of CHF (congestive heart failure) (HCC) 11/24/2023   Carotid stenosis 10/23/2023   Protein-calorie malnutrition, severe 10/19/2023   Multifocal pneumonia 10/16/2023   Atrial flutter (HCC) 10/16/2023   COPD (chronic obstructive pulmonary disease) (HCC) 10/16/2023   Marijuana use 10/16/2023   Prolonged QT interval 10/16/2023   Hyperkalemia 10/16/2023   Malnutrition of moderate degree 09/14/2023   NSTEMI (non-ST elevated myocardial infarction) (HCC) 09/12/2023   HFrEF (heart failure with reduced ejection fraction) (HCC)  09/03/2023   Pulmonary hypertension (HCC) 09/03/2023   PAD (peripheral artery disease) (HCC) 09/02/2023   Neuropathy 09/02/2023   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, level unspecified, initial encounter (HCC) 09/02/2023   COPD with acute exacerbation (HCC) 09/02/2023   Depression with anxiety  09/01/2023   Pleural effusion 09/01/2023   Myocardial injury 09/01/2023   Hypertension    HLD (hyperlipidemia)    CAD (coronary artery disease)    Gout    Special screening for malignant neoplasms, colon    Benign neoplasm of appendix    Benign neoplasm of sigmoid colon    Benign neoplasm of ascending colon    Benign neoplasm of transverse colon    Fracture of neck of femur (HCC) 02/01/2015   PCP:  System, Provider Not In Pharmacy:   [id number] - [place], [place] - [address] AT [facility] [address] [facility] [address] [place] [address] Phone: [phone] Fax: [phone]  [facility] [place], [place] - [address] [place] [address] Phone: [phone] Fax: [phone]  [facility] [address] [place] [address] Phone: [phone] Fax: [phone]     Social Drivers of Health (SDOH) Social History: SDOH Screenings   Food Insecurity: No Food Insecurity (11/24/2023)  Housing: Low Risk  (11/24/2023)  Transportation Needs: No Transportation Needs (11/24/2023)  Utilities: Not At Risk (11/24/2023)  Alcohol Screen: Low Risk  (09/13/2023)  Financial Resource Strain: Low Risk  (09/13/2023)  Social Connections: Socially Isolated (11/24/2023)  Tobacco Use: High Risk (11/24/2023)   SDOH Interventions:     Readmission Risk Interventions    10/22/2023   11:51 AM  Readmission Risk Prevention Plan  Transportation Screening Complete  Medication Review (RN Care Manager) Complete  HRI or Home Care Consult Complete  SW Recovery Care/Counseling Consult Complete  Palliative Care Screening Not Applicable  Skilled Nursing Facility Not Applicable

## 2023-11-24 NOTE — Progress Notes (Signed)
 Patient c/o 8/10 chest pain with cough.  Pt has strong congested cough, thick white sputum noted.  Dr. Janalyn Shy paged to request something for pain.  Waiting return call.  Tylenol po given per patient request.  Robitussin ordered from General admission order set.

## 2023-11-24 NOTE — ED Provider Notes (Signed)
 [place] EMERGENCY DEPARTMENT AT [facility] Provider Note   CSN: [id number] Arrival date & time: 11/24/23  0125     History  Chief Complaint  Patient presents with   Shortness of Breath    Richard Rivers is a 78 y.o. male with PMHx CAD, COPD, CHF, HLD, HTN, PAD who presents to ED concerned for cough, SOB, and intermittent left sided chest pain x1-2 days. Symptoms have been worsening in severity. Patient also reports vomiting 3-4 times today. Patient on 3L [place] at baseline which is not resolving the SOB. Patient endorses taking NTG last night which somewhat resolved chest pain. Patient stating that he had another episode of chest pain x5 hours today that was also not resolved with NTG - but patient currently without pain. Patient denies recent weight gain - states that he recently has loss weight.  Denies fever, nausea, diarrhea, dysuria, hematuria, hematochezia.    Shortness of Breath      Home Medications Prior to Admission medications   Medication Sig Start Date End Date Taking? Authorizing Provider  acetaminophen (TYLENOL) 325 MG tablet Take 650 mg by mouth 3 (three) times daily as needed.    [provider]  allopurinol (ZYLOPRIM) 300 MG tablet Take 300 mg by mouth daily.    [provider]  amiodarone (PACERONE) 200 MG tablet Take 1 tablet (200 mg total) by mouth 2 (two) times daily for 1 day, THEN 1 tablet (200 mg total) daily. 10/22/23 11/22/23  Joseph Art, DO  ammonium lactate (LAC-HYDRIN) 12 % lotion Apply 1 Application topically 2 (two) times daily as needed. APPLY TO DRY SKIN ON FEET 04/14/23   [provider]  atorvastatin (LIPITOR) 80 MG tablet Take 80 mg by mouth at bedtime. 04/14/23   [provider]  busPIRone (BUSPAR) 10 MG tablet Take 10 mg by mouth 2 (two) times daily as needed.    [provider]  carvedilol (COREG) 12.5 MG tablet Take 12.5 mg by mouth 2 (two) times daily with a meal.    [provider]  citalopram (CELEXA) 10 MG tablet Take 10 mg by mouth daily.    [provider]  clopidogrel (PLAVIX) 75 MG tablet Take 1 tablet (75 mg total) by mouth daily. 09/16/23   Dorcas Carrow, MD  empagliflozin (JARDIANCE) 10 MG TABS tablet Take 1 tablet (10 mg total) by mouth daily. 10/23/23   Joseph Art, DO  famotidine (PEPCID) 20 MG tablet Take 20 mg by mouth daily as needed for heartburn or indigestion. 04/14/23   [provider]  gabapentin (NEURONTIN) 300 MG capsule Take 600 mg by mouth 2 (two) times daily.    [provider]  guaiFENesin (MUCINEX) 600 MG 12 hr tablet Take 600 mg by mouth every 12 (twelve) hours as needed for cough. Patient not taking: Reported on 10/16/2023 04/14/23   [provider]  Ipratropium-Albuterol (COMBIVENT RESPIMAT) 20-100 MCG/ACT AERS respimat Inhale 1 puff into the lungs 4 (four) times daily.    [provider]  isosorbide mononitrate (IMDUR) 30 MG 24 hr tablet Take 1 tablet (30 mg total) by mouth daily. 09/16/23   Dorcas Carrow, MD  nitroGLYCERIN (NITROSTAT) 0.4 MG SL tablet Place 1 tablet (0.4 mg total) under the tongue every 5 (five) minutes x 3 doses as needed for chest pain. 09/15/23   Dorcas Carrow, MD  rivaroxaban (XARELTO) 20 MG TABS tablet Take 20 mg by mouth daily with supper.    [provider]  sacubitril-valsartan (ENTRESTO) 24-26 MG Take 1 tablet by mouth 2 (two) times daily. 09/15/23   Dorcas Carrow, MD  torsemide (DEMADEX) 20 MG tablet Take 1 tablet (20 mg total) by mouth daily. 10/23/23   Joseph Art, DO  traZODone (DESYREL) 50 MG tablet Take 1-3 tablets by mouth at bedtime. Take 2 tablets at night    [provider]      Allergies    Codeine and Penicillin g    Review of Systems   Review of Systems  Respiratory:  Positive for shortness of breath.     Physical Exam Updated Vital Signs BP (!) 130/105   Pulse (!) 101   Temp 97.6 F (36.4 C)   Resp (!) 23   Ht  6' (1.829 m)   Wt 68 kg   SpO2 98%   BMI 20.33 kg/m  Physical Exam Vitals and nursing note reviewed.  Constitutional:      General: He is not in acute distress.    Appearance: He is not ill-appearing or toxic-appearing.  HENT:     Head: Normocephalic and atraumatic.     Mouth/Throat:     Mouth: Mucous membranes are moist.     Pharynx: No posterior oropharyngeal erythema.  Eyes:     General: No scleral icterus.       Right eye: No discharge.        Left eye: No discharge.     Conjunctiva/sclera: Conjunctivae normal.  Cardiovascular:     Rate and Rhythm: Normal rate and regular rhythm.     Pulses: Normal pulses.     Heart sounds: Normal heart sounds. No murmur heard. Pulmonary:     Effort: Pulmonary effort is normal. No respiratory distress.     Breath sounds: Normal breath sounds. No wheezing, rhonchi or rales.  Abdominal:     General: Bowel sounds are normal.     Palpations: Abdomen is soft.     Tenderness: There is no abdominal tenderness.  Musculoskeletal:     Comments: +1 pitting edema BL  Skin:    General: Skin is warm and dry.     Findings: No rash.  Neurological:     General: No focal deficit present.     Mental Status: He is alert and oriented to person, place, and time. Mental status is at baseline.  Psychiatric:        Mood and Affect: Mood normal.     ED Results / Procedures / Treatments   Labs (all labs ordered are listed, but only abnormal results are displayed) Labs Reviewed  CBC - Abnormal; Notable for the following components:      Result Value   WBC 16.5 (*)    RBC 3.73 (*)    Hemoglobin 10.6 (*)    HCT 34.3 (*)    RDW 16.2 (*)    All other components within normal limits  COMPREHENSIVE METABOLIC PANEL - Abnormal; Notable for the following components:   Glucose, Bld 138 (*)    BUN 29 (*)    Creatinine, Ser 1.64 (*)    Albumin 3.2 (*)    AST 42 (*)    Alkaline Phosphatase 140 (*)    GFR, Estimated 43 (*)    All other components within  normal limits  BRAIN NATRIURETIC PEPTIDE - Abnormal; Notable for the following components:   B Natriuretic Peptide >4,500.0 (*)    All other components within normal limits  TROPONIN I (HIGH SENSITIVITY) - Abnormal; Notable for the following components:  Troponin I (High Sensitivity) 3,519 (*)    All other components within normal limits  TROPONIN I (HIGH SENSITIVITY) - Abnormal; Notable for the following components:   Troponin I (High Sensitivity) 2,945 (*)    All other components within normal limits  RESP PANEL BY RT-PCR (RSV, FLU A&B, COVID)  [id number]    EKG EKG Interpretation Date/Time:  Wednesday November 24 2023 01:33:08 EDT Ventricular Rate:  102 PR Interval:  174 QRS Duration:  102 QT Interval:  326 QTC Calculation: 425 R Axis:   37  Text Interpretation: Sinus tachycardia Anterior infarct, old Repol abnrm suggests ischemia, lateral leads Confirmed by Ross Marcus ([id number]) on 11/24/2023 3:08:38 AM  Radiology DG Chest 2 View Result Date: 11/24/2023 CLINICAL DATA:  Shortness of breath for 1 day, worsening. Diagnosed with pneumonia 1 month ago. EXAM: CHEST - 2 VIEW COMPARISON:  10/16/2023 FINDINGS: Cardiac enlargement. Small bilateral pleural effusions. Perihilar and basilar infiltration on the left. Mild infiltration in the right base. Appearances are similar to previous study, possibly representing residual pneumonia or edema. Calcification of the aorta. No pneumothorax. IMPRESSION: 1. Cardiac enlargement. 2. Small bilateral pleural effusions. 3. Bilateral pulmonary infiltrates, greater on the left. Appearances are similar to prior study, possibly pneumonia or edema. Electronically Signed   By: Burman Nieves M.D.   On: 11/24/2023 02:08    Procedures .Critical Care  Performed by: Dorthy Cooler, PA-C Authorized by: Dorthy Cooler, PA-C   Critical care provider statement:    Critical care time (minutes):  30   Critical care was necessary to treat or prevent  imminent or life-threatening deterioration of the following conditions: CHF.   Critical care was time spent personally by me on the following activities:  Development of treatment plan with patient or surrogate, discussions with consultants, evaluation of patient's response to treatment, examination of patient, ordering and review of laboratory studies, ordering and review of radiographic studies, ordering and performing treatments and interventions, pulse oximetry, re-evaluation of patient's condition and review of old charts   Care discussed with: admitting provider   Comments:     CHF and demand ischemia requiring admission and cardiology consultation     Medications Ordered in ED Medications  heparin ADULT infusion 100 units/mL (25000 units/265mL) (1,000 Units/hr Intravenous New Bag/Given 11/24/23 0541)  furosemide (LASIX) injection 80 mg (80 mg Intravenous Given 11/24/23 0502)  nitroGLYCERIN (NITROGLYN) 2 % ointment 0.5 inch (0.5 inches Topical Given 11/24/23 0458)    ED Course/ Medical Decision Making/ A&P                                 Medical Decision Making Amount and/or Complexity of Data Reviewed Labs: ordered. Radiology: ordered.  Risk Prescription drug management.   This patient presents to the ED for concern of chest pain, this involves an extensive number of treatment options, and is a complaint that carries with it a high risk of complications and morbidity.  The differential diagnosis includes acute coronary syndrome, congestive heart failure, pericarditis, pneumonia, pulmonary embolism, tension pneumothorax, esophageal rupture, aortic dissection, cardiac tamponade, musculoskeletal   Co morbidities that complicate the patient evaluation  CAD, COPD, CHF, HLD, HTN, PAD   Additional history obtained:  Additional history obtained from 10/20/2023 heart cath: Ost Cx to Prox Cx lesion is 50% stenosed. Mid LM to Dist LM lesion is 50% stenosed. Prox RCA to Mid RCA lesion  is 100% stenosed. 1st Diag lesion is 70%  stenosed. Recommend medical therapy for coronary artery disease.  Suspect elevated troponin is due to supply demand ischemia. 10/2023 ECHO: 35-40% EF   Problem List / ED Course / Critical interventions / Medication management  Patient presented for SOB, cough, chest pain developing over the past 1-2 days. Patient took NTG last night which did not fully resolve chest pain. Symptoms initially concerning for COPD exacerbation so EMS provided patient with breathing treatments and IV steroids. My initial physical exam was unremarkable. Patient mildly tachycardic, but rest of vitals reassuring. I Ordered, and personally interpreted labs.  Initial troponin elevated at 3519.  Respiratory panel negative.  CMP with elevation in BUN/creatinine at 29/1.64.  CBC with leukocytosis at 16.5.  There is also anemia with hemoglobin at 10.6. I ordered imaging studies including chest xray to assess for process contributing to patient's symptoms. I independently visualized and interpreted imaging which showed small BL pleural effusions. I agree with the radiologist interpretation I requested consultation with the on-cal cardiologist Dr. French Ana,  and discussed lab and imaging findings as well as pertinent plan - they recommend:  hospitalist admission. heparin is okay for 24-48 hours. Continue to trend trops. Probably demand ischemia. They will place formal consult note. I have reviewed the patients home medicines and have made adjustments as needed Dr. Para March admitting provider.   Social Determinants of Health:  geriatric        Final Clinical Impression(s) / ED Diagnoses Final diagnoses:  Acute on chronic congestive heart failure, unspecified heart failure type [facility])    Rx / DC Orders ED Discharge Orders     None         Dorthy Cooler, [place] 11/24/23 0543    Shon Baton, MD 11/24/23 [phone]

## 2023-11-24 NOTE — Progress Notes (Signed)
 Heart Failure Navigator Progress Note  Assessed for Heart & Vascular TOC clinic readiness.  Patient does not meet criteria due to cardiology through [facility] . No HF TOC.   Navigator will sign off at this time.   Rhae Hammock, BSN, Scientist, clinical (histocompatibility and immunogenetics) Only
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
## 2023-11-24 NOTE — Progress Notes (Signed)
   11/24/23 1438  Spiritual Encounters  Type of Visit Initial  Care provided to: Patient  Reason for visit Advance directives   Chaplain responded to Spiritual Consult for Advance Care Directive.  Chaplain arrived in room and delivered ACD education. Instructed Pt how to reach out to Spiritual Care if they have any questions and to contact us when they are ready to move forward.  Chaplain services remain available by Spiritual Consult or for emergent cases, paging [phone]  Chaplain Raelene Bott, MDiv [email or web address] [phone]
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
## 2023-11-24 NOTE — Plan of Care (Signed)
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
## 2023-11-24 NOTE — ED Triage Notes (Signed)
 Pt BIB EMS from home with c/o shob x 1 day progressively getting worse. Dx with pneumonia x 1 month ago. 3lpm [place] baseline at home, 93% with fire.   1 albuterol neb 2 duonebs 125mg  solumedrol   18G LFA

## 2023-11-25 DIAGNOSIS — I5023 Acute on chronic systolic (congestive) heart failure: Secondary | ICD-10-CM | POA: Diagnosis not present

## 2023-11-25 LAB — CBC
HCT: 28.1 % — ABNORMAL LOW (ref 39.0–52.0)
Hemoglobin: 9 g/dL — ABNORMAL LOW (ref 13.0–17.0)
MCH: 28.9 pg (ref 26.0–34.0)
MCHC: 32 g/dL (ref 30.0–36.0)
MCV: 90.4 fL (ref 80.0–100.0)
Platelets: 193 10*3/uL (ref 150–400)
RBC: 3.11 MIL/uL — ABNORMAL LOW (ref 4.22–5.81)
RDW: 15.9 % — ABNORMAL HIGH (ref 11.5–15.5)
WBC: 16.1 10*3/uL — ABNORMAL HIGH (ref 4.0–10.5)
nRBC: 0 % (ref 0.0–0.2)

## 2023-11-25 LAB — BASIC METABOLIC PANEL WITH GFR
Anion gap: 10 (ref 5–15)
BUN: 61 mg/dL — ABNORMAL HIGH (ref 8–23)
CO2: 28 mmol/L (ref 22–32)
Calcium: 9.5 mg/dL (ref 8.9–10.3)
Chloride: 101 mmol/L (ref 98–111)
Creatinine, Ser: 1.85 mg/dL — ABNORMAL HIGH (ref 0.61–1.24)
GFR, Estimated: 37 mL/min — ABNORMAL LOW (ref >60)
Glucose, Bld: 141 mg/dL — ABNORMAL HIGH (ref 70–99)
Potassium: 4.4 mmol/L (ref 3.5–5.1)
Sodium: 139 mmol/L (ref 135–145)

## 2023-11-25 LAB — HEPARIN LEVEL (UNFRACTIONATED): Heparin Unfractionated: 0.72 [IU]/mL — ABNORMAL HIGH (ref 0.30–0.70)

## 2023-11-25 LAB — APTT: aPTT: 101 s — ABNORMAL HIGH (ref 24–36)

## 2023-11-25 MED ORDER — RIVAROXABAN 15 MG PO TABS
15.0000 mg | ORAL_TABLET | Freq: Every day | ORAL | Status: DC
  Administered 2023-11-25 – 2023-11-26 (×2): 15 mg via ORAL
  Filled 2023-11-25 (×3): qty 1

## 2023-11-25 MED ORDER — IPRATROPIUM-ALBUTEROL 0.5-2.5 (3) MG/3ML IN SOLN
3.0000 mL | Freq: Three times a day (TID) | RESPIRATORY_TRACT | Status: DC
  Administered 2023-11-26 – 2023-11-27 (×4): 3 mL via RESPIRATORY_TRACT
  Filled 2023-11-25 (×4): qty 3

## 2023-11-25 MED ORDER — RIVAROXABAN 20 MG PO TABS: 20.0000 mg | ORAL_TABLET | Freq: Every day | ORAL | Status: DC

## 2023-11-25 MED ORDER — HEPARIN (PORCINE) 25000 UT/250ML-% IV SOLN: 1100.0000 [IU]/h | INTRAVENOUS | Status: AC

## 2023-11-25 MED ORDER — IPRATROPIUM-ALBUTEROL 0.5-2.5 (3) MG/3ML IN SOLN
3.0000 mL | Freq: Four times a day (QID) | RESPIRATORY_TRACT | Status: DC
  Administered 2023-11-25: 3 mL via RESPIRATORY_TRACT
  Filled 2023-11-25: qty 3

## 2023-11-25 NOTE — Plan of Care (Signed)
  Problem: Education: Goal: Knowledge of General Education information will improve Description: Including pain rating scale, medication(s)/side effects and non-pharmacologic comfort measures Outcome: Progressing   Problem: Clinical Measurements: Goal: Ability to maintain clinical measurements within normal limits will improve Outcome: Progressing   Problem: Elimination: Goal: Will not experience complications related to urinary retention Outcome: Progressing   
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
## 2023-11-25 NOTE — Progress Notes (Signed)
   Patient Name: ETHIN DRUMMOND Date of Encounter: 11/25/2023 [facility] Cardiologist: Rollene Rotunda, MD   Interval Summary  .    Feeling better but still with some pleuritic pain and pain with coughing.  Otherwise no complaints.  Vital Signs .    Vitals:   11/25/23 0429 11/25/23 0458 11/25/23 0615 11/25/23 0726  BP: 136/70  136/70 (!) 117/50  Pulse: 84 75 75 72  Resp: 19   19  Temp: 97.6 F (36.4 C)   97.7 F (36.5 C)  TempSrc: Oral   Oral  SpO2: 93% 99%  95%  Weight:      Height:        Intake/Output Summary (Last 24 hours) at 11/25/2023 0755 Last data filed at 11/25/2023 0729 Gross per 24 hour  Intake 376.16 ml  Output 850 ml  Net -473.84 ml      11/24/2023    9:14 AM 11/24/2023    1:29 AM 10/16/2023    7:14 AM  Last 3 Weights  Weight (lbs) 153 lb 1.6 oz 149 lb 14.6 oz 150 lb  Weight (kg) 69.446 kg 68 kg 68.04 kg      Telemetry/ECG    Sinus rhythm- Personally Reviewed  Physical Exam .   GEN: No acute distress.  Chronically ill-appearing on O2 per nasal cannula Neck: No JVD Cardiac: RRR, no murmurs, rubs, or gallops.  Respiratory: Scattered rhonchi GI: Soft, nontender, non-distended  MS: No edema  Assessment & Plan .     HFrEF, acute on chronic: IV diuresis with lasix, entresto on hold in setting AKI. On coreg, jardiance. -700 documented.  Creatinine from 1.64 up to 1.85.  Probably need to hold diuresis today and see where he settles out.  Continue to hold Entresto. Type II MI (demand ischemia from CHF/respiratory failure): med Rx, no angina, continue clopidogrel. No further ischemic eval (recent cath reviewed). OK to stop heparin.  Placed back on DOAC. Persisent AF: amiodarone, in sinus.    For questions or updates, please contact  [facility] Please consult [email or web address] for contact info under        Signed, Tonny Bollman, MD

## 2023-11-25 NOTE — Progress Notes (Addendum)
 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 -History of intermittent chest pains in the last few days, progressive dyspnea on exertion prior to that.  In the ER WBC 16, troponin 3519, BNP> 4600, creatinine 1.6, chest x-ray with pleural effusion and edema. -Prior to this recently hospitalized 2/15-2/21 for A-fib RVR NSTEMI and CHF exacerbation, LHC noted in-stent stenosis of left main, medical management was recommended then, discharged on Plavix Xarelto beta-blocker Imdur and statin as well as started on amiodarone   Subjective: -Still with some shortness of breath and cough, breathing overall a little better from yesterday  Assessment and Plan:  Acute on chronic systolic CHF Last echo 2/25 EF of 35 to 40%, grade 1 DD, normal RV, mild elevated PASP. -Continue IV Lasix 1 more day -Continue Jardiance and Coreg, Entresto on hold -Resume Aldactone if kidney function improves  NSTEMI vs demand ischemia -Troponin peaked at 3519, now improving -Currently on IV heparin,  switch back to xarelto, continue Coreg, Plavix and statin -LHC 2/25 with moderate in-stent stenosis of left main stent, medical management recommended then  Persistent A-fib Continue Coreg.,  Currently in sinus rhythm On Xarelto at baseline, currently on heparin drip, change back   AKI Baseline creatinine 1.24 from 2/21.  Elevated to 1.8 today. -Cardiorenal, Entresto held   COPD/chronic respiratory failure -Definitely has component of significant COPD contributing to his baseline dyspnea, add DuoNebs, flutter valve -Monitor off antibiotics, afebrile, procalcitonin<0.1,  flu/COVID/RSV PCR negative   H/o PAD/bypasses H/o carotid artery stenosis s/p L CEA -Continue Plavix and statin   Impaired mobility H/o gout, arthritis  Continue Neurontin Lives at home with his stepson, continues to smoke, has impaired mobility and uses walker. PT eval   DVT prophylaxis: IV heparin Code Status: Full code Family Communication: None present Disposition Plan: May need rehab  Consultants:    Procedures:   Antimicrobials:    Objective: Vitals:   11/25/23 0429 11/25/23 0458 11/25/23 0615 11/25/23 0726  BP: 136/70  136/70 (!) 117/50  Pulse: 84 75 75 72  Resp: 19   19  Temp: 97.6 F (36.4 C)   97.7 F (36.5 C)  TempSrc: Oral   Oral  SpO2: 93% 99%  95%  Weight:      Height:        Intake/Output Summary (Last 24 hours) at 11/25/2023 0944 Last data filed at 11/25/2023 8657 Gross per 24 hour  Intake 341.37 ml  Output 700 ml  Net -358.63 ml   Filed Weights   11/24/23 0129 11/24/23 0914  Weight: 68 kg 69.4 kg    Examination:  General exam: Chronically ill male sitting up in bed, AAOx3 HEENT: Positive JVD CVS: S1-S2, regular rhythm Lungs: Few scattered rhonchi, conducted upper airway sounds Abdomen: Soft, nontender, bowel sounds present Extremities: Trace edema Skin: No rashes Psychiatry:  Mood & affect appropriate.     Data Reviewed:   CBC: Recent Labs  Lab 11/24/23 0140 11/25/23 0735  WBC 16.5* 16.1*  HGB 10.6* 9.0*  HCT 34.3* 28.1*  MCV 92.0 90.4  PLT 233 193   Basic Metabolic Panel: Recent Labs  Lab 11/24/23 0140 11/25/23 0735  NA 137 139  K  4.5 4.4  CL 102 101  CO2 23 28  GLUCOSE 138* 141*  BUN 29* 61*  CREATININE 1.64* 1.85*  CALCIUM 10.3 9.5   GFR: Estimated Creatinine Clearance: 32.3 mL/min (A) (by C-G formula based on SCr of 1.85 mg/dL (H)). Liver Function Tests: Recent Labs  Lab 11/24/23 0140  AST 42*  ALT 20  ALKPHOS 140*  BILITOT 0.9  PROT 7.1  ALBUMIN 3.2*   No results for input(s): "LIPASE",  "AMYLASE" in the last 168 hours. No results for input(s): "AMMONIA" in the last 168 hours. Coagulation Profile: No results for input(s): "INR", "PROTIME" in the last 168 hours. Cardiac Enzymes: No results for input(s): "CKTOTAL", "CKMB", "CKMBINDEX", "TROPONINI" in the last 168 hours. BNP (last 3 results) No results for input(s): "PROBNP" in the last 8760 hours. HbA1C: No results for input(s): "HGBA1C" in the last 72 hours. CBG: No results for input(s): "GLUCAP" in the last 168 hours. Lipid Profile: No results for input(s): "CHOL", "HDL", "LDLCALC", "TRIG", "CHOLHDL", "LDLDIRECT" in the last 72 hours. Thyroid Function Tests: No results for input(s): "TSH", "T4TOTAL", "FREET4", "T3FREE", "THYROIDAB" in the last 72 hours. Anemia Panel: No results for input(s): "VITAMINB12", "FOLATE", "FERRITIN", "TIBC", "IRON", "RETICCTPCT" in the last 72 hours. Urine analysis:    Component Value Date/Time   COLORURINE YELLOW 10/05/2009 1342   APPEARANCEUR CLOUDY (A) 10/05/2009 1342   LABSPEC 1.010 10/05/2009 1342   PHURINE 8.5 (H) 10/05/2009 1342   GLUCOSEU NEGATIVE 10/05/2009 1342   HGBUR NEGATIVE 10/05/2009 1342   BILIRUBINUR NEGATIVE 10/05/2009 1342   KETONESUR NEGATIVE 10/05/2009 1342   PROTEINUR NEGATIVE 10/05/2009 1342   UROBILINOGEN 1.0 10/05/2009 1342   NITRITE NEGATIVE 10/05/2009 1342   LEUKOCYTESUR  10/05/2009 1342    NEGATIVE MICROSCOPIC NOT DONE ON URINES WITH NEGATIVE PROTEIN, BLOOD, LEUKOCYTES, NITRITE, OR GLUCOSE <1000 mg/dL.   Sepsis Labs: @LABRCNTIP (procalcitonin:4,lacticidven:4)  ) Recent Results (from the past 240 hours)  Resp panel by RT-PCR (RSV, Flu A&B, Covid) Anterior Nasal Swab     Status: None   Collection Time: 11/24/23  1:40 AM   Specimen: Anterior Nasal Swab  Result Value Ref Range Status   SARS Coronavirus 2 by RT PCR NEGATIVE NEGATIVE Final   Influenza A by PCR NEGATIVE NEGATIVE Final   Influenza B by PCR NEGATIVE NEGATIVE Final    Comment: (NOTE) The  Xpert Xpress SARS-CoV-2/FLU/RSV plus assay is intended as an aid in the diagnosis of influenza from Nasopharyngeal swab specimens and should not be used as a sole basis for treatment. Nasal washings and aspirates are unacceptable for Xpert Xpress SARS-CoV-2/FLU/RSV testing.  Fact Sheet for Patients: BloggerCourse.com  Fact Sheet for Healthcare Providers: SeriousBroker.it  This test is not yet approved or cleared by the [place] FDA and has been authorized for detection and/or diagnosis of SARS-CoV-2 by FDA under an Emergency Use Authorization (EUA). This EUA will remain in effect (meaning this test can be used) for the duration of the COVID-19 declaration under Section 564(b)(1) of the Act, 21 U.S.C. section 360bbb-3(b)(1), unless the authorization is terminated or revoked.     Resp Syncytial Virus by PCR NEGATIVE NEGATIVE Final    Comment: (NOTE) Fact Sheet for Patients: BloggerCourse.com  Fact Sheet for Healthcare Providers: SeriousBroker.it  This test is not yet approved or cleared by the [place] FDA and has been authorized for detection and/or diagnosis of SARS-CoV-2 by FDA under an Emergency Use Authorization (EUA). This EUA will remain in effect (meaning this test can be used) for the duration of the  COVID-19 declaration under Section 564(b)(1) of the Act, 21 U.S.C. section 360bbb-3(b)(1), unless the authorization is terminated or revoked.  Performed at [facility], [address], [place], [place] [address]   MRSA Next Gen by PCR, Nasal     Status: None   Collection Time: 11/24/23  9:18 AM   Specimen: Nasal Mucosa; Nasal Swab  Result Value Ref Range Status   MRSA by PCR Next Gen NOT DETECTED NOT DETECTED Final    Comment: (NOTE) The GeneXpert MRSA Assay (FDA approved for NASAL specimens only), is one component of a comprehensive MRSA colonization  surveillance program. It is not intended to diagnose MRSA infection nor to guide or monitor treatment for MRSA infections. Test performance is not FDA approved in patients less than 14 years old. Performed at [facility], [address] [place], [place] [address]      Radiology Studies: DG Chest 2 View Result Date: 11/24/2023 CLINICAL DATA:  Shortness of breath for 1 day, worsening. Diagnosed with pneumonia 1 month ago. EXAM: CHEST - 2 VIEW COMPARISON:  10/16/2023 FINDINGS: Cardiac enlargement. Small bilateral pleural effusions. Perihilar and basilar infiltration on the left. Mild infiltration in the right base. Appearances are similar to previous study, possibly representing residual pneumonia or edema. Calcification of the aorta. No pneumothorax. IMPRESSION: 1. Cardiac enlargement. 2. Small bilateral pleural effusions. 3. Bilateral pulmonary infiltrates, greater on the left. Appearances are similar to prior study, possibly pneumonia or edema. Electronically Signed   By: Burman Nieves M.D.   On: 11/24/2023 02:08     Scheduled Meds:  allopurinol  300 mg Oral Daily   amiodarone  200 mg Oral Daily   atorvastatin  80 mg Oral QHS   carvedilol  12.5 mg Oral BID WC   clopidogrel  75 mg Oral Daily   empagliflozin  10 mg Oral Daily   feeding supplement  237 mL Oral BID BM   furosemide  40 mg Intravenous BID   gabapentin  600 mg Oral BID   Continuous Infusions:  heparin 1,100 Units/hr (11/25/23 0853)     LOS: 1 day    Time spent:    Zannie Cove, MD [facility]   11/25/2023, 9:44 AM

## 2023-11-25 NOTE — Plan of Care (Signed)
  Problem: Education: Goal: Knowledge of General Education information will improve Description: Including pain rating scale, medication(s)/side effects and non-pharmacologic comfort measures Outcome: Progressing   Problem: Health Behavior/Discharge Planning: Goal: Ability to manage health-related needs will improve Outcome: Progressing   Problem: Clinical Measurements: Goal: Ability to maintain clinical measurements within normal limits will improve Outcome: Progressing Goal: Will remain free from infection Outcome: Progressing Goal: Diagnostic test results will improve Outcome: Progressing Goal: Respiratory complications will improve Outcome: Progressing Goal: Cardiovascular complication will be avoided Outcome: Progressing   Problem: Activity: Goal: Risk for activity intolerance will decrease Outcome: Progressing   Problem: Nutrition: Goal: Adequate nutrition will be maintained Outcome: Progressing   Problem: Coping: Goal: Level of anxiety will decrease Outcome: Progressing   Problem: Elimination: Goal: Will not experience complications related to bowel motility Outcome: Progressing Goal: Will not experience complications related to urinary retention Outcome: Progressing   Problem: Pain Managment: Goal: General experience of comfort will improve and/or be controlled Outcome: Progressing   Problem: Safety: Goal: Ability to remain free from injury will improve Outcome: Progressing   Problem: Skin Integrity: Goal: Risk for impaired skin integrity will decrease Outcome: Progressing   Problem: Education: Goal: Knowledge of disease or condition will improve Outcome: Progressing Goal: Knowledge of the prescribed therapeutic regimen will improve Outcome: Progressing   Problem: Activity: Goal: Ability to tolerate increased activity will improve Outcome: Progressing Goal: Will verbalize the importance of balancing activity with adequate rest periods Outcome:  Progressing   Problem: Respiratory: Goal: Ability to maintain a clear airway will improve Outcome: Progressing Goal: Levels of oxygenation will improve Outcome: Progressing Goal: Ability to maintain adequate ventilation will improve Outcome: Progressing   Problem: Education: Goal: Ability to demonstrate management of disease process will improve Outcome: Progressing Goal: Ability to verbalize understanding of medication therapies will improve Outcome: Progressing   Problem: Activity: Goal: Capacity to carry out activities will improve Outcome: Progressing   Problem: Cardiac: Goal: Ability to achieve and maintain adequate cardiopulmonary perfusion will improve Outcome: Progressing

## 2023-11-25 NOTE — Evaluation (Signed)
 Physical Therapy Evaluation and Discharge Patient Details Name: Richard Rivers MRN: [id number] DOB: Mar 10, 1946 Today's Date: 11/25/2023  History of Present Illness  Pt is a 78 y.o. male who presented 11/24/23 with complaint of progressively worsening shortness of breath. BNP and troponin elevated.  PMH significant for HTN, HLD, CAD with stent placement, NSETMI, gout, depression with anxiety, tobacco use, PAD, HFrEF, COPD on 3L, afib, L CEA,  Clinical Impression   Patient evaluated by Physical Therapy with no further acute PT needs identified. Ambulates modified independent with RW without imbalance. Patient is at baseline. PT is signing off. Thank you for this referral.         If plan is discharge home, recommend the following: A little help with bathing/dressing/bathroom;Assistance with cooking/housework;Assist for transportation;Help with stairs or ramp for entrance   Can travel by private vehicle        Equipment Recommendations None recommended by PT  Recommendations for Other Services       Functional Status Assessment Patient has not had a recent decline in their functional status     Precautions / Restrictions Precautions Precautions: Fall Recall of Precautions/Restrictions: Intact      Mobility  Bed Mobility Overal bed mobility: Independent                  Transfers Overall transfer level: Modified independent Equipment used: Rolling walker (2 wheels)                    Ambulation/Gait Ambulation/Gait assistance: Modified independent (Device/Increase time) Gait Distance (Feet): 150 Feet Assistive device: Rolling walker (2 wheels) Gait Pattern/deviations: Step-through pattern, Decreased stride length, Trunk flexed   Gait velocity interpretation: 1.31 - 2.62 ft/sec, indicative of limited Environmental consultant     Tilt Bed    Modified Rankin (Stroke Patients Only)       Balance  Overall balance assessment: History of Falls, Mild deficits observed, not formally tested, Modified Independent                                           Pertinent Vitals/Pain Pain Assessment Pain Assessment: No/denies pain    Home Living Family/patient expects to be discharged to:: Private residence Living Arrangements: Children Available Help at Discharge: Family;Friend(s);Available 24 hours/day;Personal care attendant Type of Home: House Home Access: Ramped entrance       Home Layout: One level Home Equipment: Wheelchair - power;Rollator (4 wheels);Grab bars - toilet;Grab bars - tub/shower;Shower seat - built in;Cane - single point;Shower seat      Prior Function Prior Level of Function : Needs assist;History of Falls (last six months)             Mobility Comments: Mod I for mobility, uses rollator in the home and to get into the [facility]. Uses power chair in the community       Extremity/Trunk Assessment   Upper Extremity Assessment Upper Extremity Assessment: Overall WFL for tasks assessed    Lower Extremity Assessment Lower Extremity Assessment: Generalized weakness (h/o orthopedic injuries bil LEs)    Cervical / Trunk Assessment Cervical / Trunk Assessment: Kyphotic  Communication   Communication Communication: No apparent difficulties    Cognition Arousal: Alert Behavior During Therapy: WFL for tasks assessed/performed   PT - Cognitive impairments: No  apparent impairments                         Following commands: Intact       Cueing Cueing Techniques: Verbal cues     General Comments General comments (skin integrity, edema, etc.): On 3L O2 (as at home) with sats >93%    Exercises     Assessment/Plan    PT Assessment Patient does not need any further PT services  PT Problem List         PT Treatment Interventions      PT Goals (Current goals can be found in the Care Plan section)  Acute Rehab PT Goals PT  Goal Formulation: All assessment and education complete, DC therapy    Frequency       Co-evaluation               AM-PAC PT "6 Clicks" Mobility  Outcome Measure Help needed turning from your back to your side while in a flat bed without using bedrails?: None Help needed moving from lying on your back to sitting on the side of a flat bed without using bedrails?: None Help needed moving to and from a bed to a chair (including a wheelchair)?: None Help needed standing up from a chair using your arms (e.g., wheelchair or bedside chair)?: None Help needed to walk in hospital room?: None Help needed climbing 3-5 steps with a railing? : A Little 6 Click Score: 23    End of Session Equipment Utilized During Treatment: Oxygen Activity Tolerance: Patient tolerated treatment well Patient left: in bed;with call bell/phone within reach Nurse Communication: Mobility status PT Visit Diagnosis: Other abnormalities of gait and mobility (R26.89)    Time: 6045-4098 PT Time Calculation (min) (ACUTE ONLY): 13 min   Charges:   PT Evaluation $PT Eval Low Complexity: 1 Low   PT General Charges $$ ACUTE PT VISIT: 1 Visit          Jerolyn Center, PT Acute Rehabilitation Services  Office [phone]   Zena Amos 11/25/2023, 3:55 PM

## 2023-11-26 DIAGNOSIS — I5023 Acute on chronic systolic (congestive) heart failure: Secondary | ICD-10-CM | POA: Diagnosis not present

## 2023-11-26 LAB — CBC
HCT: 28.7 % — ABNORMAL LOW (ref 39.0–52.0)
Hemoglobin: 8.8 g/dL — ABNORMAL LOW (ref 13.0–17.0)
MCH: 28 pg (ref 26.0–34.0)
MCHC: 30.7 g/dL (ref 30.0–36.0)
MCV: 91.4 fL (ref 80.0–100.0)
Platelets: 183 10*3/uL (ref 150–400)
RBC: 3.14 MIL/uL — ABNORMAL LOW (ref 4.22–5.81)
RDW: 16 % — ABNORMAL HIGH (ref 11.5–15.5)
WBC: 13.6 10*3/uL — ABNORMAL HIGH (ref 4.0–10.5)
nRBC: 0 % (ref 0.0–0.2)

## 2023-11-26 LAB — BASIC METABOLIC PANEL WITH GFR
Anion gap: 8 (ref 5–15)
BUN: 63 mg/dL — ABNORMAL HIGH (ref 8–23)
CO2: 29 mmol/L (ref 22–32)
Calcium: 9.3 mg/dL (ref 8.9–10.3)
Chloride: 101 mmol/L (ref 98–111)
Creatinine, Ser: 1.55 mg/dL — ABNORMAL HIGH (ref 0.61–1.24)
GFR, Estimated: 46 mL/min — ABNORMAL LOW (ref >60)
Glucose, Bld: 96 mg/dL (ref 70–99)
Potassium: 4.2 mmol/L (ref 3.5–5.1)
Sodium: 138 mmol/L (ref 135–145)

## 2023-11-26 MED ORDER — CARVEDILOL 6.25 MG PO TABS
6.2500 mg | ORAL_TABLET | Freq: Two times a day (BID) | ORAL | Status: DC
  Administered 2023-11-26 – 2023-11-27 (×2): 6.25 mg via ORAL
  Filled 2023-11-26 (×2): qty 1

## 2023-11-26 MED ORDER — TORSEMIDE 20 MG PO TABS
40.0000 mg | ORAL_TABLET | Freq: Every day | ORAL | Status: DC
Start: 2023-11-27 — End: 2023-11-27
  Administered 2023-11-27: 40 mg via ORAL
  Filled 2023-11-26: qty 2

## 2023-11-26 MED ORDER — FUROSEMIDE 40 MG PO TABS: 40.0000 mg | ORAL_TABLET | Freq: Every day | ORAL | Status: DC

## 2023-11-26 MED ORDER — PREDNISONE 20 MG PO TABS
40.0000 mg | ORAL_TABLET | Freq: Every day | ORAL | Status: DC
  Administered 2023-11-26 – 2023-11-27 (×2): 40 mg via ORAL
  Filled 2023-11-26 (×2): qty 2

## 2023-11-26 MED ORDER — FUROSEMIDE 10 MG/ML IJ SOLN
80.0000 mg | Freq: Once | INTRAMUSCULAR | Status: AC
  Administered 2023-11-26: 80 mg via INTRAVENOUS
  Filled 2023-11-26: qty 8

## 2023-11-26 NOTE — Progress Notes (Addendum)
 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 -History of intermittent chest pains in the last few days, progressive dyspnea on exertion prior to that.  In the ER WBC 16, troponin 3519, BNP> 4600, creatinine 1.6, chest x-ray with pleural effusion and edema. -Prior to this recently hospitalized 2/15-2/21 for A-fib RVR NSTEMI and CHF exacerbation, LHC noted in-stent stenosis of left main, medical management was recommended then, discharged on Plavix Xarelto beta-blocker Imdur and statin as well as started on amiodarone   Subjective: -Feels better, breathing is improving  Assessment and Plan:  Acute on chronic systolic CHF Last echo 2/25 EF of 35 to 40%, grade 1 DD, normal RV, mild elevated PASP. -Diuretics held yesterday with uptrending creatinine,  resume oral lasix today -Continue Jardiance and Coreg, Entresto on hold -Restart Aldactone tomorrow if kidney function continues to improve  NSTEMI vs demand ischemia -Troponin peaked at 3519, now improving -sp IV heparin, back on xarelto, continue Coreg, Plavix and statin -LHC 2/25 with moderate in-stent stenosis of left main stent, medical management recommended then  Persistent A-fib Continue Coreg.,  Currently in sinus rhythm On Xarelto at baseline, currently on heparin drip, change back   AKI Baseline creatinine 1.24 from 2/21.  Elevated to 1.8 today. -Cardiorenal, Entresto held   COPD/chronic respiratory failure Mild COPD exacerbation -Definitely has component of significant COPD contributing to his baseline dyspnea, continue DuoNebs, flutter valve, add prednisone -afebrile,  procalcitonin<0.1, flu/COVID/RSV PCR negative -Monitor off ABX   H/o PAD/bypasses H/o carotid artery stenosis s/p L CEA -Continue Plavix and statin   Impaired mobility H/o gout, arthritis  Continue Neurontin Lives at home with his stepson, continues to smoke, has impaired mobility and uses walker. PT eval   DVT prophylaxis: Xarelto Code Status: Full code Family Communication: None present Disposition Plan: Home with home health services in 1 to 2 days  Consultants:    Procedures:   Antimicrobials:    Objective: Vitals:   11/26/23 0002 11/26/23 0411 11/26/23 0712 11/26/23 0900  BP: (!) 114/57 (!) 116/59 (!) 114/56   Pulse: 70 69 76   Resp: 18 19 15    Temp: 98.1 F (36.7 C) 98.2 F (36.8 C) 98.2 F (36.8 C)   TempSrc: Oral Oral Oral   SpO2: 100% 96% 98%   Weight:    67.4 kg  Height:        Intake/Output Summary (Last 24 hours) at 11/26/2023 0958 Last data filed at 11/26/2023 0700 Gross per 24 hour  Intake 665.87 ml  Output 1675 ml  Net -1009.13 ml   Filed Weights   11/24/23 0129 11/24/23 0914 11/26/23 0900  Weight: 68 kg 69.4 kg 67.4 kg    Examination:  General exam: Chronically ill male sitting up in bed, AAOx3 HEENT: no JVD CVS: S1-S2, regular rhythm Lungs: Rare expiratory wheezes, scattered rhonchi, poor air movement Abdomen: Soft, nontender, bowel sounds present Extremities: Trace edema Skin: No rashes Psychiatry:  Mood & affect appropriate.     Data Reviewed:   CBC: Recent Labs  Lab 11/24/23 0140 11/25/23 0735 11/26/23 0220  WBC 16.5* 16.1* 13.6*  HGB 10.6* 9.0* 8.8*  HCT 34.3* 28.1* 28.7*  MCV 92.0 90.4 91.4  PLT 233 193 183   Basic Metabolic Panel: Recent Labs  Lab 11/24/23 0140 11/25/23 0735 11/26/23 0220  NA 137 139 138  K 4.5 4.4 4.2  CL 102 101 101  CO2 23 28 29   GLUCOSE 138* 141* 96  BUN 29* 61* 63*  CREATININE 1.64* 1.85* 1.55*  CALCIUM 10.3 9.5 9.3   GFR: Estimated Creatinine Clearance: 37.4 mL/min (A) (by C-G  formula based on SCr of 1.55 mg/dL (H)). Liver Function Tests: Recent Labs  Lab 11/24/23 0140  AST 42*  ALT 20  ALKPHOS 140*  BILITOT 0.9  PROT 7.1  ALBUMIN 3.2*   No results for input(s): "LIPASE", "AMYLASE" in the last 168 hours. No results for input(s): "AMMONIA" in the last 168 hours. Coagulation Profile: No results for input(s): "INR", "PROTIME" in the last 168 hours. Cardiac Enzymes: No results for input(s): "CKTOTAL", "CKMB", "CKMBINDEX", "TROPONINI" in the last 168 hours. BNP (last 3 results) No results for input(s): "PROBNP" in the last 8760 hours. HbA1C: No results for input(s): "HGBA1C" in the last 72 hours. CBG: No results for input(s): "GLUCAP" in the last 168 hours. Lipid Profile: No results for input(s): "CHOL", "HDL", "LDLCALC", "TRIG", "CHOLHDL", "LDLDIRECT" in the last 72 hours. Thyroid Function Tests: No results for input(s): "TSH", "T4TOTAL", "FREET4", "T3FREE", "THYROIDAB" in the last 72 hours. Anemia Panel: No results for input(s): "VITAMINB12", "FOLATE", "FERRITIN", "TIBC", "IRON", "RETICCTPCT" in the last 72 hours. Urine analysis:    Component Value Date/Time   COLORURINE YELLOW 10/05/2009 1342   APPEARANCEUR CLOUDY (A) 10/05/2009 1342   LABSPEC 1.010 10/05/2009 1342   PHURINE 8.5 (H) 10/05/2009 1342   GLUCOSEU NEGATIVE 10/05/2009 1342   HGBUR NEGATIVE 10/05/2009 1342   BILIRUBINUR NEGATIVE 10/05/2009 1342   KETONESUR NEGATIVE 10/05/2009 1342   PROTEINUR NEGATIVE 10/05/2009 1342   UROBILINOGEN 1.0 10/05/2009 1342   NITRITE NEGATIVE 10/05/2009 1342   LEUKOCYTESUR  10/05/2009 1342    NEGATIVE MICROSCOPIC NOT DONE ON URINES WITH NEGATIVE PROTEIN, BLOOD, LEUKOCYTES, NITRITE, OR GLUCOSE <1000 mg/dL.   Sepsis Labs: @LABRCNTIP (procalcitonin:4,lacticidven:4)  ) Recent Results (from the past 240 hours)  Resp panel by RT-PCR (RSV, Flu A&B, Covid) Anterior Nasal Swab     Status: None   Collection Time: 11/24/23  1:40 AM   Specimen: Anterior Nasal  Swab  Result Value Ref Range Status   SARS Coronavirus 2 by RT PCR NEGATIVE NEGATIVE Final   Influenza A by PCR NEGATIVE NEGATIVE Final   Influenza B by PCR NEGATIVE NEGATIVE Final    Comment: (NOTE) The Xpert Xpress SARS-CoV-2/FLU/RSV plus assay is intended as an aid in the diagnosis of influenza from Nasopharyngeal swab specimens and should not be used as a sole basis for treatment. Nasal washings and aspirates are unacceptable for Xpert Xpress SARS-CoV-2/FLU/RSV testing.  Fact Sheet for Patients: BloggerCourse.com  Fact Sheet for Healthcare Providers: SeriousBroker.it  This test is not yet approved or cleared by the [place] FDA and has been authorized for detection and/or diagnosis of SARS-CoV-2 by FDA under an Emergency Use Authorization (EUA). This EUA will remain in effect (meaning this test can be used) for the duration of the COVID-19 declaration under Section 564(b)(1) of the Act, 21 U.S.C. section 360bbb-3(b)(1), unless the authorization is terminated or revoked.     Resp Syncytial Virus by PCR NEGATIVE NEGATIVE Final    Comment: (NOTE) Fact Sheet for Patients: BloggerCourse.com  Fact Sheet for Healthcare Providers: SeriousBroker.it  This test is not yet approved or cleared by the [place] FDA and has  been authorized for detection and/or diagnosis of SARS-CoV-2 by FDA under an Emergency Use Authorization (EUA). This EUA will remain in effect (meaning this test can be used) for the duration of the COVID-19 declaration under Section 564(b)(1) of the Act, 21 U.S.C. section 360bbb-3(b)(1), unless the authorization is terminated or revoked.  Performed at [facility], [address], [place], [place] [address]   MRSA Next Gen by PCR, Nasal     Status: None   Collection Time: 11/24/23  9:18 AM   Specimen: Nasal Mucosa; Nasal Swab  Result Value Ref  Range Status   MRSA by PCR Next Gen NOT DETECTED NOT DETECTED Final    Comment: (NOTE) The GeneXpert MRSA Assay (FDA approved for NASAL specimens only), is one component of a comprehensive MRSA colonization surveillance program. It is not intended to diagnose MRSA infection nor to guide or monitor treatment for MRSA infections. Test performance is not FDA approved in patients less than 62 years old. Performed at [facility], [address], [place], [place] [address]      Radiology Studies: No results found.    Scheduled Meds:  allopurinol  300 mg Oral Daily   amiodarone  200 mg Oral Daily   atorvastatin  80 mg Oral QHS   carvedilol  12.5 mg Oral BID WC   clopidogrel  75 mg Oral Daily   empagliflozin  10 mg Oral Daily   feeding supplement  237 mL Oral BID BM   gabapentin  600 mg Oral BID   ipratropium-albuterol  3 mL Nebulization TID   predniSONE  40 mg Oral Q breakfast   rivaroxaban  15 mg Oral Q supper   Continuous Infusions:     LOS: 2 days    Time spent:    Zannie Cove, MD [facility]   11/26/2023, 9:58 AM

## 2023-11-26 NOTE — Progress Notes (Addendum)
 Progress Note  Patient Name: Richard Rivers Date of Encounter: 11/26/2023  Primary Cardiologist: Rollene Rotunda, MD  Subjective   Feeling better, less SOB. Getting neb treatment. No CP.  Inpatient Medications    Scheduled Meds:  allopurinol  300 mg Oral Daily   amiodarone  200 mg Oral Daily   atorvastatin  80 mg Oral QHS   carvedilol  12.5 mg Oral BID WC   clopidogrel  75 mg Oral Daily   empagliflozin  10 mg Oral Daily   feeding supplement  237 mL Oral BID BM   gabapentin  600 mg Oral BID   ipratropium-albuterol  3 mL Nebulization TID   rivaroxaban  15 mg Oral Q supper   Continuous Infusions:  PRN Meds: acetaminophen **OR** acetaminophen, bisacodyl, fentaNYL (SUBLIMAZE) injection, guaiFENesin-dextromethorphan, influenza vaccine adjuvanted, polyethylene glycol   Vital Signs    Vitals:   11/25/23 1945 11/26/23 0002 11/26/23 0411 11/26/23 0712  BP: (!) 113/59 (!) 114/57 (!) 116/59 (!) 114/56  Pulse: 70 70 69 76  Resp: 16 18 19 15   Temp: 98 F (36.7 C) 98.1 F (36.7 C) 98.2 F (36.8 C) 98.2 F (36.8 C)  TempSrc: Oral Oral Oral Oral  SpO2: 100% 100% 96% 98%  Weight:      Height:        Intake/Output Summary (Last 24 hours) at 11/26/2023 0810 Last data filed at 11/26/2023 0700 Gross per 24 hour  Intake 905.87 ml  Output 1675 ml  Net -769.13 ml      11/24/2023    9:14 AM 11/24/2023    1:29 AM 10/16/2023    7:14 AM  Last 3 Weights  Weight (lbs) 153 lb 1.6 oz 149 lb 14.6 oz 150 lb  Weight (kg) 69.446 kg 68 kg 68.04 kg     Telemetry    NSR, rare PVC/couplet - Personally Reviewed  Physical Exam   GEN: No acute distress. Frail appearing. HEENT: Normocephalic, atraumatic, sclera non-icteric. Neck: No JVD or bruits. Cardiac: RRR no murmurs, rubs, or gallops.  Respiratory: Diffusely diminished bilaterally without wheezing, rales or rhonchi. Breathing is unlabored. GI: Soft, nontender, non-distended, BS +x 4. MS: no deformity. Extremities: No clubbing or  cyanosis. Chronic skin thickening without clear edema. Neuro:  AAOx3. Follows commands. Psych:  Responds to questions appropriately with a normal affect.  Labs    High Sensitivity Troponin:   Recent Labs  Lab 11/24/23 0140 11/24/23 0437 11/24/23 1205  TROPONINIHS 3,519* 2,945* 2,405*      Cardiac EnzymesNo results for input(s): "TROPONINI" in the last 168 hours. No results for input(s): "TROPIPOC" in the last 168 hours.   Chemistry Recent Labs  Lab 11/24/23 0140 11/25/23 0735 11/26/23 0220  NA 137 139 138  K 4.5 4.4 4.2  CL 102 101 101  CO2 23 28 29   GLUCOSE 138* 141* 96  BUN 29* 61* 63*  CREATININE 1.64* 1.85* 1.55*  CALCIUM 10.3 9.5 9.3  PROT 7.1  --   --   ALBUMIN 3.2*  --   --   AST 42*  --   --   ALT 20  --   --   ALKPHOS 140*  --   --   BILITOT 0.9  --   --   GFRNONAA 43* 37* 46*  ANIONGAP 12 10 8      Hematology Recent Labs  Lab 11/24/23 0140 11/25/23 0735 11/26/23 0220  WBC 16.5* 16.1* 13.6*  RBC 3.73* 3.11* 3.14*  HGB 10.6* 9.0* 8.8*  HCT 34.3* 28.1*  28.7*  MCV 92.0 90.4 91.4  MCH 28.4 28.9 28.0  MCHC 30.9 32.0 30.7  RDW 16.2* 15.9* 16.0*  PLT 233 193 183    BNP Recent Labs  Lab 11/24/23 0140  BNP >4,500.0*     DDimer No results for input(s): "DDIMER" in the last 168 hours.   Radiology    No results found.  Cardiac Studies   2d echo 10/17/23   1. Left ventricular ejection fraction, by estimation, is 35 to 40%. The  left ventricle has moderately decreased function. The left ventricle  demonstrates global hypokinesis. Left ventricular diastolic parameters are  consistent with Grade I diastolic  dysfunction (impaired relaxation).   2. Right ventricular systolic function is normal. The right ventricular  size is normal. There is mildly elevated pulmonary artery systolic  pressure. The estimated right ventricular systolic pressure is 36.1 mmHg.   3. The mitral valve is grossly normal. Trivial mitral valve  regurgitation. No  evidence of mitral stenosis.   4. The aortic valve is tricuspid. Aortic valve regurgitation is not  visualized. No aortic stenosis is present.   5. The inferior vena cava is dilated in size with >50% respiratory  variability, suggesting right atrial pressure of 8 mmHg.   Comparison(s): No significant change from prior study.   Patient Profile     78 y.o. male with HTN, HLD, chronic HFrEF, persistent A. 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 He has had several admissions here recently (09/2023, 10/2023) and planned to continue f/u at the [place]. He was readmitted with SOB And mild chest pain, felt to have acute on chronic HFrEF.  Assessment & Plan    1. Acute on chronic HFrEF complicated by AKI on CKD stage 2, HTN, moderate pHTN by last cath - received IV Lasix through 3/27, discontinued due to Cr 1.85 (recent baseline Cr variable from 1.1-1.45)  - Entresto also on hold due to this - remains on Jardiance, carvedilol  - admit BNP concerning at >4500 - will discuss strategy with MD, question whether AHF needs to see regarding any further offerings  2. CAD with complex history above, recurrent NSTEMI - recurrently elevated troponin level to 3519, managed medically per notes with continuation of Plavix, on ASA given concomitant Xarelto and bleeding risk/anemia - treated with IV heparin initially, transitioned back to Xarelto 11/25/23 -  maintained on carvedilol, atorvastatin  3. Persistent atrial fibrillation - maintaining NSR on amiodarone 200mg  daily, carvedilol 12.5mg  BID - check AM TSH, trend LFTs (slightly abnormal on last check) given amio use - on Xarelto 15mg  qsupper adjusted for CrCl  4. Chronic appearing anemia, also with leukocytosis - variable Hgb recently, elevated WBC this admission - CXR bilateral pulmonary infiltrates L>R similar to prior study, possibly PNA or edema, small pleural effusions - Covid/flu/RSV neg - further per primary  Remainder per primary team  For questions or updates, please contact [facility] Please consult [email or web address] for contact info under Cardiology/STEMI.  Signed, Laurann Montana, PA-C 11/26/2023, 8:10 AM  Patient seen, examined. Available data reviewed. Agree with findings, assessment, and plan as outlined by Lucile Crater, PA-C.  The patient is independently interviewed and examined.  He is alert, oriented, elderly chronically ill-appearing male in no distress on O2 per nasal cannula.  HEENT is normal, JVP is normal, lungs with diffuse rhonchi, heart with distant heart sounds, regular rate and rhythm with no murmur gallop, abdomen soft and nontender with no masses, extremities with trace pretibial edema.  Labs reviewed and show that the patient's creatinine is trending downward from 1.85 down to 1.55 today.  Sherryll Burger has been on hold now for about 48 hours and I suspect this is because some improvement in renal function.  Chest x-ray and markedly elevated BNP were both consistent with acute on chronic HFrEF on admission.  He is clinically improving.  With improvement in his renal function, will increase his IV diuretics today with hope of improving volume status and converting him to oral diuretic therapy tomorrow.  He may be a candidate for hospital discharge over the weekend.  I think it is best to keep him off of ACE/ARB/ARNI at least temporarily while he is at high risk of  heart failure related AKI.  Will reduce his carvedilol from 12.5 mg twice daily to 6.25 mg twice daily in order to better preserve his renal perfusion pressure.  Will give furosemide 80 mg IV x 1 then back to oral diuretic therapy tomorrow. Will increase torsemide from his home dose of 20 mg daily to 40 mg daily on DC.   Tonny Bollman, M.D. 11/26/2023 10:27 AM

## 2023-11-26 NOTE — Progress Notes (Signed)
 Mobility Specialist Progress Note;   11/26/23 0940  Mobility  Activity Ambulated with assistance in hallway  Level of Assistance Contact guard assist, steadying assist  Assistive Device Front wheel walker  Distance Ambulated (ft) 200 ft  Activity Response Tolerated well  Mobility Referral Yes  Mobility visit 1 Mobility  Mobility Specialist Start Time (ACUTE ONLY) 0940  Mobility Specialist Stop Time (ACUTE ONLY) 0953  Mobility Specialist Time Calculation (min) (ACUTE ONLY) 13 min   Pt eager for mobility. On 2LO2 upon arrival. Required no physical assistance during ambulation, SV. Ambulated on 2LO2, VSS throughout. Able to decrease O2 to 1LO2 last 19ft of ambulation, SPO2 90%>. Pt returned back to bed with all needs met.   Caesar Bookman Mobility Specialist Please contact via SecureChat or [employer] [phone]

## 2023-11-26 NOTE — Plan of Care (Signed)
  Problem: Clinical Measurements: Goal: Will remain free from infection Outcome: Progressing Goal: Diagnostic test results will improve Outcome: Progressing   Problem: Activity: Goal: Risk for activity intolerance will decrease Outcome: Progressing   Problem: Nutrition: Goal: Adequate nutrition will be maintained Outcome: Progressing   Problem: Coping: Goal: Level of anxiety will decrease Outcome: Progressing   Problem: Elimination: Goal: Will not experience complications related to bowel motility Outcome: Progressing Goal: Will not experience complications related to urinary retention Outcome: Progressing   Problem: Safety: Goal: Ability to remain free from injury will improve Outcome: Progressing   Problem: Skin Integrity: Goal: Risk for impaired skin integrity will decrease Outcome: Progressing   Problem: Activity: Goal: Ability to tolerate increased activity will improve Outcome: Progressing   Problem: Respiratory: Goal: Ability to maintain a clear airway will improve Outcome: Progressing Goal: Levels of oxygenation will improve Outcome: Progressing   Problem: Activity: Goal: Capacity to carry out activities will improve Outcome: Progressing

## 2023-11-27 DIAGNOSIS — I5023 Acute on chronic systolic (congestive) heart failure: Secondary | ICD-10-CM | POA: Diagnosis not present

## 2023-11-27 LAB — CBC
HCT: 27.8 % — ABNORMAL LOW (ref 39.0–52.0)
Hemoglobin: 8.5 g/dL — ABNORMAL LOW (ref 13.0–17.0)
MCH: 27.6 pg (ref 26.0–34.0)
MCHC: 30.6 g/dL (ref 30.0–36.0)
MCV: 90.3 fL (ref 80.0–100.0)
Platelets: 189 10*3/uL (ref 150–400)
RBC: 3.08 MIL/uL — ABNORMAL LOW (ref 4.22–5.81)
RDW: 15.6 % — ABNORMAL HIGH (ref 11.5–15.5)
WBC: 8.2 10*3/uL (ref 4.0–10.5)
nRBC: 0 % (ref 0.0–0.2)

## 2023-11-27 LAB — TSH: TSH: 0.401 u[IU]/mL (ref 0.350–4.500)

## 2023-11-27 LAB — BASIC METABOLIC PANEL WITH GFR
Anion gap: 10 (ref 5–15)
BUN: 69 mg/dL — ABNORMAL HIGH (ref 8–23)
CO2: 30 mmol/L (ref 22–32)
Calcium: 9.4 mg/dL (ref 8.9–10.3)
Chloride: 97 mmol/L — ABNORMAL LOW (ref 98–111)
Creatinine, Ser: 1.49 mg/dL — ABNORMAL HIGH (ref 0.61–1.24)
GFR, Estimated: 48 mL/min — ABNORMAL LOW (ref >60)
Glucose, Bld: 170 mg/dL — ABNORMAL HIGH (ref 70–99)
Potassium: 4.4 mmol/L (ref 3.5–5.1)
Sodium: 137 mmol/L (ref 135–145)

## 2023-11-27 MED ORDER — TORSEMIDE 20 MG PO TABS: 40.0000 mg | ORAL_TABLET | Freq: Every day | ORAL | 1 refills | Status: DC

## 2023-11-27 MED ORDER — ATORVASTATIN CALCIUM 80 MG PO TABS: 80.0000 mg | ORAL_TABLET | Freq: Every evening | ORAL | Status: DC

## 2023-11-27 MED ORDER — CARVEDILOL 12.5 MG PO TABS: 6.2500 mg | ORAL_TABLET | Freq: Two times a day (BID) | ORAL | Status: DC

## 2023-11-27 MED ORDER — HYDROCODONE-ACETAMINOPHEN 5-325 MG PO TABS
1.0000 | ORAL_TABLET | Freq: Four times a day (QID) | ORAL | Status: DC | PRN
  Administered 2023-11-27: 1 via ORAL
  Filled 2023-11-27: qty 1

## 2023-11-27 MED ORDER — PREDNISONE 20 MG PO TABS: 20.0000 mg | ORAL_TABLET | Freq: Every day | ORAL | 0 refills | Status: AC

## 2023-11-27 MED ORDER — AMIODARONE HCL 200 MG PO TABS: 200.0000 mg | ORAL_TABLET | Freq: Every day | ORAL | Status: DC

## 2023-11-27 MED ORDER — RIVAROXABAN 15 MG PO TABS: 15.0000 mg | ORAL_TABLET | Freq: Every day | ORAL | 1 refills | Status: DC

## 2023-11-27 MED ORDER — HYDROCODONE-ACETAMINOPHEN 5-325 MG PO TABS: 1.0000 | ORAL_TABLET | Freq: Two times a day (BID) | ORAL | 0 refills | Status: DC | PRN

## 2023-11-27 NOTE — Plan of Care (Signed)

## 2023-11-27 NOTE — Progress Notes (Signed)
   Patient Name: Richard Rivers Date of Encounter: 11/27/2023 [facility] Cardiologist: Rollene Rotunda, MD   Interval Summary  .    Feels much better.  No orthopnea.  With activity, breathing is pretty much back to normal. Only 240 mL of urine output recorded so far this morning, but he tells me that he has filled 8 or 9 "half jugs", pointing to his urinal.  Vital Signs .    Vitals:   11/27/23 0424 11/27/23 0704 11/27/23 0729 11/27/23 1054  BP: 95/83 114/73  (!) 121/59  Pulse: 68     Resp: 17     Temp: 98 F (36.7 C) 97.8 F (36.6 C)  98.6 F (37 C)  TempSrc: Oral Oral  Oral  SpO2: 100%  98%   Weight:      Height:        Intake/Output Summary (Last 24 hours) at 11/27/2023 1202 Last data filed at 11/27/2023 1055 Gross per 24 hour  Intake 1200 ml  Output 2750 ml  Net -1550 ml      11/26/2023    9:00 AM 11/24/2023    9:14 AM 11/24/2023    1:29 AM  Last 3 Weights  Weight (lbs) 148 lb 9.4 oz 153 lb 1.6 oz 149 lb 14.6 oz  Weight (kg) 67.4 kg 69.446 kg 68 kg      Telemetry/ECG    Sinus rhythm with occasional PVCs- Personally Reviewed No new ECG   Physical Exam .   GEN: No acute distress.   Neck: No JVD Cardiac: RRR, no murmurs, rubs, or gallops.  Respiratory: Clear to auscultation bilaterally. GI: Soft, nontender, non-distended  MS: No edema  Assessment & Plan .     78 y.o. male with HTN, HLD, chronic HFrEF, persistent A. 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 He has had several admissions here recently (09/2023, 10/2023) and planned to continue f/u at the [place]. He was readmitted with SOB And mild chest pain, felt to have acute on chronic HFrEF.   Sherryll Burger was stopped to allow diuresis and his dose of carvedilol was decreased.  Blood pressure is holding out well.  Renal parameters continue to improve.  Potassium in normal range.  [facility] will sign off.   Medication Recommendations: Continue to hold Entresto. Torsemide 40 mg daily Carvedilol 6.25 mg twice daily Continue amiodarone, atorvastatin, clopidogrel, Jardiance, Xarelto at usual home doses. Other recommendations (labs, testing, etc): Recheck a basic metabolic panel at the follow-up visit Follow up as an outpatient: He prefers to follow-up with the [place] system in [place], but we will go ahead and make him a 2-week appointment with [name] in case he cannot get in there in time.  Resume Entresto as an outpatient.   For questions or updates, please contact [facility] Please consult [email or web address] for contact info under        Signed, Thurmon Fair, MD

## 2023-11-27 NOTE — Progress Notes (Signed)
 Mobility Specialist Progress Note:   11/27/23 1000  Oxygen Therapy  O2 Device Nasal Cannula  O2 Flow Rate (L/min) 3 L/min  Mobility  Activity Ambulated with assistance in hallway  Level of Assistance Standby assist, set-up cues, supervision of patient - no hands on  Assistive Device Front wheel walker  Distance Ambulated (ft) 200 ft  Activity Response Tolerated well  Mobility Referral Yes  Mobility visit 1 Mobility  Mobility Specialist Start Time (ACUTE ONLY) [id number]  Mobility Specialist Stop Time (ACUTE ONLY) [id number]  Mobility Specialist Time Calculation (min) (ACUTE ONLY) 11 min   Received pt in bed having no complaints and agreeable to mobility. SpO2 in high 90s throughout on 3L O2.  Pt was asymptomatic throughout ambulation and returned to room w/o fault. Left in bed w/ call bell in reach and all needs met.   [place] Earlene Plater Mobility Specialist Please contact via Special educational needs teacher or Rehab office at [phone]

## 2023-11-27 NOTE — Progress Notes (Signed)
 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 -History of intermittent chest pains in the last few days, progressive dyspnea on exertion prior to that.  In the ER WBC 16, troponin 3519, BNP> 4600, creatinine 1.6, chest x-ray with pleural effusion and edema. -Prior to this recently hospitalized 2/15-2/21 for A-fib RVR NSTEMI and CHF exacerbation, LHC noted in-stent stenosis of left main, medical management was recommended then, discharged on Plavix Xarelto beta-blocker Imdur and statin as well as started on amiodarone   Subjective: -Feels much better, breathing close to baseline now  Assessment and Plan:  Acute on chronic systolic CHF Last echo 2/25 EF of 35 to 40%, grade 1 DD, normal RV, mild elevated PASP. -Diet reduced with IV Lasix, volume status has improved, 3 L negative -Continue Jardiance and Coreg, Entresto on hold -Cards following, starting oral torsemide today -Discharge planning, home later today or in a.m.  NSTEMI vs demand ischemia -Troponin peaked at 3519, now improving -sp IV heparin, back on xarelto, continue Coreg, Plavix and statin -LHC 2/25 with moderate in-stent stenosis of left main stent, medical management recommended then  Persistent A-fib Continue Coreg.,  Currently in sinus rhythm -Continue Xarelto   AKI Baseline creatinine 1.24 from 2/21.  Peaked at 1.8, now improving -Cardiorenal, Entresto held   COPD/chronic respiratory failure Mild COPD exacerbation -Definitely has component of significant COPD contributing to his baseline dyspnea, continue DuoNebs, flutter valve, add prednisone -afebrile, procalcitonin<0.1,  flu/COVID/RSV PCR negative -Monitor off ABX   H/o PAD/bypasses H/o carotid artery stenosis s/p L CEA -Continue Plavix and statin   Impaired mobility H/o gout, arthritis  Continue Neurontin Lives at home with his stepson, continues to smoke, has impaired mobility and uses walker. PT eval completed, add home health services at discharge   DVT prophylaxis: Xarelto Code Status: Full code Family Communication: None present Disposition Plan: Home later today or in a.m.  Consultants:    Procedures:   Antimicrobials:    Objective: Vitals:   11/27/23 0424 11/27/23 0704 11/27/23 0729 11/27/23 1054  BP: 95/83 114/73  (!) 121/59  Pulse: 68     Resp: 17     Temp: 98 F (36.7 C) 97.8 F (36.6 C)  98.6 F (37 C)  TempSrc: Oral Oral  Oral  SpO2: 100%  98%   Weight:      Height:        Intake/Output Summary (Last 24 hours) at 11/27/2023 1115 Last data filed at 11/27/2023 1055 Gross per 24 hour  Intake 1200 ml  Output 2750 ml  Net -1550 ml   Filed Weights   11/24/23 0129 11/24/23 0914 11/26/23 0900  Weight: 68 kg 69.4 kg 67.4 kg    Examination:  General exam: Chronically ill male sitting up in bed, AAOx3 HEENT: no JVD CVS: S1-S2, regular rhythm Lungs: Improving air movement, few scattered rhonchi Abdomen: Soft, nontender, bowel sounds present Extremities: no edema Skin: No rashes Psychiatry:  Mood & affect appropriate.     Data Reviewed:   CBC: Recent Labs  Lab 11/24/23 0140 11/25/23 0735 11/26/23 0220 11/27/23 0231  WBC 16.5* 16.1* 13.6* 8.2  HGB 10.6* 9.0* 8.8* 8.5*  HCT 34.3* 28.1* 28.7* 27.8*  MCV 92.0 90.4 91.4 90.3  PLT 233 193 183 189   Basic Metabolic Panel: Recent Labs  Lab 11/24/23 0140 11/25/23 0735 11/26/23 0220 11/27/23 0231  NA 137 139 138 137  K 4.5 4.4 4.2 4.4  CL 102 101 101 97*  CO2 23 28 29 30   GLUCOSE 138* 141* 96 170*  BUN 29* 61* 63* 69*  CREATININE 1.64* 1.85* 1.55* 1.49*  CALCIUM 10.3 9.5 9.3 9.4   GFR: Estimated  Creatinine Clearance: 39 mL/min (A) (by C-G formula based on SCr of 1.49 mg/dL (H)). Liver Function Tests: Recent Labs  Lab 11/24/23 0140  AST 42*  ALT 20  ALKPHOS 140*  BILITOT 0.9  PROT 7.1  ALBUMIN 3.2*   No results for input(s): "LIPASE", "AMYLASE" in the last 168 hours. No results for input(s): "AMMONIA" in the last 168 hours. Coagulation Profile: No results for input(s): "INR", "PROTIME" in the last 168 hours. Cardiac Enzymes: No results for input(s): "CKTOTAL", "CKMB", "CKMBINDEX", "TROPONINI" in the last 168 hours. BNP (last 3 results) No results for input(s): "PROBNP" in the last 8760 hours. HbA1C: No results for input(s): "HGBA1C" in the last 72 hours. CBG: No results for input(s): "GLUCAP" in the last 168 hours. Lipid Profile: No results for input(s): "CHOL", "HDL", "LDLCALC", "TRIG", "CHOLHDL", "LDLDIRECT" in the last 72 hours. Thyroid Function Tests: Recent Labs    11/27/23 0231  TSH 0.401   Anemia Panel: No results for input(s): "VITAMINB12", "FOLATE", "FERRITIN", "TIBC", "IRON", "RETICCTPCT" in the last 72 hours. Urine analysis:    Component Value Date/Time   COLORURINE YELLOW 10/05/2009 1342   APPEARANCEUR CLOUDY (A) 10/05/2009 1342   LABSPEC 1.010 10/05/2009 1342   PHURINE 8.5 (H) 10/05/2009 1342   GLUCOSEU NEGATIVE 10/05/2009 1342   HGBUR NEGATIVE 10/05/2009 1342   BILIRUBINUR NEGATIVE 10/05/2009 1342   KETONESUR NEGATIVE 10/05/2009 1342   PROTEINUR NEGATIVE 10/05/2009 1342   UROBILINOGEN 1.0 10/05/2009 1342   NITRITE NEGATIVE 10/05/2009 1342   LEUKOCYTESUR  10/05/2009 1342    NEGATIVE MICROSCOPIC NOT DONE ON URINES WITH NEGATIVE PROTEIN, BLOOD, LEUKOCYTES, NITRITE, OR GLUCOSE <1000 mg/dL.   Sepsis Labs: @LABRCNTIP (procalcitonin:4,lacticidven:4)  ) Recent Results (from the past 240 hours)  Resp panel by RT-PCR (RSV, Flu A&B, Covid) Anterior Nasal Swab     Status: None   Collection Time: 11/24/23  1:40 AM   Specimen: Anterior Nasal Swab   Result Value Ref Range Status   SARS Coronavirus 2 by RT PCR NEGATIVE NEGATIVE Final   Influenza A by PCR NEGATIVE NEGATIVE Final   Influenza B by PCR NEGATIVE NEGATIVE Final    Comment: (NOTE) The Xpert Xpress SARS-CoV-2/FLU/RSV plus assay is intended as an aid in the diagnosis of influenza from Nasopharyngeal swab specimens and should not be used as a sole basis for treatment. Nasal washings and aspirates are unacceptable for Xpert Xpress SARS-CoV-2/FLU/RSV testing.  Fact Sheet for Patients: BloggerCourse.com  Fact Sheet for Healthcare Providers: SeriousBroker.it  This test is not yet approved or cleared by the [place] FDA and has been authorized for detection and/or diagnosis of SARS-CoV-2 by FDA under an Emergency Use Authorization (EUA). This EUA will remain in effect (meaning this test can be used) for the duration of the COVID-19 declaration under Section 564(b)(1) of the Act, 21 U.S.C. section 360bbb-3(b)(1), unless the authorization is terminated or revoked.     Resp Syncytial Virus by PCR NEGATIVE NEGATIVE Final    Comment: (NOTE) Fact Sheet for Patients: BloggerCourse.com  Fact Sheet for Healthcare Providers: SeriousBroker.it  This test is not yet approved or cleared by the [place] and has been authorized for detection and/or diagnosis of SARS-CoV-2 by FDA under an Emergency Use Authorization (EUA). This EUA will remain in effect (meaning this test can be used) for the duration of the COVID-19 declaration under Section 564(b)(1) of the Act, 21 U.S.C. section 360bbb-3(b)(1), unless the authorization is terminated or revoked.  Performed at [facility], [address] [place], [place] [address]   MRSA Next Gen by PCR, Nasal     Status: None   Collection Time: 11/24/23  9:18 AM   Specimen: Nasal Mucosa; Nasal Swab  Result Value Ref Range  Status   MRSA by PCR Next Gen NOT DETECTED NOT DETECTED Final    Comment: (NOTE) The GeneXpert MRSA Assay (FDA approved for NASAL specimens only), is one component of a comprehensive MRSA colonization surveillance program. It is not intended to diagnose MRSA infection nor to guide or monitor treatment for MRSA infections. Test performance is not FDA approved in patients less than 60 years old. Performed at [facility], [address], [place], [place] [address]      Radiology Studies: No results found.    Scheduled Meds:  allopurinol  300 mg Oral Daily   amiodarone  200 mg Oral Daily   atorvastatin  80 mg Oral QHS   carvedilol  6.25 mg Oral BID WC   clopidogrel  75 mg Oral Daily   empagliflozin  10 mg Oral Daily   feeding supplement  237 mL Oral BID BM   gabapentin  600 mg Oral BID   ipratropium-albuterol  3 mL Nebulization TID   predniSONE  40 mg Oral Q breakfast   rivaroxaban  15 mg Oral Q supper   torsemide  40 mg Oral Daily   Continuous Infusions:     LOS: 3 days    Time spent:    Zannie Cove, MD [facility]   11/27/2023, 11:15 AM

## 2023-11-27 NOTE — Discharge Summary (Signed)
 Physician Discharge Summary  Richard Rivers VWU:[id number] DOB: 07-12-1946 DOA: 11/24/2023  PCP: [facility]  Admit date: 11/24/2023 Discharge date: 11/27/2023  Time spent: 45 minutes  Recommendations for Outpatient Follow-up:  [facility] in 2 weeks Outpatient palliative care   Discharge Diagnoses:   Acute on chronic systolic CHF NSTEMI CAD Chronic respiratory failure on 3 L home O2 Persistent atrial fibrillation Acute kidney injury Advanced COPD History of PAD Carotid artery disease Impaired mobility Gout Osteoarthritis   Discharge Condition: Improved  Diet recommendation: Low-sodium, heart healthy  Filed Weights   11/24/23 0129 11/24/23 0914 11/26/23 0900  Weight: 68 kg 69.4 kg 67.4 kg    History of present illness:  78/M with 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 -History of intermittent chest pains in the last few days, progressive dyspnea on exertion prior to that.  In the ER WBC 16, troponin 3519, BNP> 4600, creatinine 1.6, chest x-ray with pleural effusion and edema. -Prior to this recently hospitalized 2/15-2/21 for A-fib RVR NSTEMI and CHF exacerbation, LHC noted in-stent stenosis of left main, medical management was recommended then, discharged on Plavix Xarelto beta-blocker Imdur and statin as well as started on amiodarone  Hospital Course:   Acute on chronic systolic CHF Last echo 2/25 EF of 35 to 40%, grade 1 DD, normal RV, mild elevated PASP. -Diet reduced with IV Lasix, volume status has improved,  -Continue Jardiance and Coreg, Entresto held -Cards following, starting oral torsemide 40mg  daily -FU with [facility] in 2 weeks   NSTEMI vs demand ischemia -Troponin peaked at 3519, now improving -sp IV  heparin, back on xarelto, continue Coreg, Plavix and statin -LHC 2/25 with moderate in-stent stenosis of left main stent, medical management recommended then   Persistent A-fib Continue Coreg.,  Currently in sinus rhythm -Continue Xarelto   AKI Baseline creatinine 1.24 from 2/21.  Peaked at 1.8, now improving -Cardiorenal, Entresto held   COPD/chronic respiratory failure Mild COPD exacerbation -Definitely has component of significant COPD contributing to his baseline dyspnea, continue DuoNebs, flutter valve, add prednisone -afebrile, procalcitonin<0.1, flu/COVID/RSV PCR negative -Monitor off ABX   H/o PAD/bypasses H/o carotid artery stenosis s/p L CEA -Continue Plavix and statin   Impaired mobility H/o gout, arthritis  Continue Neurontin Lives at home with his stepson, continues to smoke, has impaired mobility and uses walker. PT eval completed, add home health services at discharge    Discharge Exam: Vitals:   11/27/23 0729 11/27/23 1054  BP:  (!) 121/59  Pulse:    Resp:    Temp:  98.6 F (37 C)  SpO2: 98%    General exam: Chronically ill male sitting up in bed, AAOx3 HEENT: no JVD CVS: S1-S2, regular rhythm Lungs: Improving air movement, few scattered rhonchi Abdomen: Soft, nontender, bowel sounds present Extremities: no edema Skin: No rashes Psychiatry:  Mood & affect appropriate.     Discharge Instructions    Allergies as of 11/27/2023       Reactions   Codeine Itching, Swelling   Throat swelling   Penicillin G Swelling   Throat swelling        Medication List     STOP taking these medications    isosorbide mononitrate 30 MG 24 hr tablet Commonly known as: IMDUR  sacubitril-valsartan 49-51 MG Commonly known as: ENTRESTO       TAKE these medications    acetaminophen 500 MG tablet Commonly known as: TYLENOL Take 500 mg by mouth 2 (two) times daily as needed for moderate pain (pain score 4-6), headache or fever.   allopurinol 300 MG  tablet Commonly known as: ZYLOPRIM Take 300 mg by mouth every evening.   amiodarone 200 MG tablet Commonly known as: PACERONE Take 1 tablet (200 mg total) by mouth daily. Start taking on: November 28, 2023 What changed: See the new instructions.   ammonium lactate 12 % lotion Commonly known as: LAC-HYDRIN Apply 1 Application topically 2 (two) times daily as needed. APPLY TO DRY SKIN ON FEET   atorvastatin 80 MG tablet Commonly known as: LIPITOR Take 1 tablet (80 mg total) by mouth every evening. What changed: how much to take   busPIRone 10 MG tablet Commonly known as: BUSPAR Take 10 mg by mouth 2 (two) times daily as needed (anxiety).   Calcium 600+D3 600-10 MG-MCG Tabs Generic drug: Calcium Carb-Cholecalciferol Take 1 tablet by mouth 2 (two) times daily.   carvedilol 12.5 MG tablet Commonly known as: COREG Take 0.5 tablets (6.25 mg total) by mouth 2 (two) times daily with a meal. What changed: how much to take   clopidogrel 75 MG tablet Commonly known as: PLAVIX Take 1 tablet (75 mg total) by mouth daily. What changed: when to take this   Combivent Respimat 20-100 MCG/ACT Aers respimat Generic drug: Ipratropium-Albuterol Inhale 1 puff into the lungs 4 (four) times daily.   empagliflozin 25 MG Tabs tablet Commonly known as: JARDIANCE Take 12.5 mg by mouth every evening.   escitalopram 10 MG tablet Commonly known as: LEXAPRO Take 10 mg by mouth every evening.   famotidine 20 MG tablet Commonly known as: PEPCID Take 20 mg by mouth every evening.   fluticasone 50 MCG/ACT nasal spray Commonly known as: FLONASE Place 2 sprays into both nostrils daily as needed for allergies or rhinitis.   gabapentin 300 MG capsule Commonly known as: NEURONTIN Take 600 mg by mouth 2 (two) times daily.   nitroGLYCERIN 0.4 MG SL tablet Commonly known as: NITROSTAT Place 1 tablet (0.4 mg total) under the tongue every 5 (five) minutes x 3 doses as needed for chest pain.    predniSONE 20 MG tablet Commonly known as: DELTASONE Take 1 tablet (20 mg total) by mouth daily with breakfast for 3 days. Start taking on: November 28, 2023   rivaroxaban 20 MG Tabs tablet Commonly known as: XARELTO Take 20 mg by mouth every evening.   torsemide 20 MG tablet Commonly known as: DEMADEX Take 2 tablets (40 mg total) by mouth daily. What changed: how much to take   traZODone 50 MG tablet Commonly known as: DESYREL Take 100 mg by mouth at bedtime.       Allergies  Allergen Reactions   Codeine Itching and Swelling    Throat swelling   Penicillin G Swelling    Throat swelling      The results of significant diagnostics from this hospitalization (including imaging, microbiology, ancillary and laboratory) are listed below for reference.    Significant Diagnostic Studies: DG Chest 2 View Result Date: 11/24/2023 CLINICAL DATA:  Shortness of breath for 1 day, worsening. Diagnosed with pneumonia 1 month ago. EXAM: CHEST - 2 VIEW COMPARISON:  10/16/2023 FINDINGS: Cardiac enlargement. Small bilateral pleural effusions. Perihilar and basilar infiltration on the left. Mild infiltration in the right base. Appearances are  similar to previous study, possibly representing residual pneumonia or edema. Calcification of the aorta. No pneumothorax. IMPRESSION: 1. Cardiac enlargement. 2. Small bilateral pleural effusions. 3. Bilateral pulmonary infiltrates, greater on the left. Appearances are similar to prior study, possibly pneumonia or edema. Electronically Signed   By: Burman Nieves M.D.   On: 11/24/2023 02:08    Microbiology: Recent Results (from the past 240 hours)  Resp panel by RT-PCR (RSV, Flu A&B, Covid) Anterior Nasal Swab     Status: None   Collection Time: 11/24/23  1:40 AM   Specimen: Anterior Nasal Swab  Result Value Ref Range Status   SARS Coronavirus 2 by RT PCR NEGATIVE NEGATIVE Final   Influenza A by PCR NEGATIVE NEGATIVE Final   Influenza B by PCR NEGATIVE  NEGATIVE Final    Comment: (NOTE) The Xpert Xpress SARS-CoV-2/FLU/RSV plus assay is intended as an aid in the diagnosis of influenza from Nasopharyngeal swab specimens and should not be used as a sole basis for treatment. Nasal washings and aspirates are unacceptable for Xpert Xpress SARS-CoV-2/FLU/RSV testing.  Fact Sheet for Patients: BloggerCourse.com  Fact Sheet for Healthcare Providers: SeriousBroker.it  This test is not yet approved or cleared by the [place] FDA and has been authorized for detection and/or diagnosis of SARS-CoV-2 by FDA under an Emergency Use Authorization (EUA). This EUA will remain in effect (meaning this test can be used) for the duration of the COVID-19 declaration under Section 564(b)(1) of the Act, 21 U.S.C. section 360bbb-3(b)(1), unless the authorization is terminated or revoked.     Resp Syncytial Virus by PCR NEGATIVE NEGATIVE Final    Comment: (NOTE) Fact Sheet for Patients: BloggerCourse.com  Fact Sheet for Healthcare Providers: SeriousBroker.it  This test is not yet approved or cleared by the [place] FDA and has been authorized for detection and/or diagnosis of SARS-CoV-2 by FDA under an Emergency Use Authorization (EUA). This EUA will remain in effect (meaning this test can be used) for the duration of the COVID-19 declaration under Section 564(b)(1) of the Act, 21 U.S.C. section 360bbb-3(b)(1), unless the authorization is terminated or revoked.  Performed at [facility], [address] [place], [place] [address]   MRSA Next Gen by PCR, Nasal     Status: None   Collection Time: 11/24/23  9:18 AM   Specimen: Nasal Mucosa; Nasal Swab  Result Value Ref Range Status   MRSA by PCR Next Gen NOT DETECTED NOT DETECTED Final    Comment: (NOTE) The GeneXpert MRSA Assay (FDA approved for NASAL specimens only), is one component  of a comprehensive MRSA colonization surveillance program. It is not intended to diagnose MRSA infection nor to guide or monitor treatment for MRSA infections. Test performance is not FDA approved in patients less than 28 years old. Performed at [facility], [address], [place], [place] [address]      Labs: Basic Metabolic Panel: Recent Labs  Lab 11/24/23 0140 11/25/23 0735 11/26/23 0220 11/27/23 0231  NA 137 139 138 137  K 4.5 4.4 4.2 4.4  CL 102 101 101 97*  CO2 23 28 29 30   GLUCOSE 138* 141* 96 170*  BUN 29* 61* 63* 69*  CREATININE 1.64* 1.85* 1.55* 1.49*  CALCIUM 10.3 9.5 9.3 9.4   Liver Function Tests: Recent Labs  Lab 11/24/23 0140  AST 42*  ALT 20  ALKPHOS 140*  BILITOT 0.9  PROT 7.1  ALBUMIN 3.2*   No results for input(s): "LIPASE", "AMYLASE" in the last  168 hours. No results for input(s): "AMMONIA" in the last 168 hours. CBC: Recent Labs  Lab 11/24/23 0140 11/25/23 0735 11/26/23 0220 11/27/23 0231  WBC 16.5* 16.1* 13.6* 8.2  HGB 10.6* 9.0* 8.8* 8.5*  HCT 34.3* 28.1* 28.7* 27.8*  MCV 92.0 90.4 91.4 90.3  PLT 233 193 183 189   Cardiac Enzymes: No results for input(s): "CKTOTAL", "CKMB", "CKMBINDEX", "TROPONINI" in the last 168 hours. BNP: BNP (last 3 results) Recent Labs    09/12/23 1328 10/16/23 0653 11/24/23 0140  BNP 2,799.0* 910.2* >4,500.0*    ProBNP (last 3 results) No results for input(s): "PROBNP" in the last 8760 hours.  CBG: No results for input(s): "GLUCAP" in the last 168 hours.     Signed:  Zannie Cove MD.  [facility] 11/27/2023, 11:21 AM

## 2024-01-17 ENCOUNTER — Emergency Department (HOSPITAL_COMMUNITY)

## 2024-01-17 ENCOUNTER — Other Ambulatory Visit: Payer: Self-pay

## 2024-01-17 ENCOUNTER — Inpatient Hospital Stay (HOSPITAL_COMMUNITY)
Admission: EM | Admit: 2024-01-17 | Discharge: 2024-01-30 | DRG: 291 | Disposition: E | Attending: Internal Medicine | Admitting: Internal Medicine

## 2024-01-17 DIAGNOSIS — Z951 Presence of aortocoronary bypass graft: Secondary | ICD-10-CM

## 2024-01-17 DIAGNOSIS — K7460 Unspecified cirrhosis of liver: Secondary | ICD-10-CM | POA: Diagnosis present

## 2024-01-17 DIAGNOSIS — R7989 Other specified abnormal findings of blood chemistry: Secondary | ICD-10-CM

## 2024-01-17 DIAGNOSIS — E1151 Type 2 diabetes mellitus with diabetic peripheral angiopathy without gangrene: Secondary | ICD-10-CM | POA: Diagnosis present

## 2024-01-17 DIAGNOSIS — D509 Iron deficiency anemia, unspecified: Secondary | ICD-10-CM | POA: Diagnosis present

## 2024-01-17 DIAGNOSIS — I447 Left bundle-branch block, unspecified: Secondary | ICD-10-CM | POA: Diagnosis present

## 2024-01-17 DIAGNOSIS — I130 Hypertensive heart and chronic kidney disease with heart failure and stage 1 through stage 4 chronic kidney disease, or unspecified chronic kidney disease: Principal | ICD-10-CM | POA: Diagnosis present

## 2024-01-17 DIAGNOSIS — Z8249 Family history of ischemic heart disease and other diseases of the circulatory system: Secondary | ICD-10-CM

## 2024-01-17 DIAGNOSIS — J9611 Chronic respiratory failure with hypoxia: Secondary | ICD-10-CM | POA: Diagnosis present

## 2024-01-17 DIAGNOSIS — I428 Other cardiomyopathies: Secondary | ICD-10-CM | POA: Diagnosis present

## 2024-01-17 DIAGNOSIS — I252 Old myocardial infarction: Secondary | ICD-10-CM

## 2024-01-17 DIAGNOSIS — R54 Age-related physical debility: Secondary | ICD-10-CM | POA: Diagnosis present

## 2024-01-17 DIAGNOSIS — I739 Peripheral vascular disease, unspecified: Secondary | ICD-10-CM | POA: Diagnosis present

## 2024-01-17 DIAGNOSIS — Z515 Encounter for palliative care: Secondary | ICD-10-CM

## 2024-01-17 DIAGNOSIS — I959 Hypotension, unspecified: Secondary | ICD-10-CM

## 2024-01-17 DIAGNOSIS — I5043 Acute on chronic combined systolic (congestive) and diastolic (congestive) heart failure: Secondary | ICD-10-CM | POA: Diagnosis present

## 2024-01-17 DIAGNOSIS — I10 Essential (primary) hypertension: Secondary | ICD-10-CM | POA: Diagnosis present

## 2024-01-17 DIAGNOSIS — Z7984 Long term (current) use of oral hypoglycemic drugs: Secondary | ICD-10-CM

## 2024-01-17 DIAGNOSIS — Z885 Allergy status to narcotic agent status: Secondary | ICD-10-CM

## 2024-01-17 DIAGNOSIS — I5021 Acute systolic (congestive) heart failure: Principal | ICD-10-CM

## 2024-01-17 DIAGNOSIS — Z7901 Long term (current) use of anticoagulants: Secondary | ICD-10-CM

## 2024-01-17 DIAGNOSIS — J449 Chronic obstructive pulmonary disease, unspecified: Secondary | ICD-10-CM | POA: Diagnosis present

## 2024-01-17 DIAGNOSIS — I509 Heart failure, unspecified: Secondary | ICD-10-CM | POA: Diagnosis not present

## 2024-01-17 DIAGNOSIS — Z880 Allergy status to penicillin: Secondary | ICD-10-CM

## 2024-01-17 DIAGNOSIS — F17210 Nicotine dependence, cigarettes, uncomplicated: Secondary | ICD-10-CM | POA: Diagnosis present

## 2024-01-17 DIAGNOSIS — Z7951 Long term (current) use of inhaled steroids: Secondary | ICD-10-CM

## 2024-01-17 DIAGNOSIS — S36119A Unspecified injury of liver, initial encounter: Secondary | ICD-10-CM

## 2024-01-17 DIAGNOSIS — N179 Acute kidney failure, unspecified: Secondary | ICD-10-CM | POA: Diagnosis not present

## 2024-01-17 DIAGNOSIS — Z79899 Other long term (current) drug therapy: Secondary | ICD-10-CM

## 2024-01-17 DIAGNOSIS — E1165 Type 2 diabetes mellitus with hyperglycemia: Secondary | ICD-10-CM | POA: Diagnosis present

## 2024-01-17 DIAGNOSIS — N170 Acute kidney failure with tubular necrosis: Secondary | ICD-10-CM | POA: Diagnosis present

## 2024-01-17 DIAGNOSIS — Z8041 Family history of malignant neoplasm of ovary: Secondary | ICD-10-CM

## 2024-01-17 DIAGNOSIS — I5084 End stage heart failure: Secondary | ICD-10-CM | POA: Diagnosis present

## 2024-01-17 DIAGNOSIS — Z9981 Dependence on supplemental oxygen: Secondary | ICD-10-CM

## 2024-01-17 DIAGNOSIS — M109 Gout, unspecified: Secondary | ICD-10-CM | POA: Diagnosis present

## 2024-01-17 DIAGNOSIS — I5023 Acute on chronic systolic (congestive) heart failure: Secondary | ICD-10-CM | POA: Diagnosis present

## 2024-01-17 DIAGNOSIS — G629 Polyneuropathy, unspecified: Secondary | ICD-10-CM | POA: Diagnosis present

## 2024-01-17 DIAGNOSIS — D696 Thrombocytopenia, unspecified: Secondary | ICD-10-CM | POA: Diagnosis present

## 2024-01-17 DIAGNOSIS — R570 Cardiogenic shock: Secondary | ICD-10-CM | POA: Diagnosis present

## 2024-01-17 DIAGNOSIS — E1122 Type 2 diabetes mellitus with diabetic chronic kidney disease: Secondary | ICD-10-CM | POA: Diagnosis present

## 2024-01-17 DIAGNOSIS — R339 Retention of urine, unspecified: Secondary | ICD-10-CM | POA: Diagnosis present

## 2024-01-17 DIAGNOSIS — R64 Cachexia: Secondary | ICD-10-CM | POA: Diagnosis present

## 2024-01-17 DIAGNOSIS — E785 Hyperlipidemia, unspecified: Secondary | ICD-10-CM | POA: Diagnosis present

## 2024-01-17 DIAGNOSIS — Z7902 Long term (current) use of antithrombotics/antiplatelets: Secondary | ICD-10-CM

## 2024-01-17 DIAGNOSIS — Z801 Family history of malignant neoplasm of trachea, bronchus and lung: Secondary | ICD-10-CM

## 2024-01-17 DIAGNOSIS — I2489 Other forms of acute ischemic heart disease: Secondary | ICD-10-CM | POA: Diagnosis present

## 2024-01-17 DIAGNOSIS — Z95820 Peripheral vascular angioplasty status with implants and grafts: Secondary | ICD-10-CM

## 2024-01-17 DIAGNOSIS — Z66 Do not resuscitate: Secondary | ICD-10-CM | POA: Diagnosis not present

## 2024-01-17 DIAGNOSIS — Z6821 Body mass index (BMI) 21.0-21.9, adult: Secondary | ICD-10-CM

## 2024-01-17 DIAGNOSIS — I4819 Other persistent atrial fibrillation: Secondary | ICD-10-CM | POA: Diagnosis present

## 2024-01-17 DIAGNOSIS — Z7189 Other specified counseling: Secondary | ICD-10-CM

## 2024-01-17 DIAGNOSIS — I2510 Atherosclerotic heart disease of native coronary artery without angina pectoris: Secondary | ICD-10-CM | POA: Diagnosis present

## 2024-01-17 DIAGNOSIS — I4892 Unspecified atrial flutter: Secondary | ICD-10-CM | POA: Diagnosis present

## 2024-01-17 DIAGNOSIS — E876 Hypokalemia: Secondary | ICD-10-CM | POA: Diagnosis present

## 2024-01-17 DIAGNOSIS — D631 Anemia in chronic kidney disease: Secondary | ICD-10-CM | POA: Diagnosis present

## 2024-01-17 DIAGNOSIS — Z604 Social exclusion and rejection: Secondary | ICD-10-CM | POA: Diagnosis present

## 2024-01-17 DIAGNOSIS — N183 Chronic kidney disease, stage 3 unspecified: Secondary | ICD-10-CM | POA: Diagnosis present

## 2024-01-17 DIAGNOSIS — Z955 Presence of coronary angioplasty implant and graft: Secondary | ICD-10-CM

## 2024-01-17 LAB — CBC WITH DIFFERENTIAL/PLATELET
Abs Immature Granulocytes: 0.03 10*3/uL (ref 0.00–0.07)
Basophils Absolute: 0 10*3/uL (ref 0.0–0.1)
Basophils Relative: 0 %
Eosinophils Absolute: 0 10*3/uL (ref 0.0–0.5)
Eosinophils Relative: 0 %
HCT: 24.6 % — ABNORMAL LOW (ref 39.0–52.0)
Hemoglobin: 7.1 g/dL — ABNORMAL LOW (ref 13.0–17.0)
Immature Granulocytes: 0 %
Lymphocytes Relative: 13 %
Lymphs Abs: 1.2 10*3/uL (ref 0.7–4.0)
MCH: 24.7 pg — ABNORMAL LOW (ref 26.0–34.0)
MCHC: 28.9 g/dL — ABNORMAL LOW (ref 30.0–36.0)
MCV: 85.7 fL (ref 80.0–100.0)
Monocytes Absolute: 0.9 10*3/uL (ref 0.1–1.0)
Monocytes Relative: 9 %
Neutro Abs: 7.6 10*3/uL (ref 1.7–7.7)
Neutrophils Relative %: 78 %
Platelets: 86 10*3/uL — ABNORMAL LOW (ref 150–400)
RBC: 2.87 MIL/uL — ABNORMAL LOW (ref 4.22–5.81)
RDW: 16.7 % — ABNORMAL HIGH (ref 11.5–15.5)
WBC: 9.8 10*3/uL (ref 4.0–10.5)
nRBC: 0.4 % — ABNORMAL HIGH (ref 0.0–0.2)

## 2024-01-17 LAB — COMPREHENSIVE METABOLIC PANEL WITH GFR
ALT: 359 U/L — ABNORMAL HIGH (ref 0–44)
AST: 218 U/L — ABNORMAL HIGH (ref 15–41)
Albumin: 3.1 g/dL — ABNORMAL LOW (ref 3.5–5.0)
Alkaline Phosphatase: 141 U/L — ABNORMAL HIGH (ref 38–126)
Anion gap: 17 — ABNORMAL HIGH (ref 5–15)
BUN: 111 mg/dL — ABNORMAL HIGH (ref 8–23)
CO2: 25 mmol/L (ref 22–32)
Calcium: 9.6 mg/dL (ref 8.9–10.3)
Chloride: 97 mmol/L — ABNORMAL LOW (ref 98–111)
Creatinine, Ser: 2.8 mg/dL — ABNORMAL HIGH (ref 0.61–1.24)
GFR, Estimated: 22 mL/min — ABNORMAL LOW (ref >60)
Glucose, Bld: 112 mg/dL — ABNORMAL HIGH (ref 70–99)
Potassium: 4.2 mmol/L (ref 3.5–5.1)
Sodium: 139 mmol/L (ref 135–145)
Total Bilirubin: 1.7 mg/dL — ABNORMAL HIGH (ref 0.0–1.2)
Total Protein: 6.4 g/dL — ABNORMAL LOW (ref 6.5–8.1)

## 2024-01-17 LAB — I-STAT VENOUS BLOOD GAS, ED
Acid-Base Excess: 7 mmol/L — ABNORMAL HIGH (ref 0.0–2.0)
Bicarbonate: 31.7 mmol/L — ABNORMAL HIGH (ref 20.0–28.0)
Calcium, Ion: 1.17 mmol/L (ref 1.15–1.40)
HCT: 28 % — ABNORMAL LOW (ref 39.0–52.0)
Hemoglobin: 9.5 g/dL — ABNORMAL LOW (ref 13.0–17.0)
O2 Saturation: 83 %
Potassium: 4 mmol/L (ref 3.5–5.1)
Sodium: 137 mmol/L (ref 135–145)
TCO2: 33 mmol/L — ABNORMAL HIGH (ref 22–32)
pCO2, Ven: 44 mmHg (ref 44–60)
pH, Ven: 7.465 — ABNORMAL HIGH (ref 7.25–7.43)
pO2, Ven: 45 mmHg (ref 32–45)

## 2024-01-17 LAB — BRAIN NATRIURETIC PEPTIDE: B Natriuretic Peptide: >4500 pg/mL — ABNORMAL HIGH (ref 0.0–100.0)

## 2024-01-17 LAB — TROPONIN I (HIGH SENSITIVITY): Troponin I (High Sensitivity): 400 ng/L (ref <18)

## 2024-01-17 MED ORDER — FUROSEMIDE 10 MG/ML IJ SOLN
40.0000 mg | Freq: Once | INTRAMUSCULAR | Status: AC
  Administered 2024-01-17: 40 mg via INTRAVENOUS
  Filled 2024-01-17: qty 4
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
## 2024-01-17 NOTE — ED Triage Notes (Signed)
 Pt BIB EMS from home w/ complaints of SOB x2 weeks. Per EMS, on their arrival pt was 88% on RA, suppose to wear 4L O2 at baseline but noncompliant. Pt also reports a cough x2 weeks also. 2 Duonebs and albuterol  given en route. Hx of COPD.
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
## 2024-01-18 ENCOUNTER — Encounter (HOSPITAL_COMMUNITY): Payer: Self-pay | Admitting: Internal Medicine

## 2024-01-18 ENCOUNTER — Inpatient Hospital Stay (HOSPITAL_COMMUNITY)

## 2024-01-18 ENCOUNTER — Other Ambulatory Visit: Payer: Self-pay

## 2024-01-18 DIAGNOSIS — E1151 Type 2 diabetes mellitus with diabetic peripheral angiopathy without gangrene: Secondary | ICD-10-CM | POA: Diagnosis present

## 2024-01-18 DIAGNOSIS — J42 Unspecified chronic bronchitis: Secondary | ICD-10-CM

## 2024-01-18 DIAGNOSIS — I509 Heart failure, unspecified: Secondary | ICD-10-CM | POA: Insufficient documentation

## 2024-01-18 DIAGNOSIS — I480 Paroxysmal atrial fibrillation: Secondary | ICD-10-CM

## 2024-01-18 DIAGNOSIS — I428 Other cardiomyopathies: Secondary | ICD-10-CM | POA: Diagnosis present

## 2024-01-18 DIAGNOSIS — I2510 Atherosclerotic heart disease of native coronary artery without angina pectoris: Secondary | ICD-10-CM

## 2024-01-18 DIAGNOSIS — I5043 Acute on chronic combined systolic (congestive) and diastolic (congestive) heart failure: Secondary | ICD-10-CM | POA: Diagnosis present

## 2024-01-18 DIAGNOSIS — I9589 Other hypotension: Secondary | ICD-10-CM | POA: Diagnosis not present

## 2024-01-18 DIAGNOSIS — R64 Cachexia: Secondary | ICD-10-CM | POA: Diagnosis present

## 2024-01-18 DIAGNOSIS — I483 Typical atrial flutter: Secondary | ICD-10-CM

## 2024-01-18 DIAGNOSIS — I5084 End stage heart failure: Secondary | ICD-10-CM | POA: Diagnosis present

## 2024-01-18 DIAGNOSIS — E1165 Type 2 diabetes mellitus with hyperglycemia: Secondary | ICD-10-CM | POA: Diagnosis present

## 2024-01-18 DIAGNOSIS — I4819 Other persistent atrial fibrillation: Secondary | ICD-10-CM | POA: Diagnosis present

## 2024-01-18 DIAGNOSIS — N170 Acute kidney failure with tubular necrosis: Secondary | ICD-10-CM | POA: Diagnosis present

## 2024-01-18 DIAGNOSIS — N183 Chronic kidney disease, stage 3 unspecified: Secondary | ICD-10-CM | POA: Diagnosis present

## 2024-01-18 DIAGNOSIS — I5023 Acute on chronic systolic (congestive) heart failure: Secondary | ICD-10-CM

## 2024-01-18 DIAGNOSIS — S36119A Unspecified injury of liver, initial encounter: Secondary | ICD-10-CM

## 2024-01-18 DIAGNOSIS — Z66 Do not resuscitate: Secondary | ICD-10-CM | POA: Diagnosis not present

## 2024-01-18 DIAGNOSIS — I25119 Atherosclerotic heart disease of native coronary artery with unspecified angina pectoris: Secondary | ICD-10-CM

## 2024-01-18 DIAGNOSIS — R7989 Other specified abnormal findings of blood chemistry: Secondary | ICD-10-CM | POA: Insufficient documentation

## 2024-01-18 DIAGNOSIS — I4892 Unspecified atrial flutter: Secondary | ICD-10-CM | POA: Diagnosis present

## 2024-01-18 DIAGNOSIS — Z7189 Other specified counseling: Secondary | ICD-10-CM | POA: Diagnosis not present

## 2024-01-18 DIAGNOSIS — J449 Chronic obstructive pulmonary disease, unspecified: Secondary | ICD-10-CM | POA: Diagnosis present

## 2024-01-18 DIAGNOSIS — E1122 Type 2 diabetes mellitus with diabetic chronic kidney disease: Secondary | ICD-10-CM | POA: Diagnosis present

## 2024-01-18 DIAGNOSIS — D696 Thrombocytopenia, unspecified: Secondary | ICD-10-CM | POA: Diagnosis present

## 2024-01-18 DIAGNOSIS — I739 Peripheral vascular disease, unspecified: Secondary | ICD-10-CM

## 2024-01-18 DIAGNOSIS — D509 Iron deficiency anemia, unspecified: Secondary | ICD-10-CM | POA: Diagnosis present

## 2024-01-18 DIAGNOSIS — I959 Hypotension, unspecified: Secondary | ICD-10-CM | POA: Diagnosis not present

## 2024-01-18 DIAGNOSIS — Z95820 Peripheral vascular angioplasty status with implants and grafts: Secondary | ICD-10-CM | POA: Diagnosis not present

## 2024-01-18 DIAGNOSIS — M109 Gout, unspecified: Secondary | ICD-10-CM

## 2024-01-18 DIAGNOSIS — R570 Cardiogenic shock: Secondary | ICD-10-CM | POA: Diagnosis present

## 2024-01-18 DIAGNOSIS — I10 Essential (primary) hypertension: Secondary | ICD-10-CM | POA: Diagnosis not present

## 2024-01-18 DIAGNOSIS — D631 Anemia in chronic kidney disease: Secondary | ICD-10-CM | POA: Diagnosis present

## 2024-01-18 DIAGNOSIS — G629 Polyneuropathy, unspecified: Secondary | ICD-10-CM | POA: Diagnosis present

## 2024-01-18 DIAGNOSIS — I2489 Other forms of acute ischemic heart disease: Secondary | ICD-10-CM | POA: Diagnosis present

## 2024-01-18 DIAGNOSIS — K7460 Unspecified cirrhosis of liver: Secondary | ICD-10-CM | POA: Diagnosis present

## 2024-01-18 DIAGNOSIS — I130 Hypertensive heart and chronic kidney disease with heart failure and stage 1 through stage 4 chronic kidney disease, or unspecified chronic kidney disease: Secondary | ICD-10-CM | POA: Diagnosis present

## 2024-01-18 DIAGNOSIS — J9611 Chronic respiratory failure with hypoxia: Secondary | ICD-10-CM | POA: Diagnosis present

## 2024-01-18 DIAGNOSIS — N179 Acute kidney failure, unspecified: Secondary | ICD-10-CM | POA: Diagnosis not present

## 2024-01-18 DIAGNOSIS — Z515 Encounter for palliative care: Secondary | ICD-10-CM | POA: Diagnosis not present

## 2024-01-18 LAB — HEPATIC FUNCTION PANEL
ALT: 314 U/L — ABNORMAL HIGH (ref 0–44)
AST: 177 U/L — ABNORMAL HIGH (ref 15–41)
Albumin: 2.9 g/dL — ABNORMAL LOW (ref 3.5–5.0)
Alkaline Phosphatase: 125 U/L (ref 38–126)
Bilirubin, Direct: 0.7 mg/dL — ABNORMAL HIGH (ref 0.0–0.2)
Indirect Bilirubin: 1 mg/dL — ABNORMAL HIGH (ref 0.3–0.9)
Total Bilirubin: 1.7 mg/dL — ABNORMAL HIGH (ref 0.0–1.2)
Total Protein: 6 g/dL — ABNORMAL LOW (ref 6.5–8.1)

## 2024-01-18 LAB — URINALYSIS, ROUTINE W REFLEX MICROSCOPIC
Bilirubin Urine: NEGATIVE
Glucose, UA: 50 mg/dL — AB
Hgb urine dipstick: NEGATIVE
Ketones, ur: NEGATIVE mg/dL
Leukocytes,Ua: NEGATIVE
Nitrite: NEGATIVE
Protein, ur: 100 mg/dL — AB
Specific Gravity, Urine: 1.011 (ref 1.005–1.030)
pH: 5 (ref 5.0–8.0)

## 2024-01-18 LAB — MAGNESIUM: Magnesium: 2.4 mg/dL (ref 1.7–2.4)

## 2024-01-18 LAB — BASIC METABOLIC PANEL WITH GFR
Anion gap: 11 (ref 5–15)
BUN: 111 mg/dL — ABNORMAL HIGH (ref 8–23)
CO2: 28 mmol/L (ref 22–32)
Calcium: 9.1 mg/dL (ref 8.9–10.3)
Chloride: 97 mmol/L — ABNORMAL LOW (ref 98–111)
Creatinine, Ser: 2.84 mg/dL — ABNORMAL HIGH (ref 0.61–1.24)
GFR, Estimated: 22 mL/min — ABNORMAL LOW (ref >60)
Glucose, Bld: 107 mg/dL — ABNORMAL HIGH (ref 70–99)
Potassium: 4.1 mmol/L (ref 3.5–5.1)
Sodium: 136 mmol/L (ref 135–145)

## 2024-01-18 LAB — ECHOCARDIOGRAM COMPLETE
AR max vel: 2.07 cm2
AV Area VTI: 2.17 cm2
AV Area mean vel: 1.99 cm2
AV Mean grad: 1 mmHg
AV Peak grad: 2 mmHg
Ao pk vel: 0.7 m/s
Area-P 1/2: 5.96 cm2
Calc EF: 35.3 %
Est EF: <20
Height: 72 in
MV M vel: 4.93 m/s
MV Peak grad: 97.2 mmHg
MV VTI: 1.24 cm2
S' Lateral: 5.2 cm
Single Plane A2C EF: 37.1 %
Single Plane A4C EF: 37 %
Weight: 2496 [oz_av]

## 2024-01-18 LAB — CBC WITH DIFFERENTIAL/PLATELET
Abs Immature Granulocytes: 0.05 10*3/uL (ref 0.00–0.07)
Basophils Absolute: 0 10*3/uL (ref 0.0–0.1)
Basophils Relative: 0 %
Eosinophils Absolute: 0 10*3/uL (ref 0.0–0.5)
Eosinophils Relative: 0 %
HCT: 25.2 % — ABNORMAL LOW (ref 39.0–52.0)
Hemoglobin: 7.3 g/dL — ABNORMAL LOW (ref 13.0–17.0)
Immature Granulocytes: 1 %
Lymphocytes Relative: 11 %
Lymphs Abs: 1.1 10*3/uL (ref 0.7–4.0)
MCH: 24.9 pg — ABNORMAL LOW (ref 26.0–34.0)
MCHC: 29 g/dL — ABNORMAL LOW (ref 30.0–36.0)
MCV: 86 fL (ref 80.0–100.0)
Monocytes Absolute: 0.9 10*3/uL (ref 0.1–1.0)
Monocytes Relative: 9 %
Neutro Abs: 7.8 10*3/uL — ABNORMAL HIGH (ref 1.7–7.7)
Neutrophils Relative %: 79 %
Platelets: 81 10*3/uL — ABNORMAL LOW (ref 150–400)
RBC: 2.93 MIL/uL — ABNORMAL LOW (ref 4.22–5.81)
RDW: 16.7 % — ABNORMAL HIGH (ref 11.5–15.5)
WBC: 9.8 10*3/uL (ref 4.0–10.5)
nRBC: 0.5 % — ABNORMAL HIGH (ref 0.0–0.2)

## 2024-01-18 LAB — HEPARIN LEVEL (UNFRACTIONATED)
Heparin Unfractionated: <0.1 [IU]/mL — ABNORMAL LOW (ref 0.30–0.70)
Heparin Unfractionated: 0.2 [IU]/mL — ABNORMAL LOW (ref 0.30–0.70)

## 2024-01-18 LAB — GLUCOSE, CAPILLARY: Glucose-Capillary: 167 mg/dL — ABNORMAL HIGH (ref 70–99)

## 2024-01-18 LAB — LACTIC ACID, PLASMA
Lactic Acid, Venous: 1.7 mmol/L (ref 0.5–1.9)
Lactic Acid, Venous: 1.9 mmol/L (ref 0.5–1.9)

## 2024-01-18 LAB — PROTIME-INR
INR: 1.4 — ABNORMAL HIGH (ref 0.8–1.2)
Prothrombin Time: 17.4 s — ABNORMAL HIGH (ref 11.4–15.2)

## 2024-01-18 LAB — TROPONIN I (HIGH SENSITIVITY)
Troponin I (High Sensitivity): 382 ng/L (ref <18)
Troponin I (High Sensitivity): 404 ng/L (ref <18)
Troponin I (High Sensitivity): 389 ng/L (ref <18)
Troponin I (High Sensitivity): 326 ng/L (ref <18)

## 2024-01-18 LAB — TSH: TSH: 2.559 u[IU]/mL (ref 0.350–4.500)

## 2024-01-18 MED ORDER — ALLOPURINOL 300 MG PO TABS
300.0000 mg | ORAL_TABLET | Freq: Every evening | ORAL | Status: DC
  Administered 2024-01-18 – 2024-01-21 (×4): 300 mg via ORAL
  Filled 2024-01-18 (×4): qty 1

## 2024-01-18 MED ORDER — HEPARIN (PORCINE) 25000 UT/250ML-% IV SOLN
1050.0000 [IU]/h | INTRAVENOUS | Status: DC
  Administered 2024-01-18: 900 [IU]/h via INTRAVENOUS
  Administered 2024-01-19: 1050 [IU]/h via INTRAVENOUS
  Filled 2024-01-18 (×2): qty 250

## 2024-01-18 MED ORDER — FUROSEMIDE 10 MG/ML IJ SOLN
80.0000 mg | Freq: Once | INTRAMUSCULAR | Status: DC
Start: 2024-01-18 — End: 2024-01-19
  Filled 2024-01-18: qty 8

## 2024-01-18 MED ORDER — AMIODARONE HCL 200 MG PO TABS
200.0000 mg | ORAL_TABLET | Freq: Every day | ORAL | Status: DC
  Administered 2024-01-18: 200 mg via ORAL
  Filled 2024-01-18: qty 1

## 2024-01-18 MED ORDER — ESCITALOPRAM OXALATE 10 MG PO TABS
10.0000 mg | ORAL_TABLET | Freq: Every evening | ORAL | Status: DC
  Administered 2024-01-18 – 2024-01-21 (×4): 10 mg via ORAL
  Filled 2024-01-18 (×4): qty 1

## 2024-01-18 MED ORDER — FAMOTIDINE 20 MG PO TABS
20.0000 mg | ORAL_TABLET | Freq: Every evening | ORAL | Status: DC
  Administered 2024-01-18 – 2024-01-21 (×4): 20 mg via ORAL
  Filled 2024-01-18 (×4): qty 1

## 2024-01-18 MED ORDER — CLOPIDOGREL BISULFATE 75 MG PO TABS
75.0000 mg | ORAL_TABLET | Freq: Every day | ORAL | Status: DC
  Administered 2024-01-18 – 2024-01-21 (×4): 75 mg via ORAL
  Filled 2024-01-18 (×4): qty 1

## 2024-01-18 MED ORDER — GABAPENTIN 300 MG PO CAPS
600.0000 mg | ORAL_CAPSULE | Freq: Two times a day (BID) | ORAL | Status: DC
  Administered 2024-01-18 – 2024-01-19 (×3): 600 mg via ORAL
  Filled 2024-01-18 (×3): qty 2

## 2024-01-18 MED ORDER — TRAZODONE HCL 50 MG PO TABS
100.0000 mg | ORAL_TABLET | Freq: Every day | ORAL | Status: DC
  Administered 2024-01-18 – 2024-01-21 (×3): 100 mg via ORAL
  Filled 2024-01-18: qty 1
  Filled 2024-01-18 (×3): qty 2

## 2024-01-18 MED ORDER — PERFLUTREN LIPID MICROSPHERE
1.0000 mL | INTRAVENOUS | Status: AC | PRN
  Administered 2024-01-18: 2 mL via INTRAVENOUS

## 2024-01-18 MED ORDER — MIDODRINE HCL 5 MG PO TABS
5.0000 mg | ORAL_TABLET | Freq: Three times a day (TID) | ORAL | Status: DC
  Administered 2024-01-18 – 2024-01-19 (×2): 5 mg via ORAL
  Filled 2024-01-18 (×2): qty 1

## 2024-01-18 MED ORDER — CARVEDILOL 3.125 MG PO TABS: 6.2500 mg | ORAL_TABLET | Freq: Two times a day (BID) | ORAL | Status: DC

## 2024-01-18 MED ORDER — BUSPIRONE HCL 10 MG PO TABS
10.0000 mg | ORAL_TABLET | Freq: Two times a day (BID) | ORAL | Status: DC | PRN
  Administered 2024-01-18: 10 mg via ORAL
  Filled 2024-01-18: qty 1

## 2024-01-18 MED ORDER — NITROGLYCERIN 0.4 MG SL SUBL: 0.4000 mg | SUBLINGUAL_TABLET | SUBLINGUAL | Status: DC | PRN

## 2024-01-18 MED ORDER — IPRATROPIUM-ALBUTEROL 0.5-2.5 (3) MG/3ML IN SOLN
3.0000 mL | Freq: Four times a day (QID) | RESPIRATORY_TRACT | Status: DC
  Administered 2024-01-18 – 2024-01-19 (×5): 3 mL via RESPIRATORY_TRACT
  Filled 2024-01-18 (×5): qty 3

## 2024-01-18 NOTE — Assessment & Plan Note (Signed)
Old records personally reviewed, neurology follow up from 05/2021. Seropositive ocular myasthenia gravis, no significant bulbar, or limb weakness noted.  He is not longer on Mestinon or prednisone, never treated with long term steroids sparing agent.   

## 2024-01-18 NOTE — Progress Notes (Signed)
  Echocardiogram 2D Echocardiogram has been performed.  Hanley Woerner L Coyt Govoni RDCS 01/18/2024, 3:43 PM

## 2024-01-18 NOTE — Progress Notes (Signed)
 Progress Note   Patient: Richard Rivers ZOX:[id number] DOB: [birth year] DOA: 01/17/2024     0 DOS: the patient was seen and examined on 01/18/2024   Brief hospital course: Richard Rivers was admitted to the hospital with the working diagnosis of heart failure exacerbation.  78 yo male with he past medical history of heart failure, atrial fibrillation, coronary artery disease, COPD, and peripheral vascular disease who presented with dyspnea. He reported 2 weeks of worsening dyspnea on exertion and orthopnea. Because severe symptoms EMS was called, he was found hypoxemic with 02 saturation 88% on room air. He was placed on 4 L/min per [place], give albuterol  and was transported to the ED.  On his initial physical examination his blood pressure was 101/62, HR 45 to 74, RR 14 and 02 saturation 95% on supplemental 02 per [place].  Lungs with no rhonchi or wheezing, heart with S1 and S2 present and regular, abdomen with no distention, positive lower extremity edema.   Na 139, K 4.2 CL 97 bicarbonate 25 glucose 112 bun 111 cr 2,80  AST 218 ALT 359 Total bilirubin 1.7  High sensitive troponin 400, 382 and 404  BNP > 4.500  Wbc 9,8 hgb 7.1 plt 86  Urine analysis SG 1,011 protein 100, leukocytes negative and negative hgb   Chest radiograph with hyperinflation, mild cardiomegaly, bilateral hilar vascular congestion and fluid in the right fissure, right elevation hemidiaphragm with possible small right pleural effusion.   EKG 73 bpm, normal axis, left bundle branch block, qtc 451, sinus rhythm with left atrial enlargement, no significant ST segment changes, negative T wave lead II, III, aVF, q wave lead V1 and V2.    Assessment and Plan: * Acute on chronic systolic CHF (congestive heart failure) (HCC) Echocardiogram with reduced LV systolic function EF less than 20%, global hypokinesis, LV with mild dilatated, RV systolic function with moderate reduction, RVSP 47.9 mmHg, LA and RA with moderate dilatation, mild  to moderate MR, moderate TR.   Continue volume overloaded. Systolic blood pressure 100 mmHg range.   Plan to continue diuresis with IV furosemide  80 mg IV If no response may need inotropic support.  Limited pharmacologic therapy due to risk of hypotension.   Congested hepatopathy, liver enzymes continue elevated but trending down.   CAD (coronary artery disease) Continue clopidogrel .  Continue heparin  IV for anticoagulation    Hypertension Continue to hold on antihypertensive agents.  Close follow up blood pressure   AKI (acute kidney injury) (HCC) Renal function with serum cr at 2,84 with K at 4,1 and serum bicarbonate at 28  Na 136   Continue diuresis and follow up renal function and electrolytes.   Atrial flutter (HCC) Continue telemetry monitoring Anticoagulation with IV heparin  for now   PAD (peripheral artery disease) (HCC) Continue clopidogrel    COPD (chronic obstructive pulmonary disease) (HCC) No signs of acute exacerbation   Gout No signs of exacerbation      Subjective: Patient with persistent dyspnea, improved from yesterday but not yet back to baseline, no chest pain, continue to have edema at his lower extremities.   Physical Exam: Vitals:   01/18/24 1400 01/18/24 1440 01/18/24 1528 01/18/24 1649  BP: 96/73 99/65    Pulse: 73 76 78   Resp: (!) 22 17 18    Temp:  97.6 F (36.4 C)    TempSrc:  Oral    SpO2: 99%  96%   Weight:    68.6 kg  Height:    6' (1.829 m)  Neurology awake and alert, deconditioned and ill looking appearing  ENT with mild pallor  Cardiovascular with S1 and S2 present and regular with no gallops or rubs, positive systolic murmur at the apex JVD moderate  Respiratory with rales bilaterally with no wheezing or rhonchi  Abdomen with no distention  Positive lower extremity edema ++   Data Reviewed:    Family Communication: no family at the bedside   Disposition: Status is: Inpatient Remains inpatient appropriate  because: Iv diuresis   Planned Discharge Destination: Home     Author: Albertus Alt, MD 01/18/2024 5:06 PM  For on call review [email or web address].

## 2024-01-18 NOTE — Assessment & Plan Note (Addendum)
 Echocardiogram with reduced LV systolic function EF less than 20%, global hypokinesis, LV with mild dilatated, RV systolic function with moderate reduction, RVSP 47.9 mmHg, LA and RA with moderate dilatation, mild to moderate MR, moderate TR.   Continue volume overloaded. Systolic blood pressure 100 mmHg range.   Plan to continue diuresis with IV furosemide  80 mg IV If no response may need inotropic support.  Limited pharmacologic therapy due to risk of hypotension.   Congested hepatopathy, liver enzymes continue elevated but trending down.

## 2024-01-18 NOTE — ED Notes (Signed)
 Help patient sit up and eat patient has call bell in reach

## 2024-01-18 NOTE — Assessment & Plan Note (Signed)
Continue clopidogrel

## 2024-01-18 NOTE — ED Notes (Signed)
 Pt BP in the 90s systolic. [facility] MD notified. Hold Lasix  dose for 0500 per MD.

## 2024-01-18 NOTE — Assessment & Plan Note (Signed)
 Renal function with serum cr at 2,84 with K at 4,1 and serum bicarbonate at 28  Na 136   Continue diuresis and follow up renal function and electrolytes.

## 2024-01-18 NOTE — Assessment & Plan Note (Addendum)
 Continue clopidogrel .  Continue heparin  IV for anticoagulation
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
## 2024-01-18 NOTE — Assessment & Plan Note (Signed)
 Continue telemetry monitoring Anticoagulation with IV heparin  for now

## 2024-01-18 NOTE — Assessment & Plan Note (Signed)
 No signs of exacerbation.
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
## 2024-01-18 NOTE — Assessment & Plan Note (Signed)
 Continue to hold on antihypertensive agents.  Close follow up blood pressure

## 2024-01-18 NOTE — Hospital Course (Addendum)
 Mr. Hathorne was admitted to the hospital with the working diagnosis of heart failure exacerbation.  78 yo male with he past medical history of heart failure, atrial fibrillation, coronary artery disease, COPD, and peripheral vascular disease who presented with dyspnea. He reported 2 weeks of worsening dyspnea on exertion and orthopnea. Because severe symptoms EMS was called, he was found hypoxemic with 02 saturation 88% on room air. He was placed on 4 L/min per [place], give albuterol  and was transported to the ED.  On his initial physical examination his blood pressure was 101/62, HR 45 to 74, RR 14 and 02 saturation 95% on supplemental 02 per [place].  Lungs with no rhonchi or wheezing, heart with S1 and S2 present and regular, abdomen with no distention, positive lower extremity edema.   Na 139, K 4.2 CL 97 bicarbonate 25 glucose 112 bun 111 cr 2,80  AST 218 ALT 359 Total bilirubin 1.7  High sensitive troponin 400, 382 and 404  BNP > 4.500  Wbc 9,8 hgb 7.1 plt 86  Urine analysis SG 1,011 protein 100, leukocytes negative and negative hgb   Chest radiograph with hyperinflation, mild cardiomegaly, bilateral hilar vascular congestion and fluid in the right fissure, right elevation hemidiaphragm with possible small right pleural effusion.   EKG 73 bpm, normal axis, left bundle branch block, qtc 451, sinus rhythm with left atrial enlargement, no significant ST segment changes, negative T wave lead II, III, aVF, q wave lead V1 and V2.
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
## 2024-01-19 DIAGNOSIS — I5023 Acute on chronic systolic (congestive) heart failure: Secondary | ICD-10-CM | POA: Diagnosis not present

## 2024-01-19 DIAGNOSIS — N179 Acute kidney failure, unspecified: Secondary | ICD-10-CM | POA: Diagnosis not present

## 2024-01-19 DIAGNOSIS — Z7189 Other specified counseling: Secondary | ICD-10-CM | POA: Diagnosis not present

## 2024-01-19 DIAGNOSIS — R570 Cardiogenic shock: Secondary | ICD-10-CM | POA: Diagnosis not present

## 2024-01-19 DIAGNOSIS — S36119A Unspecified injury of liver, initial encounter: Secondary | ICD-10-CM

## 2024-01-19 DIAGNOSIS — I5043 Acute on chronic combined systolic (congestive) and diastolic (congestive) heart failure: Secondary | ICD-10-CM | POA: Diagnosis not present

## 2024-01-19 DIAGNOSIS — I9589 Other hypotension: Secondary | ICD-10-CM

## 2024-01-19 LAB — COMPREHENSIVE METABOLIC PANEL WITH GFR
ALT: 232 U/L — ABNORMAL HIGH (ref 0–44)
ALT: 205 U/L — ABNORMAL HIGH (ref 0–44)
AST: 105 U/L — ABNORMAL HIGH (ref 15–41)
AST: 83 U/L — ABNORMAL HIGH (ref 15–41)
Albumin: 2.7 g/dL — ABNORMAL LOW (ref 3.5–5.0)
Albumin: 2.7 g/dL — ABNORMAL LOW (ref 3.5–5.0)
Alkaline Phosphatase: 124 U/L (ref 38–126)
Alkaline Phosphatase: 117 U/L (ref 38–126)
Anion gap: 8 (ref 5–15)
Anion gap: 10 (ref 5–15)
BUN: 113 mg/dL — ABNORMAL HIGH (ref 8–23)
BUN: 103 mg/dL — ABNORMAL HIGH (ref 8–23)
CO2: 29 mmol/L (ref 22–32)
CO2: 28 mmol/L (ref 22–32)
Calcium: 8.8 mg/dL — ABNORMAL LOW (ref 8.9–10.3)
Calcium: 8.6 mg/dL — ABNORMAL LOW (ref 8.9–10.3)
Chloride: 98 mmol/L (ref 98–111)
Chloride: 95 mmol/L — ABNORMAL LOW (ref 98–111)
Creatinine, Ser: 2.8 mg/dL — ABNORMAL HIGH (ref 0.61–1.24)
Creatinine, Ser: 2.67 mg/dL — ABNORMAL HIGH (ref 0.61–1.24)
GFR, Estimated: 22 mL/min — ABNORMAL LOW (ref >60)
GFR, Estimated: 24 mL/min — ABNORMAL LOW (ref >60)
Glucose, Bld: 103 mg/dL — ABNORMAL HIGH (ref 70–99)
Glucose, Bld: 230 mg/dL — ABNORMAL HIGH (ref 70–99)
Potassium: 3.7 mmol/L (ref 3.5–5.1)
Potassium: 3.4 mmol/L — ABNORMAL LOW (ref 3.5–5.1)
Sodium: 135 mmol/L (ref 135–145)
Sodium: 133 mmol/L — ABNORMAL LOW (ref 135–145)
Total Bilirubin: 1.3 mg/dL — ABNORMAL HIGH (ref 0.0–1.2)
Total Bilirubin: 1.6 mg/dL — ABNORMAL HIGH (ref 0.0–1.2)
Total Protein: 5.5 g/dL — ABNORMAL LOW (ref 6.5–8.1)
Total Protein: 5.7 g/dL — ABNORMAL LOW (ref 6.5–8.1)

## 2024-01-19 LAB — COOXEMETRY PANEL
Carboxyhemoglobin: 1.5 % (ref 0.5–1.5)
Carboxyhemoglobin: 1.9 % — ABNORMAL HIGH (ref 0.5–1.5)
Methemoglobin: <0.7 % (ref 0.0–1.5)
Methemoglobin: 1 % (ref 0.0–1.5)
O2 Saturation: 56.3 %
O2 Saturation: 74.7 %
Total hemoglobin: 7.1 g/dL — ABNORMAL LOW (ref 12.0–16.0)
Total hemoglobin: 8.5 g/dL — ABNORMAL LOW (ref 12.0–16.0)

## 2024-01-19 LAB — CBC
HCT: 23 % — ABNORMAL LOW (ref 39.0–52.0)
HCT: 27 % — ABNORMAL LOW (ref 39.0–52.0)
Hemoglobin: 6.7 g/dL — CL (ref 13.0–17.0)
Hemoglobin: 8 g/dL — ABNORMAL LOW (ref 13.0–17.0)
MCH: 24.5 pg — ABNORMAL LOW (ref 26.0–34.0)
MCH: 25.5 pg — ABNORMAL LOW (ref 26.0–34.0)
MCHC: 29.1 g/dL — ABNORMAL LOW (ref 30.0–36.0)
MCHC: 29.6 g/dL — ABNORMAL LOW (ref 30.0–36.0)
MCV: 83.9 fL (ref 80.0–100.0)
MCV: 86 fL (ref 80.0–100.0)
Platelets: 93 10*3/uL — ABNORMAL LOW (ref 150–400)
Platelets: 98 10*3/uL — ABNORMAL LOW (ref 150–400)
RBC: 2.74 MIL/uL — ABNORMAL LOW (ref 4.22–5.81)
RBC: 3.14 MIL/uL — ABNORMAL LOW (ref 4.22–5.81)
RDW: 16.8 % — ABNORMAL HIGH (ref 11.5–15.5)
RDW: 16.6 % — ABNORMAL HIGH (ref 11.5–15.5)
WBC: 11.9 10*3/uL — ABNORMAL HIGH (ref 4.0–10.5)
WBC: 11.5 10*3/uL — ABNORMAL HIGH (ref 4.0–10.5)
nRBC: 0.3 % — ABNORMAL HIGH (ref 0.0–0.2)
nRBC: 0.4 % — ABNORMAL HIGH (ref 0.0–0.2)

## 2024-01-19 LAB — FERRITIN: Ferritin: 23 ng/mL — ABNORMAL LOW (ref 24–336)

## 2024-01-19 LAB — IRON AND TIBC
Iron: 12 ug/dL — ABNORMAL LOW (ref 45–182)
Saturation Ratios: 3 % — ABNORMAL LOW (ref 17.9–39.5)
TIBC: 389 ug/dL (ref 250–450)
UIBC: 377 ug/dL

## 2024-01-19 LAB — LACTIC ACID, PLASMA
Lactic Acid, Venous: 1.4 mmol/L (ref 0.5–1.9)
Lactic Acid, Venous: 1.2 mmol/L (ref 0.5–1.9)

## 2024-01-19 LAB — RETICULOCYTES
Immature Retic Fract: 36 % — ABNORMAL HIGH (ref 2.3–15.9)
RBC.: 2.7 MIL/uL — ABNORMAL LOW (ref 4.22–5.81)
Retic Count, Absolute: 53.7 10*3/uL (ref 19.0–186.0)
Retic Ct Pct: 2 % (ref 0.4–3.1)

## 2024-01-19 LAB — HEPARIN LEVEL (UNFRACTIONATED)
Heparin Unfractionated: 0.34 [IU]/mL (ref 0.30–0.70)
Heparin Unfractionated: 0.34 [IU]/mL (ref 0.30–0.70)

## 2024-01-19 LAB — PREPARE RBC (CROSSMATCH)

## 2024-01-19 LAB — FOLATE: Folate: 13.4 ng/mL (ref >5.9)

## 2024-01-19 LAB — MAGNESIUM: Magnesium: 2.3 mg/dL (ref 1.7–2.4)

## 2024-01-19 LAB — VITAMIN B12: Vitamin B-12: 1381 pg/mL — ABNORMAL HIGH (ref 180–914)

## 2024-01-19 MED ORDER — FUROSEMIDE 10 MG/ML IJ SOLN
120.0000 mg | Freq: Once | INTRAVENOUS | Status: AC
  Administered 2024-01-19: 120 mg via INTRAVENOUS
  Filled 2024-01-19: qty 12

## 2024-01-19 MED ORDER — BUDESONIDE 0.25 MG/2ML IN SUSP
0.2500 mg | Freq: Two times a day (BID) | RESPIRATORY_TRACT | Status: DC
  Administered 2024-01-19 – 2024-01-21 (×4): 0.25 mg via RESPIRATORY_TRACT
  Filled 2024-01-19 (×6): qty 2

## 2024-01-19 MED ORDER — SODIUM CHLORIDE 0.9% FLUSH
10.0000 mL | Freq: Two times a day (BID) | INTRAVENOUS | Status: DC
  Administered 2024-01-19 – 2024-01-22 (×6): 10 mL

## 2024-01-19 MED ORDER — CHLORHEXIDINE GLUCONATE CLOTH 2 % EX PADS
6.0000 | MEDICATED_PAD | Freq: Every day | CUTANEOUS | Status: DC
  Administered 2024-01-19 – 2024-01-21 (×4): 6 via TOPICAL

## 2024-01-19 MED ORDER — BUDESONIDE 0.25 MG/2ML IN SUSP: 0.2500 mg | Freq: Two times a day (BID) | RESPIRATORY_TRACT | Status: DC

## 2024-01-19 MED ORDER — POTASSIUM CHLORIDE CRYS ER 20 MEQ PO TBCR
40.0000 meq | EXTENDED_RELEASE_TABLET | ORAL | Status: AC
  Administered 2024-01-19 (×2): 40 meq via ORAL
  Filled 2024-01-19 (×2): qty 2

## 2024-01-19 MED ORDER — ENSURE ENLIVE PO LIQD
237.0000 mL | Freq: Two times a day (BID) | ORAL | Status: DC
  Administered 2024-01-20 – 2024-01-21 (×4): 237 mL via ORAL

## 2024-01-19 MED ORDER — NOREPINEPHRINE 4 MG/250ML-% IV SOLN
5.0000 ug/min | INTRAVENOUS | Status: DC
  Administered 2024-01-19 – 2024-01-22 (×6): 5 ug/min via INTRAVENOUS
  Filled 2024-01-19 (×5): qty 250

## 2024-01-19 MED ORDER — FUROSEMIDE 10 MG/ML IJ SOLN
40.0000 mg | Freq: Two times a day (BID) | INTRAMUSCULAR | Status: DC
  Filled 2024-01-19: qty 4

## 2024-01-19 MED ORDER — SODIUM CHLORIDE 0.9% IV SOLUTION: Freq: Once | INTRAVENOUS | Status: AC

## 2024-01-19 MED ORDER — IPRATROPIUM-ALBUTEROL 0.5-2.5 (3) MG/3ML IN SOLN
3.0000 mL | Freq: Two times a day (BID) | RESPIRATORY_TRACT | Status: DC
  Administered 2024-01-19 – 2024-01-20 (×2): 3 mL via RESPIRATORY_TRACT
  Filled 2024-01-19 (×2): qty 3

## 2024-01-19 MED ORDER — MIDODRINE HCL 5 MG PO TABS
10.0000 mg | ORAL_TABLET | Freq: Three times a day (TID) | ORAL | Status: DC
Start: 2024-01-19 — End: 2024-01-19

## 2024-01-19 MED ORDER — NOREPINEPHRINE 4 MG/250ML-% IV SOLN
5.0000 ug/min | INTRAVENOUS | Status: DC
Start: 2024-01-19 — End: 2024-01-19
  Filled 2024-01-19: qty 250

## 2024-01-19 MED ORDER — MIDODRINE HCL 5 MG PO TABS
5.0000 mg | ORAL_TABLET | Freq: Once | ORAL | Status: AC
  Administered 2024-01-19: 5 mg via ORAL
  Filled 2024-01-19: qty 1

## 2024-01-19 MED ORDER — SODIUM CHLORIDE 0.9% FLUSH: 10.0000 mL | INTRAVENOUS | Status: DC | PRN

## 2024-01-19 MED ORDER — GABAPENTIN 300 MG PO CAPS
300.0000 mg | ORAL_CAPSULE | Freq: Two times a day (BID) | ORAL | Status: DC
  Administered 2024-01-19 – 2024-01-21 (×5): 300 mg via ORAL
  Filled 2024-01-19 (×5): qty 1

## 2024-01-19 NOTE — Progress Notes (Signed)
 Peripherally Inserted Central Catheter Placement  The IV Nurse has discussed with the patient and/or persons authorized to consent for the patient, the purpose of this procedure and the potential benefits and risks involved with this procedure.  The benefits include less needle sticks, lab draws from the catheter, and the patient may be discharged home with the catheter. Risks include, but not limited to, infection, bleeding, blood clot (thrombus formation), and puncture of an artery; nerve damage and irregular heartbeat and possibility to perform a PICC exchange if needed/ordered by physician.  Alternatives to this procedure were also discussed.  Bard Power PICC patient education guide, fact sheet on infection prevention and patient information card has been provided to patient /or left at bedside.    PICC Placement Documentation  PICC Double Lumen 01/19/24 Right Basilic 35 cm 0 cm (Active)  Indication for Insertion or Continuance of Line Chronic illness with exacerbations (CF, Sickle Cell, etc.) 01/19/24 0936  Exposed Catheter (cm) 0 cm 01/19/24 0936  Site Assessment Clean, Dry, Intact 01/19/24 0936  Lumen #1 Status Flushed;Saline locked;Blood return noted 01/19/24 0936  Lumen #2 Status Saline locked;Flushed;Blood return noted 01/19/24 0936  Dressing Type Transparent;Securing device 01/19/24 0936  Dressing Status Antimicrobial disc/dressing in place;Clean, Dry, Intact 01/19/24 0936  Line Care Connections checked and tightened 01/19/24 0936  Line Adjustment (NICU/IV Team Only) No 01/19/24 0936  Dressing Intervention Adhesive placed at insertion site (IV team only) 01/19/24 0936  Dressing Change Due 01/26/24 01/19/24 0936       Richard Rivers 01/19/2024, 9:40 AM

## 2024-01-19 NOTE — Progress Notes (Signed)
 [facility] night cross cover note:   I was notified by the patient's RN  of post-void residual bladder scan of 650 cc's this morning, after patient spontaneously voided a total of 150 cc's overnight . I subsequently ordered straight cath x 1 now.      Camelia Cavalier, DO Hospitalist
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
## 2024-01-19 NOTE — Progress Notes (Signed)
 Addendum: Hb now 6.7, has been trending down all year, check anemia panel, transfuse and hold IV heparin  today  Deforest Fast, MD

## 2024-01-19 NOTE — Plan of Care (Signed)
  Problem: Clinical Measurements: Goal: Diagnostic test results will improve Outcome: Progressing Goal: Respiratory complications will improve Outcome: Progressing Goal: Cardiovascular complication will be avoided Outcome: Progressing   Problem: Clinical Measurements: Goal: Respiratory complications will improve Outcome: Progressing   Problem: Clinical Measurements: Goal: Cardiovascular complication will be avoided Outcome: Progressing   Problem: Activity: Goal: Risk for activity intolerance will decrease Outcome: Progressing   Problem: Nutrition: Goal: Adequate nutrition will be maintained Outcome: Progressing

## 2024-01-19 NOTE — Progress Notes (Signed)
 PHARMACY - ANTICOAGULATION CONSULT NOTE  Pharmacy Consult for heparin  Indication: atrial fibrillation  Labs: Recent Labs    01/17/24 2124 01/17/24 2125 01/17/24 2125 01/17/24 2220 01/17/24 2313 01/18/24 0354 01/18/24 0720 01/18/24 0835 01/18/24 0913 01/18/24 1839 01/19/24 0426  HGB  --  9.5*   < > 7.1*  --  7.3*  --   --   --   --   --   HCT  --  28.0*  --  24.6*  --  25.2*  --   --   --   --   --   PLT  --   --   --  86*  --  81*  --   --   --   --   --   LABPROT  --   --   --   --   --  17.4*  --   --   --   --   --   INR  --   --   --   --   --  1.4*  --   --   --   --   --   HEPARINUNFRC  --   --   --   --   --   --   --  <0.10*  --  0.20* 0.34  CREATININE 2.80*  --   --   --   --  2.84*  --   --   --   --   --   TROPONINIHS 400*  --   --   --    < > 404* 389*  --  326*  --   --    < > = values in this interval not displayed.   Assessment/Plan:  78yo male therapeutic on heparin  after rate change. Will continue infusion at current rate of 1050 units/hr and confirm stable with additional level.  Lonnie Roberts, PharmD, BCPS 01/19/2024 6:10 AM
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
## 2024-01-19 NOTE — Progress Notes (Addendum)
 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  - 5th hospitalization this year - In the ED he was hypoxic, creatinine 2.8, BNP> 4500, troponin 400, 382, chest x-ray with hyperinflation cardiomegaly pulmonary vascular congestion and small pleural effusion, cards consulting  Subjective: -Feels a little better from yesterday but still poorly overall  Assessment and Plan:  Acute on chronic systolic and diastolic CHF Hypotension -Echo now with EF less than 20%, moderately reduced RV moderate MR, moderate TR -PICC line placed this morning, Co. ox pending -Poor candidate for advanced therapies -Monitor for low output, repeat lactic acid this afternoon -Discussed poor prognosis with patient at length again this morning, after much discussion he was agreeable to DNR and natural death in the setting of a cardiac arrest Continue midodrine, GDMT on hold at this time - Will consult palliative care  CAD history of PCI Continue clopidogrel .  Continue heparin  IV for anticoagulation  - Statin on hold  AKI (acute kidney injury) (HCC) -Baseline creatinine around 1.5, now 2.8 this admission, suspect cardiorenal etiology - Avoid hypotension, may need inotropic support, await Co. ox  Persistent Afib/Atrial flutter (HCC) Beta-blocker on hold Anticoagulation with IV heparin  for now   PAD (peripheral artery disease) (HCC) Continue clopidogrel    Advanced COPD Chronic respiratory failure on 3 L home O2 -Previously smoked 4 packs/day, now down to 1 pack/day - Prognosis is very poor - Continue DuoNeb, add Pulmicort  Gout No signs of exacerbation  Acute on  chronic anemia Labs pending this morning, add anemia panel to a.m. labs   DVT prophylaxis: IV heparin  Code Status: Discussed CODE STATUS at length he is amenable to DNR now Family Communication: None present, Disposition Plan: TBD  Consultants: Cards   Objective: Vitals:   01/19/24 0900 01/19/24 1000 01/19/24 1101 01/19/24 1110  BP: (!) 105/51 (!) 96/56 (!) 92/54 (!) 92/54  Pulse:    77  Resp:    19  Temp:    98.3 F (36.8 C)  TempSrc:    Oral  SpO2:    100%  Weight:      Height:        Intake/Output Summary (Last 24 hours) at 01/19/2024 1157 Last data filed at 01/19/2024 1009 Gross per 24 hour  Intake 580.36 ml  Output 150 ml  Net 430.36 ml   Filed Weights   01/17/24 2050 01/18/24 1649 01/19/24 0000  Weight: 70.8 kg 68.6 kg 68.1 kg    Examination:  General exam: Frail cachectic elderly male appears much older than stated age, AO x 3 HEENT: Positive JVD Respiratory system: Poor air movement, scattered rhonchi Cardiovascular system: S1 & S2 heard, RRR.  Abd: nondistended, soft and nontender.Normal bowel sounds heard. Extremities: 1+ edema Skin: No rashes Psychiatry: Flat affect   Data Reviewed:   CBC: Recent Labs  Lab 01/17/24 2125 01/17/24 2220 01/18/24 0354  WBC  --  9.8 9.8  NEUTROABS  --  7.6 7.8*  HGB 9.5* 7.1* 7.3*  HCT 28.0* 24.6* 25.2*  MCV  --  85.7 86.0  PLT  --  86* 81*   Basic Metabolic Panel: Recent Labs  Lab 01/17/24 2124 01/17/24 2125 01/18/24 0354 01/19/24  0426  NA 139 137 136 135  K 4.2 4.0 4.1 3.7  CL 97*  --  97* 98  CO2 25  --  28 29  GLUCOSE 112*  --  107* 103*  BUN 111*  --  111* 113*  CREATININE 2.80*  --  2.84* 2.80*  CALCIUM  9.6  --  9.1 8.8*  MG  --   --  2.4 2.3   GFR: Estimated Creatinine Clearance: 20.6 mL/min (A) (by C-G formula based on SCr of 2.8 mg/dL (H)). Liver Function Tests: Recent Labs  Lab 01/17/24 2124 01/18/24 0354 01/19/24 0426  AST 218* 177* 105*  ALT 359* 314* 232*  ALKPHOS 141* 125  124  BILITOT 1.7* 1.7* 1.3*  PROT 6.4* 6.0* 5.5*  ALBUMIN 3.1* 2.9* 2.7*   No results for input(s): "LIPASE", "AMYLASE" in the last 168 hours. No results for input(s): "AMMONIA" in the last 168 hours. Coagulation Profile: Recent Labs  Lab 01/18/24 0354  INR 1.4*   Cardiac Enzymes: No results for input(s): "CKTOTAL", "CKMB", "CKMBINDEX", "TROPONINI" in the last 168 hours. BNP (last 3 results) No results for input(s): "PROBNP" in the last 8760 hours. HbA1C: No results for input(s): "HGBA1C" in the last 72 hours. CBG: Recent Labs  Lab 01/18/24 1651  GLUCAP 167*   Lipid Profile: No results for input(s): "CHOL", "HDL", "LDLCALC", "TRIG", "CHOLHDL", "LDLDIRECT" in the last 72 hours. Thyroid Function Tests: Recent Labs    01/18/24 0351  TSH 2.559   Anemia Panel: No results for input(s): "VITAMINB12", "FOLATE", "FERRITIN", "TIBC", "IRON", "RETICCTPCT" in the last 72 hours. Urine analysis:    Component Value Date/Time   COLORURINE YELLOW 01/18/2024 0520   APPEARANCEUR CLEAR 01/18/2024 0520   LABSPEC 1.011 01/18/2024 0520   PHURINE 5.0 01/18/2024 0520   GLUCOSEU 50 (A) 01/18/2024 0520   HGBUR NEGATIVE 01/18/2024 0520   BILIRUBINUR NEGATIVE 01/18/2024 0520   KETONESUR NEGATIVE 01/18/2024 0520   PROTEINUR 100 (A) 01/18/2024 0520   UROBILINOGEN 1.0 10/05/2009 1342   NITRITE NEGATIVE 01/18/2024 0520   LEUKOCYTESUR NEGATIVE 01/18/2024 0520   Sepsis Labs: @LABRCNTIP (procalcitonin:4,lacticidven:4)  )No results found for this or any previous visit (from the past 240 hours).   Radiology Studies: US  EKG SITE RITE Result Date: 01/18/2024 If Site Rite image not attached, placement could not be confirmed due to current cardiac rhythm.  ECHOCARDIOGRAM COMPLETE Result Date: 01/18/2024    ECHOCARDIOGRAM REPORT   Patient Name:   PARISH DUBOSE Date of Exam: 01/18/2024 Medical Rec #:  [id number]        Height:       72.0 in Accession #:    [id number]       Weight:       156.0 lb Date  of Birth:  [birth year]        BSA:          1.917 m Patient Age:    36 years         BP:           96/73 mmHg Patient Gender: M                HR:           74 bpm. Exam Location:  Inpatient Procedure: 2D Echo, Cardiac Doppler, Color Doppler and Intracardiac            Opacification Agent (Both Spectral and Color Flow Doppler were            utilized during procedure). Indications:  CHF  History:        Patient has prior history of Echocardiogram examinations, most                 recent 10/17/2023. CHF, CAD, PAD and COPD, Arrythmias:Atrial                 Flutter; Risk Factors:Hypertension and Dyslipidemia. NSTEMI.  Sonographer:    Juanita Shaw Referring Phys: [id number] TAYLOR A PARCELLS IMPRESSIONS  1. Left ventricular thrombus was not seen (Definity  contrast was used). Left ventricular ejection fraction, by estimation, is <20%. The left ventricle has severely decreased function. The left ventricle demonstrates global hypokinesis. The left ventricular internal cavity size was mildly dilated. Left ventricular diastolic parameters are consistent with Grade III diastolic dysfunction (restrictive). Elevated left atrial pressure.  2. Right ventricular systolic function is moderately reduced. The right ventricular size is normal. There is moderately elevated pulmonary artery systolic pressure. The estimated right ventricular systolic pressure is 47.9 mmHg.  3. Left atrial size was moderately dilated.  4. Right atrial size was moderately dilated.  5. The mitral valve is normal in structure. Mild to moderate mitral valve regurgitation. No evidence of mitral stenosis.  6. Tricuspid valve regurgitation is moderate.  7. The aortic valve is tricuspid. Aortic valve regurgitation is not visualized. No aortic stenosis is present.  8. The inferior vena cava is dilated in size with <50% respiratory variability, suggesting right atrial pressure of 15 mmHg. Comparison(s): Prior images reviewed side by side. The left ventricular  systolic function is significantly worse. The left ventricular diastolic function is significantly worse. FINDINGS  Left Ventricle: Left ventricular thrombus was not seen (Definity  contrast was used). Left ventricular ejection fraction, by estimation, is <20%. The left ventricle has severely decreased function. The left ventricle demonstrates global hypokinesis. The left ventricular internal cavity size was mildly dilated. There is no left ventricular hypertrophy. Abnormal (paradoxical) septal motion, consistent with left bundle branch block. Left ventricular diastolic parameters are consistent with Grade III diastolic dysfunction (restrictive). Elevated left atrial pressure. Right Ventricle: The right ventricular size is normal. No increase in right ventricular wall thickness. Right ventricular systolic function is moderately reduced. There is moderately elevated pulmonary artery systolic pressure. The tricuspid regurgitant velocity is 2.87 m/s, and with an assumed right atrial pressure of 15 mmHg, the estimated right ventricular systolic pressure is 47.9 mmHg. Left Atrium: Left atrial size was moderately dilated. Right Atrium: Right atrial size was moderately dilated. Pericardium: There is no evidence of pericardial effusion. Mitral Valve: The mitral valve is normal in structure. Mild to moderate mitral valve regurgitation, with centrally-directed jet. No evidence of mitral valve stenosis. MV peak gradient, 2.9 mmHg. The mean mitral valve gradient is 1.0 mmHg. Tricuspid Valve: The tricuspid valve is normal in structure. Tricuspid valve regurgitation is moderate. Aortic Valve: The aortic valve is tricuspid. Aortic valve regurgitation is not visualized. No aortic stenosis is present. Aortic valve mean gradient measures 1.0 mmHg. Aortic valve peak gradient measures 2.0 mmHg. Aortic valve area, by VTI measures 2.17 cm. Pulmonic Valve: The pulmonic valve was grossly normal. Pulmonic valve regurgitation is trivial. No  evidence of pulmonic stenosis. Aorta: The aortic root and ascending aorta are structurally normal, with no evidence of dilitation. Venous: The inferior vena cava is dilated in size with less than 50% respiratory variability, suggesting right atrial pressure of 15 mmHg. IAS/Shunts: No atrial level shunt detected by color flow Doppler.  LEFT VENTRICLE PLAX 2D LVIDd:  5.70 cm      Diastology LVIDs:         5.20 cm      LV e' lateral:   6.56 cm/s LV PW:         0.90 cm      LV E/e' lateral: 10.7 LV IVS:        0.90 cm LVOT diam:     2.00 cm LV SV:         23 LV SV Index:   12 LVOT Area:     3.14 cm  LV Volumes (MOD) LV vol d, MOD A2C: 153.0 ml LV vol d, MOD A4C: 230.0 ml LV vol s, MOD A2C: 96.2 ml LV vol s, MOD A4C: 145.0 ml LV SV MOD A2C:     56.8 ml LV SV MOD A4C:     230.0 ml LV SV MOD BP:      68.0 ml RIGHT VENTRICLE            IVC RV Basal diam:  3.30 cm    IVC diam: 1.90 cm RV Mid diam:    2.70 cm RV S prime:     7.00 cm/s TAPSE (M-mode): 1.5 cm LEFT ATRIUM             Index        RIGHT ATRIUM           Index LA diam:        4.50 cm 2.35 cm/m   RA Area:     12.20 cm LA Vol (A2C):   29.5 ml 15.39 ml/m  RA Volume:   29.10 ml  15.18 ml/m LA Vol (A4C):   46.0 ml 24.00 ml/m LA Biplane Vol: 38.7 ml 20.19 ml/m  AORTIC VALVE                    PULMONIC VALVE AV Area (Vmax):    2.07 cm     PV Vmax:          0.52 m/s AV Area (Vmean):   1.99 cm     PV Peak grad:     1.1 mmHg AV Area (VTI):     2.17 cm     PR End Diast Vel: 2.50 msec AV Vmax:           70.20 cm/s AV Vmean:          46.300 cm/s AV VTI:            0.107 m AV Peak Grad:      2.0 mmHg AV Mean Grad:      1.0 mmHg LVOT Vmax:         46.30 cm/s LVOT Vmean:        29.400 cm/s LVOT VTI:          0.074 m LVOT/AV VTI ratio: 0.69  AORTA Ao Root diam: 3.50 cm Ao Asc diam:  3.60 cm MITRAL VALVE               TRICUSPID VALVE MV Area (PHT): 5.96 cm    TR Peak grad:   32.9 mmHg MV Area VTI:   1.24 cm    TR Vmax:        287.00 cm/s MV Peak grad:  2.9 mmHg  MV Mean grad:  1.0 mmHg    SHUNTS MV Vmax:       0.85 m/s    Systemic VTI:  0.07 m MV Vmean:      46.4  cm/s   Systemic Diam: 2.00 cm MV Decel Time: 127 msec MR Peak grad: 97.2 mmHg MR Vmax:      493.00 cm/s MV E velocity: 70.20 cm/s MV A velocity: 36.70 cm/s MV E/A ratio:  1.91 Mihai Croitoru MD Electronically signed by Luana Rumple MD Signature Date/Time: 01/18/2024/4:54:59 PM    Final    US  RENAL Result Date: 01/18/2024 CLINICAL DATA:  Acute renal failure. EXAM: RENAL / URINARY TRACT ULTRASOUND COMPLETE COMPARISON:  None Available. FINDINGS: Right Kidney: Renal measurements: 11.6 x 5.9 x 5.4 cm = volume: 195.4 mL. There are 2 cysts within the upper pole of right kidney. The largest cyst is in the upper pole containing a thin internal area of septation which measures 3 mm in thickness. No hydronephrosis. Increased parenchymal echogenicity. Left Kidney: Renal measurements: 10.9 x 5.9 x 5.3 cm = volume: 177.2 the mL. Increased parenchymal echogenicity. No hydronephrosis. Simple appearing cyst within the inferior pole of the left kidney measure up to 4.3 cm. Bladder: Bladder wall appears thickened measuring 6 mm. No focal bladder abnormality. Other: None. IMPRESSION: 1. No acute findings. No hydronephrosis. 2. Increased renal parenchymal echogenicity compatible with medical renal disease. 3. Bilateral renal cysts. Mildly complicated cyst within the upper pole of right kidney contains a septation measuring 3 mm in thickness. This does not require emergent attention. Consider follow-up imaging in 3 months with renal protocol MRI or CT without and with contrast material. 4. Bladder wall appears thickened measuring 6 mm. Correlate for any clinical signs or symptoms of cystitis. Electronically Signed   By: Kimberley Penman M.D.   On: 01/18/2024 06:29   US  Abdomen Limited RUQ (LIVER/GB) Result Date: 01/18/2024 CLINICAL DATA:  Elevated LFTs. EXAM: ULTRASOUND ABDOMEN LIMITED RIGHT UPPER QUADRANT COMPARISON:  None  Available. FINDINGS: Gallbladder: Gallbladder wall measures 5.5 mm in thickness. No gallstones, sludge or pericholecystic fluid. Negative sonographic Murphy sign. Common bile duct: Diameter: 6.9 mm Liver: Diffuse increased parenchymal echogenicity. Contour of the liver appears nodular. No focal liver abnormality. Portal vein is patent on color Doppler imaging with normal direction of blood flow towards the liver. Other: Right upper quadrant ascites. Anechoic cyst off the upper pole of right kidney measures 2.6 x 2.2 x 2.3 cm. IMPRESSION: 1. Diffuse increased echogenicity of the hepatic parenchyma is a nonspecific indicator of hepatocellular dysfunction, most commonly steatosis. 2. Nodular contour of the liver compatible with cirrhosis. 3. Right upper quadrant ascites. 4. Gallbladder wall thickening is nonspecific and may be related to underlying liver disease. Electronically Signed   By: Kimberley Penman M.D.   On: 01/18/2024 06:22   DG Chest Portable 1 View Result Date: 01/17/2024 CLINICAL DATA:  Cough shortness of breath EXAM: PORTABLE CHEST 1 VIEW COMPARISON:  11/24/2023 FINDINGS: Cardiomegaly with vascular congestion and probable small right effusion. Aortic atherosclerosis. No pneumothorax IMPRESSION: Cardiomegaly with vascular congestion and probable small right effusion. Electronically Signed   By: Esmeralda Hedge M.D.   On: 01/17/2024 22:12     Scheduled Meds:  allopurinol   300 mg Oral QPM   Chlorhexidine Gluconate Cloth  6 each Topical Daily   clopidogrel   75 mg Oral Daily   escitalopram   10 mg Oral QPM   famotidine   20 mg Oral QPM   feeding supplement  237 mL Oral BID BM   furosemide   40 mg Intravenous BID   gabapentin   600 mg Oral BID   ipratropium-albuterol   3 mL Inhalation BID   midodrine  10 mg Oral TID WC   sodium  chloride flush  10-40 mL Intracatheter Q12H   traZODone   100 mg Oral QHS   Continuous Infusions:  heparin  1,050 Units/hr (01/19/24 1055)     LOS: 1 day    Time spent:     Deforest Fast, MD [facility]   01/19/2024, 11:57 AM

## 2024-01-19 NOTE — Progress Notes (Signed)
Heart Failure Navigator Progress Note  Assessed for Heart & Vascular TOC clinic readiness.  Advanced heart failure team has been consulted. Will sign off.   Sharen Hones, PharmD, BCPS Heart Failure Stewardship Pharmacist Phone [phone]

## 2024-01-19 NOTE — TOC Initial Note (Signed)
 Transition of Care [facility]) - Initial/Assessment Note    Patient Details  Name: Richard Rivers MRN: [id number] Date of Birth: 1946-03-17  Transition of Care [facility]) CM/SW Contact:    Ernst Heap Phone Number: [phone] 01/19/2024, 1:51 PM  Clinical Narrative:    12:29 PM- HF CSW attempted to meet with patient at bedside. Patient and family were meeting with HF medical team. CSW will follow up at a more appropriate time.   TOC will continue following.                      Patient Goals and CMS Choice            Expected Discharge Plan and Services                                              Prior Living Arrangements/Services                       Activities of Daily Living   ADL Screening (condition at time of admission) Independently performs ADLs?: No Does the patient have a NEW difficulty with bathing/dressing/toileting/self-feeding that is expected to last >3 days?: Yes (Initiates electronic notice to provider for possible OT consult) Does the patient have a NEW difficulty with getting in/out of bed, walking, or climbing stairs that is expected to last >3 days?: Yes (Initiates electronic notice to provider for possible PT consult) Does the patient have a NEW difficulty with communication that is expected to last >3 days?: No Is the patient deaf or have difficulty hearing?: No Does the patient have difficulty seeing, even when wearing glasses/contacts?: Yes Does the patient have difficulty concentrating, remembering, or making decisions?: Yes  Permission Sought/Granted                  Emotional Assessment              Admission diagnosis:  Acute CHF (HCC) [I50.9] Acute systolic CHF (congestive heart failure) (HCC) [I50.21] Acute HFrEF (heart failure with reduced ejection fraction) (HCC) [I50.21] Patient Active Problem List   Diagnosis Date Noted   Elevated LFTs 01/18/2024   Acute CHF (HCC) 01/18/2024   Hepatic  injury, initial encounter 01/18/2024   Hypotension 01/18/2024   Goals of care, counseling/discussion 01/18/2024   Acute on chronic systolic CHF (congestive heart failure) (HCC) 01/17/2024   AKI (acute kidney injury) (HCC) 01/17/2024   Acute exacerbation of CHF (congestive heart failure) (HCC) 11/24/2023   Acute on chronic heart failure with reduced ejection fraction (HFrEF, <= 40%) (HCC) 11/24/2023   Carotid stenosis 10/23/2023   Protein-calorie malnutrition, severe 10/19/2023   Multifocal pneumonia 10/16/2023   Atrial flutter (HCC) 10/16/2023   COPD (chronic obstructive pulmonary disease) (HCC) 10/16/2023   Marijuana use 10/16/2023   Prolonged QT interval 10/16/2023   Hyperkalemia 10/16/2023   Malnutrition of moderate degree 09/14/2023   NSTEMI (non-ST elevated myocardial infarction) (HCC) 09/12/2023   HFrEF (heart failure with reduced ejection fraction) (HCC) 09/03/2023   Pulmonary hypertension (HCC) 09/03/2023   PAD (peripheral artery disease) (HCC) 09/02/2023   Neuropathy 09/02/2023   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, level unspecified, initial encounter (HCC) 09/02/2023   COPD with acute exacerbation (HCC) 09/02/2023   Depression with anxiety 09/01/2023   Pleural effusion 09/01/2023   Myocardial injury 09/01/2023   Hypertension  HLD (hyperlipidemia)    CAD (coronary artery disease)    Gout    Special screening for malignant neoplasms, colon    Benign neoplasm of appendix    Benign neoplasm of sigmoid colon    Benign neoplasm of ascending colon    Benign neoplasm of transverse colon    Fracture of neck of femur (HCC) 02/01/2015   PCP:  [facility]:   [id number] [place], [place] - [address] AT [facility] [address] [facility] [address] [place] [address] Phone: [phone] Fax: [phone]     Social Drivers of Health (SDOH) Social History: SDOH Screenings   Food Insecurity:  No Food Insecurity (01/19/2024)  Housing: Low Risk  (01/19/2024)  Transportation Needs: No Transportation Needs (01/19/2024)  Utilities: Not At Risk (01/19/2024)  Alcohol Screen: Low Risk  (09/13/2023)  Financial Resource Strain: Low Risk  (09/13/2023)  Social Connections: Socially Isolated (01/19/2024)  Tobacco Use: High Risk (01/18/2024)   SDOH Interventions:     Readmission Risk Interventions    10/22/2023   11:51 AM  Readmission Risk Prevention Plan  Transportation Screening Complete  Medication Review (RN Care Manager) Complete  HRI or Home Care Consult Complete  SW Recovery Care/Counseling Consult Complete  Palliative Care Screening Not Applicable  Skilled Nursing Facility Not Applicable

## 2024-01-19 NOTE — Progress Notes (Signed)
   01/19/24 1538  Spiritual Encounters  Type of Visit Initial  Care provided to: Pt and family  Reason for visit Advance directives  Spiritual Framework  Community/Connection Family  Patient Stress Factors Exhausted;Lack of knowledge;Major life changes  Family Stress Factors Family relationships   Chaplain arrive to check on the  PT. During this visit, AD documents were provided & successfully left with the PT's family member for review.  Family express significant concerns regarding the PT's care at home, stating that the assigned caregiver has not been attending to the PT's needs reporting that the Pt has not eaten in 5 days and have not received proper hygiene.  Chaplain provided emotional support to family and acknowledged their concerns. A follow-up w/ fam will be scheduled to ensure continued support or ethical concerns related to the PT's being and care.

## 2024-01-19 NOTE — Progress Notes (Signed)
 Report called to [facility] and given to Ryland, RN

## 2024-01-19 NOTE — Progress Notes (Signed)
 Rounding Note    Patient Name: Richard Rivers Date of Encounter: 01/19/2024  [facility] Cardiologist: Eilleen Grates, MD   Subjective   No acute events overnight. Continues to have shortness of breath and cough. Feels generally fatigued.  Inpatient Medications    Scheduled Meds:  allopurinol   300 mg Oral QPM   Chlorhexidine Gluconate Cloth  6 each Topical Daily   clopidogrel   75 mg Oral Daily   escitalopram   10 mg Oral QPM   famotidine   20 mg Oral QPM   feeding supplement  237 mL Oral BID BM   furosemide   40 mg Intravenous BID   gabapentin   600 mg Oral BID   ipratropium-albuterol   3 mL Inhalation BID   midodrine  10 mg Oral TID WC   sodium chloride  flush  10-40 mL Intracatheter Q12H   traZODone   100 mg Oral QHS   Continuous Infusions:  heparin  1,050 Units/hr (01/19/24 1055)   PRN Meds: busPIRone , sodium chloride  flush   Vital Signs    Vitals:   01/19/24 0830 01/19/24 0900 01/19/24 1000 01/19/24 1101  BP:  (!) 105/51 (!) 96/56 (!) 92/54  Pulse: 81     Resp: 18     Temp:      TempSrc:      SpO2: 95%     Weight:      Height:        Intake/Output Summary (Last 24 hours) at 01/19/2024 1108 Last data filed at 01/19/2024 1009 Gross per 24 hour  Intake 580.36 ml  Output 150 ml  Net 430.36 ml      01/19/2024   12:00 AM 01/18/2024    4:49 PM 01/17/2024    8:50 PM  Last 3 Weights  Weight (lbs) 150 lb 2.1 oz 151 lb 3.8 oz 156 lb  Weight (kg) 68.1 kg 68.6 kg 70.761 kg      Telemetry    SR - Personally Reviewed  Physical Exam   GEN: Thin/cachectic, frail, chronically ill appearing Neck: JVD to upper neck at 30 degrees Cardiac: RRR, no murmurs, rubs, or gallops.  Respiratory: Coarse diffusely and diminished at bases GI: Soft, nontender, non-distended  MS: bilateral edema both pitting and firm Neuro:  Nonfocal  Psych: Normal affect   New pertinent results (labs, ECG, imaging, cardiac studies)    Echo from 01/18/24 personally  reviewed  Assessment & Plan    Acute on chronic systolic and diastolic heart failure, concern for cardiogenic shock Hypotension Acute kidney injury Acute hepatic injury -while lactate is normal, he has AKI and elevated liver enzymes most consistent with congestion/cardiorenal syndrome. Liver enzymes now trending down -EF is now <20%, with restrictive filling, PA pressure 48 mmHg, RAP 15 mmHg. Worse than prior echo -PICC placed this AM for coox/blood draws and access if vasoactive medications needed -updated: coox returned at 56, low. With lasix  and increasing midodrine, will recheck this afternoon. I will discuss with advanced heart failure team as well -ordered for IV lasix  40 mg BID, will monitor urine output and blood pressures with this -started midodrine yesterday with slight improvement in blood pressure. Will increase dose today -if he cannot tolerate diuresis despite midodrine, would need to transfer to ICU for levophed +/- inotrope -holding carvedilol  2/2 hypotension -holding jardiance  2/2 AKI   Paroxysmal atrial fibrillation -Holding amiodarone  2/2 liver injury -on xarelto  at home, now on heparin  drip   CAD with prior PCI, complex disease -Holding atorvastatin  2/2 liver injury -hsTn elevated but flat, consistent with  demand ischemia -continue plavix  -on heparin  as above   Goals of care: He is very frail, cachectic. I am concerned we have progressed to end stage heart failure. Given his clinical status, I do not think he is likely a candidate for long term advanced heart failure therapies. He is at very high risk of decompensation and may progress into full cardiogenic shock. He does not appear to have any reserve. We discussed earlier today and he said he would want ICU, intubation, CPR if indicated. Based on his clinical trajectory here, would consider palliative care consultation if he is amenable. Appears he may have spoken with Dr. Drexel Gentles after I spoke to him as he is now  limited DNR, will defer to that discussion.  High risk of decompensation, high complexity medical decision making.    Signed, Sheryle Donning, MD  01/19/2024, 11:08 AM

## 2024-01-20 DIAGNOSIS — Z515 Encounter for palliative care: Secondary | ICD-10-CM

## 2024-01-20 DIAGNOSIS — I5043 Acute on chronic combined systolic (congestive) and diastolic (congestive) heart failure: Secondary | ICD-10-CM | POA: Diagnosis not present

## 2024-01-20 DIAGNOSIS — N179 Acute kidney failure, unspecified: Secondary | ICD-10-CM | POA: Diagnosis not present

## 2024-01-20 DIAGNOSIS — I5023 Acute on chronic systolic (congestive) heart failure: Secondary | ICD-10-CM | POA: Diagnosis not present

## 2024-01-20 DIAGNOSIS — Z7189 Other specified counseling: Secondary | ICD-10-CM | POA: Diagnosis not present

## 2024-01-20 DIAGNOSIS — R570 Cardiogenic shock: Secondary | ICD-10-CM | POA: Diagnosis not present

## 2024-01-20 DIAGNOSIS — S36119A Unspecified injury of liver, initial encounter: Secondary | ICD-10-CM | POA: Diagnosis not present

## 2024-01-20 LAB — BASIC METABOLIC PANEL WITH GFR
Anion gap: 8 (ref 5–15)
Anion gap: 9 (ref 5–15)
BUN: 104 mg/dL — ABNORMAL HIGH (ref 8–23)
BUN: 103 mg/dL — ABNORMAL HIGH (ref 8–23)
CO2: 28 mmol/L (ref 22–32)
CO2: 29 mmol/L (ref 22–32)
Calcium: 8.3 mg/dL — ABNORMAL LOW (ref 8.9–10.3)
Calcium: 8.5 mg/dL — ABNORMAL LOW (ref 8.9–10.3)
Chloride: 96 mmol/L — ABNORMAL LOW (ref 98–111)
Chloride: 96 mmol/L — ABNORMAL LOW (ref 98–111)
Creatinine, Ser: 2.64 mg/dL — ABNORMAL HIGH (ref 0.61–1.24)
Creatinine, Ser: 2.32 mg/dL — ABNORMAL HIGH (ref 0.61–1.24)
GFR, Estimated: 24 mL/min — ABNORMAL LOW (ref >60)
GFR, Estimated: 28 mL/min — ABNORMAL LOW (ref >60)
Glucose, Bld: 286 mg/dL — ABNORMAL HIGH (ref 70–99)
Glucose, Bld: 191 mg/dL — ABNORMAL HIGH (ref 70–99)
Potassium: 4.1 mmol/L (ref 3.5–5.1)
Potassium: 4.2 mmol/L (ref 3.5–5.1)
Sodium: 132 mmol/L — ABNORMAL LOW (ref 135–145)
Sodium: 134 mmol/L — ABNORMAL LOW (ref 135–145)

## 2024-01-20 LAB — GLUCOSE, CAPILLARY
Glucose-Capillary: 182 mg/dL — ABNORMAL HIGH (ref 70–99)
Glucose-Capillary: 138 mg/dL — ABNORMAL HIGH (ref 70–99)

## 2024-01-20 LAB — COOXEMETRY PANEL
Carboxyhemoglobin: 1.1 % (ref 0.5–1.5)
Methemoglobin: <0.7 % (ref 0.0–1.5)
O2 Saturation: 62.8 %
Total hemoglobin: 8.6 g/dL — ABNORMAL LOW (ref 12.0–16.0)

## 2024-01-20 LAB — BPAM RBC
Blood Product Expiration Date: 202506202359
ISSUE DATE / TIME: 202505211256
Unit Type and Rh: 6200

## 2024-01-20 LAB — CBC
HCT: 27.8 % — ABNORMAL LOW (ref 39.0–52.0)
Hemoglobin: 8.2 g/dL — ABNORMAL LOW (ref 13.0–17.0)
MCH: 25.4 pg — ABNORMAL LOW (ref 26.0–34.0)
MCHC: 29.5 g/dL — ABNORMAL LOW (ref 30.0–36.0)
MCV: 86.1 fL (ref 80.0–100.0)
Platelets: 91 10*3/uL — ABNORMAL LOW (ref 150–400)
RBC: 3.23 MIL/uL — ABNORMAL LOW (ref 4.22–5.81)
RDW: 16.8 % — ABNORMAL HIGH (ref 11.5–15.5)
WBC: 12.6 10*3/uL — ABNORMAL HIGH (ref 4.0–10.5)
nRBC: 0.3 % — ABNORMAL HIGH (ref 0.0–0.2)

## 2024-01-20 LAB — TYPE AND SCREEN
ABO/RH(D): A POS
Antibody Screen: NEGATIVE
Unit division: 0

## 2024-01-20 LAB — MRSA NEXT GEN BY PCR, NASAL: MRSA by PCR Next Gen: NOT DETECTED

## 2024-01-20 MED ORDER — IPRATROPIUM-ALBUTEROL 0.5-2.5 (3) MG/3ML IN SOLN: 3.0000 mL | RESPIRATORY_TRACT | Status: DC | PRN

## 2024-01-20 MED ORDER — SODIUM CHLORIDE 0.9 % IV SOLN
500.0000 mg | INTRAVENOUS | Status: AC
  Administered 2024-01-20 – 2024-01-21 (×2): 500 mg via INTRAVENOUS
  Filled 2024-01-20 (×2): qty 25

## 2024-01-20 MED ORDER — TAMSULOSIN HCL 0.4 MG PO CAPS
0.4000 mg | ORAL_CAPSULE | Freq: Every day | ORAL | Status: DC
  Administered 2024-01-20 – 2024-01-21 (×2): 0.4 mg via ORAL
  Filled 2024-01-20 (×2): qty 1

## 2024-01-20 MED ORDER — PANTOPRAZOLE SODIUM 40 MG PO TBEC
40.0000 mg | DELAYED_RELEASE_TABLET | Freq: Two times a day (BID) | ORAL | Status: DC
  Administered 2024-01-20 – 2024-01-21 (×4): 40 mg via ORAL
  Filled 2024-01-20 (×4): qty 1

## 2024-01-20 MED ORDER — FUROSEMIDE 10 MG/ML IJ SOLN
30.0000 mg/h | INTRAVENOUS | Status: DC
Start: 2024-01-20 — End: 2024-01-22
  Administered 2024-01-20: 30 mg/h via INTRAVENOUS
  Administered 2024-01-20: 20 mg/h via INTRAVENOUS
  Administered 2024-01-21 – 2024-01-22 (×5): 30 mg/h via INTRAVENOUS
  Filled 2024-01-20 (×9): qty 20

## 2024-01-20 MED ORDER — DEXTROSE 5 % IV SOLN
160.0000 mg | Freq: Once | INTRAVENOUS | Status: AC
  Administered 2024-01-20: 160 mg via INTRAVENOUS
  Filled 2024-01-20: qty 16

## 2024-01-20 MED ORDER — INSULIN ASPART 100 UNIT/ML IJ SOLN
0.0000 [IU] | Freq: Three times a day (TID) | INTRAMUSCULAR | Status: DC
  Administered 2024-01-20: 3 [IU] via SUBCUTANEOUS
  Administered 2024-01-21: 2 [IU] via SUBCUTANEOUS
  Administered 2024-01-21: 3 [IU] via SUBCUTANEOUS

## 2024-01-20 MED ORDER — INSULIN GLARGINE-YFGN 100 UNIT/ML ~~LOC~~ SOLN
20.0000 [IU] | Freq: Every day | SUBCUTANEOUS | Status: DC
  Administered 2024-01-20 – 2024-01-21 (×2): 20 [IU] via SUBCUTANEOUS
  Filled 2024-01-20 (×3): qty 0.2

## 2024-01-20 MED ORDER — IRON SUCROSE 500 MG IVPB - SIMPLE MED
500.0000 mg | INTRAVENOUS | Status: DC
  Filled 2024-01-20: qty 275

## 2024-01-20 MED ORDER — ACETAZOLAMIDE 250 MG PO TABS
500.0000 mg | ORAL_TABLET | Freq: Two times a day (BID) | ORAL | Status: AC
  Administered 2024-01-20 (×2): 500 mg via ORAL
  Filled 2024-01-20 (×2): qty 2

## 2024-01-20 NOTE — Consult Note (Signed)
 Palliative Care Consult Note                                  Date: 01/20/2024   Patient Name: Richard Rivers  DOB: 02-28-1946  MRN: [id number]  Age / Sex: 78 y.o., male  [facility]P: [facility] Referring Physician: Lauralee Poll, MD  Reason for Consultation: Establishing goals of care  HPI/Patient Profile: 78 y.o. male  with past medical history of advanced COPD, chronic respiratory failure on home O2, chronic HFrEF, persistent atrial fibrillation, CAD status post CABG,  carotid artery disease with left CEA, and PAD s/p right femoropopliteal bypass who presented to the ED on 01/17/2024 with chest pain, shortness of breath, and cough. He is admitted with acute on chronic HF (with EF now less than 20%), hypotension, AKI, and acute on chronic anemia.   Per HF team, patient is not a candidate for advanced therapies.   Palliative Medicine was consulted for goals of care.   Subjective:   Extensive chart review has been completed including labs, vital signs, imaging, progress/consult notes, orders, medications and available advance directive documents.    Updates received from RN. Patient remains on low-dose vasopressor support. He was started on a lasix  drip today due to inadequate response to IV lasix  bolus.   I met with patient at bedside to discuss diagnosis, prognosis, and GOC. He appears very weak and fatigued.   I introduced Palliative Medicine as specialized medical care for people living with serious illness. It focuses on providing relief from the symptoms and stress of a serious illness.   We discussed patient's current illness and what it means in the larger context of his ongoing co-morbidities. Current clinical status was reviewed. Natural disease trajectory of heart failure was discussed.  Created space and opportunity for patient to express thoughts and feelings regarding current medical situation. Values and goals  of care were attempted to be elicited.  Patient states "I know I'm dying" and expresses concern he "doesn't have much time left".  We discussed the difference between continued full scope care and comfort care.   Patient shares that his main concern is disagreement among this family regarding his assets. He expresses needing to "get his affairs in order" and wants to speak with his family together at the same time. I offered to coordinate a family meeting tomorrow.  Patient is tearful and emotional throughout my visit. Emotional support provided.    Review of Systems  Constitutional:  Positive for fatigue.  Respiratory:  Positive for shortness of breath.     Objective:   Primary Diagnoses: Present on Admission:  Acute on chronic systolic CHF (congestive heart failure) (HCC)  COPD (chronic obstructive pulmonary disease) (HCC)  Hypertension  Atrial flutter (HCC)  CAD (coronary artery disease)  PAD (peripheral artery disease) (HCC)  Gout   Physical Exam Constitutional:      General: He is not in acute distress.    Appearance: He is ill-appearing.  Cardiovascular:     Rate and Rhythm: Normal rate.  Pulmonary:     Effort: No respiratory distress.  Neurological:     Mental Status: He is alert and oriented to person, place, and time.  Psychiatric:        Mood and Affect: Affect is tearful.    Palliative Assessment/Data: PPS 20%     Assessment & Plan:   SUMMARY OF RECOMMENDATIONS   Continue current supportive  interventions Patient understands his poor prognosis, wants to get his affairs in order with his family Plan for family meeting tomorrow  Primary Decision Maker: PATIENT  Code Status/Advance Care Planning: DNR - Limited  Symptom Management:  Per attending  Prognosis:  Poor in the setting of end-stage heart failure  Discharge Planning:  To Be Determined    Thank you for allowing us  to participate in the care of Richard Rivers   MDM -  High  Detailed review of medical records (labs, imaging, vital signs), medically appropriate exam, discussed with treatment team, counseling and education to patient, family, & staff, documenting clinical information, coordination of care.   Signed by: Maisie Scotland, NP Palliative Medicine Team  Team Phone # [phone]  For individual providers, please see AMION

## 2024-01-20 NOTE — Progress Notes (Signed)
 Inadequate UOP despite IV Lasix  bolus at 160 mg x 1 + lasix  gtt at 20/hr + diamox 500 mg bid, only 850 cc this shift.   BPs stable w/ on fixed dose NE. CVP remains elevated.   Increase lasix  gtt to 30/hr. Obtain f/u evening BMP.   Ruddy Corral, PA-C

## 2024-01-20 NOTE — Plan of Care (Signed)
  Problem: Health Behavior/Discharge Planning: Goal: Ability to manage health-related needs will improve Outcome: Progressing   Problem: Clinical Measurements: Goal: Ability to maintain clinical measurements within normal limits will improve Outcome: Not Progressing Goal: Will remain free from infection Outcome: Progressing Goal: Respiratory complications will improve Outcome: Progressing   Problem: Activity: Goal: Risk for activity intolerance will decrease Outcome: Not Progressing   Problem: Nutrition: Goal: Adequate nutrition will be maintained Outcome: Not Progressing

## 2024-01-20 NOTE — Progress Notes (Signed)
 Advanced Heart Failure Rounding Note  Cardiologist: Eilleen Grates, MD  Chief Complaint: Acute on Chronic Systolic Heart Failure   Patient Profile   78 y/o male w/ chronic systolic heart failure, PAD, CAD, extensive smoking history with COPD on 3 L of oxygen. He has had multiple admissions over the past 6 months for acute on chronic systolic heart failure, now readmitted w/ acute on chronic systolic heart failure with biventricular involvement + evidence of hepatic congestion and cardiorenal syndrome, felt to be end-staged. Moved to CCU for pressor/inotropic support to facilitate palliative diuresis.  Subjective:    Feels "lousy". Tired and fatigue. Still SOB, though he thinks this is slightly improved since yesterday.   He is currently on NE 5, co-ox 63%. Had 1.4L in UOP yesterday after 120 mg IV Lasix  x 1. CVP 14   Scr 2.8>>2.64 BUN 104 K 4.1   Objective:   Weight Range: 66.7 kg Body mass index is 19.94 kg/m.   Vital Signs:   Temp:  [97 F (36.1 C)-98.7 F (37.1 C)] 98.3 F (36.8 C) (05/22 0755) Pulse Rate:  [71-101] 79 (05/22 1000) Resp:  [15-25] 23 (05/22 1000) BP: (54-122)/(43-89) 98/57 (05/22 1000) SpO2:  [89 %-100 %] 100 % (05/22 1000) FiO2 (%):  [32 %] 32 % (05/21 1958) Weight:  [66.7 kg] 66.7 kg (05/22 0500) Last BM Date : 01/17/24  Weight change: Filed Weights   01/18/24 1649 01/19/24 0000 01/20/24 0500  Weight: 68.6 kg 68.1 kg 66.7 kg    Intake/Output:   Intake/Output Summary (Last 24 hours) at 01/20/2024 1016 Last data filed at 01/20/2024 0800 Gross per 24 hour  Intake 1545.73 ml  Output 1350 ml  Net 195.73 ml      Physical Exam    CVP 14  General:  elderly, fatigue appearing, on supp O2 via [place] but no increased WOB HEENT: Normal Neck: Supple. JVP elevated to jaw . Carotids 2+ bilat; no bruits. No lymphadenopathy or thyromegaly appreciated. Cor: PMI nondisplaced. Regular rate & rhythm.  Lungs: decreased BS at the bases  Abdomen: Soft,  nontender, nondistended.  Extremities: No cyanosis, clubbing, rash, 1+ LE on the left, trace edema on the rt   Telemetry   NSR 70s, personally reviewed   EKG    N/A   Labs    CBC Recent Labs    01/17/24 2220 01/18/24 0354 01/19/24 1136 01/19/24 1638 01/20/24 0410  WBC 9.8 9.8   < > 11.5* 12.6*  NEUTROABS 7.6 7.8*  --   --   --   HGB 7.1* 7.3*   < > 8.0* 8.2*  HCT 24.6* 25.2*   < > 27.0* 27.8*  MCV 85.7 86.0   < > 86.0 86.1  PLT 86* 81*   < > 98* 91*   < > = values in this interval not displayed.   Basic Metabolic Panel Recent Labs    16/10/96 0354 01/19/24 0426 01/19/24 1638 01/20/24 0410  NA 136 135 133* 132*  K 4.1 3.7 3.4* 4.1  CL 97* 98 95* 96*  CO2 28 29 28 28   GLUCOSE 107* 103* 230* 286*  BUN 111* 113* 103* 104*  CREATININE 2.84* 2.80* 2.67* 2.64*  CALCIUM  9.1 8.8* 8.6* 8.3*  MG 2.4 2.3  --   --    Liver Function Tests Recent Labs    01/19/24 0426 01/19/24 1638  AST 105* 83*  ALT 232* 205*  ALKPHOS 124 117  BILITOT 1.3* 1.6*  PROT 5.5* 5.7*  ALBUMIN 2.7*  2.7*   No results for input(s): "LIPASE", "AMYLASE" in the last 72 hours. Cardiac Enzymes No results for input(s): "CKTOTAL", "CKMB", "CKMBINDEX", "TROPONINI" in the last 72 hours.  BNP: BNP (last 3 results) Recent Labs    10/16/23 0653 11/24/23 0140 01/17/24 2220  BNP 910.2* >4,500.0* >4,500.0*    ProBNP (last 3 results) No results for input(s): "PROBNP" in the last 8760 hours.   D-Dimer No results for input(s): "DDIMER" in the last 72 hours. Hemoglobin A1C No results for input(s): "HGBA1C" in the last 72 hours. Fasting Lipid Panel No results for input(s): "CHOL", "HDL", "LDLCALC", "TRIG", "CHOLHDL", "LDLDIRECT" in the last 72 hours. Thyroid Function Tests Recent Labs    01/18/24 0351  TSH 2.559    Other results:   Imaging    No results found.   Medications:     Scheduled Medications:  acetaZOLAMIDE  500 mg Oral BID   allopurinol   300 mg Oral QPM    budesonide (PULMICORT) nebulizer solution  0.25 mg Nebulization BID   Chlorhexidine Gluconate Cloth  6 each Topical Daily   clopidogrel   75 mg Oral Daily   escitalopram   10 mg Oral QPM   famotidine   20 mg Oral QPM   feeding supplement  237 mL Oral BID BM   gabapentin   300 mg Oral BID   insulin  glargine-yfgn  20 Units Subcutaneous Daily   ipratropium-albuterol   3 mL Inhalation BID   sodium chloride  flush  10-40 mL Intracatheter Q12H   tamsulosin  0.4 mg Oral Daily   traZODone   100 mg Oral QHS    Infusions:  furosemide      furosemide  (LASIX ) 200 mg in dextrose  5 % 100 mL (2 mg/mL) infusion     iron sucrose (VENOFER) 500 mg in sodium chloride  0.9 % 250 mL IVPB     norepinephrine (LEVOPHED) Adult infusion 5 mcg/min (01/20/24 0800)    PRN Medications: busPIRone , sodium chloride  flush     Assessment/Plan   Acute on chronic HFrEF/Cardiogenic shock -EF 50% at time of LM PCI in 2023 -EF later 35-40% in 01/25 -Echo this admit EF < 20%, RV moderately reduced, RVSP 48 mmHg, moderate BAE, mild to moderate MR -Admission Lactic acid was normal. However, with AKI, elevated LFTs and hypotension significant concern for cardiogenic shock -He is end-stage w/ NHYHA class IV symptoms.  -He is not a candidate for advanced therapies or dialysis.  - Will try to stabilize him with inotrope support and diurese with goal to get him home to his dogs. Overall prognosis is poor. - Continue fixed dose NE, 5 mcg/min - Push diuretics. Give 160 mg bolus followed by continuous gtt at 20/hr + 500 mg of Diamox bid - Palliative Care consulted    2. AKI -Likely cardiorenal +/- ATN d/t shock -Baseline Scr 1.2, Scr peaked to 2.8 this admit -SCr improved today w/ NE and diuresis, down to 2.6 -Continue NE support and diuresis per above. Follow BMP  -Not an HD candidate   3. Elevated LFTs -In setting of shock  -Also with evidence of liver cirrhosis on US  -Continue HF optimization per above    4.  PAF -Currently SR -Anticoagulation on hold d/t severe anemia -Amiodarone  stopped d/t LFTs   5. CAD -Unstable angina 07/23 s/p LM stenting. 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 -LHC 02/25: moderate in stent restenosis involving LM stent, CTO RCA with left to right collaterals, 70% D1, 50% p LCX. CAD managed medically -HS troponin 389>326. Suspect demand ischemia -On  plavix . No statin with elevated LFTs   5. COPD -On home O2 -previously 4 ppd smoker   6. Anemia -Transfused w/ 2 u in February. Did not have GI workup -Hgb 6.7 on admit>>transfused 1uRBCs>>Hgb improved 8.2 today  -He denies gross bleeding but w/ elevated BUN (104) ? Possible GIB -Given he is likely heading toward palliative care, will not pursue additional w/u. Will check Iron studies and give IV Fe if indicated    7. GOC: Confirmed DNR status with patient -Overall prognosis is poor -Wants to get home to spend time with his dogs. Palliative Care consulted.  -Chaplain consulted for Advanced Directive paperwork  CRITICAL CARE Performed by: Ruddy Corral   Total critical care time: 15 minutes  Critical care time was exclusive of separately billable procedures and treating other patients.  Critical care was necessary to treat or prevent imminent or life-threatening deterioration.  Critical care was time spent personally by me on the following activities: development of treatment plan with patient and/or surrogate as well as nursing, discussions with consultants, evaluation of patient's response to treatment, examination of patient, obtaining history from patient or surrogate, ordering and performing treatments and interventions, ordering and review of laboratory studies, ordering and review of radiographic studies, pulse oximetry and re-evaluation of patient's condition.    Length of Stay: 2  Ruddy Corral, PA-C  01/20/2024, 10:16 AM  Advanced Heart Failure Team Pager [phone] (M-F; 7a -  5p)  Please contact [facility] for night-coverage after hours (5p -7a ) and weekends on [email or web address]

## 2024-01-20 NOTE — Progress Notes (Addendum)
 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  - 5th hospitalization this year - In the ED he was hypoxic, creatinine 2.8, BNP> 4500, troponin 400, 382, chest x-ray with hyperinflation cardiomegaly pulmonary vascular congestion and small pleural effusion, cards consulting - Admitted, started on diuretics, concern for low output state and hypotension - 5/21 seen by heart failure team, started on Levophed, transferred to ICU, hemoglobin down to 6.7, transfusing, holding IV heparin   Subjective: - Breathing a little better  Assessment and Plan:  Acute on chronic systolic and diastolic CHF Hypotension -Echo now with EF less than 20%, moderately reduced RV moderate MR, moderate TR -CHF team consulting, concern for end-stage heart failure, not a candidate for advanced therapies -Currently on norepinephrine to augment diuresis - Poor prognosis discussed with patient at length yesterday, palliative consulted  CAD history of PCI Continue clopidogrel .  -Holding heparin  with worsening anemia - Statin on hold  AKI (acute kidney injury) (HCC) -Baseline creatinine around 1.5, now 2.8 this admission, suspect cardiorenal etiology - Now improving on norepinephrine  Persistent Afib/Atrial flutter (HCC) Beta-blocker on hold Temporarily holding IV heparin  with worsening anemia  Acute on chronic anemia Severe iron deficiency - Poor candidate for GI workup at this time, transfused 1 unit PRBC yesterday, add  PPI, monitor hemoglobin - Conservative management  PAD (peripheral artery disease) (HCC) Continue clopidogrel    Advanced COPD Chronic respiratory  failure on 3 L home O2 -Previously smoked 4 packs/day, now down to 1 pack/day - Prognosis is very poor - Continue DuoNeb,  Pulmicort  DM2, hyperglycemia Add Semglee  Gout No signs of exacerbation   DVT prophylaxis: SCDs Code Status: Discussed CODE STATUS at length 5/21, he was finally agreeable to DNR Family Communication: None present, Disposition Plan: TBD  Consultants: Cards   Objective: Vitals:   01/20/24 0845 01/20/24 0900 01/20/24 1000 01/20/24 1054  BP:  109/66 (!) 98/57   Pulse: 77 79 79   Resp: 20 17 (!) 23   Temp:    98.3 F (36.8 C)  TempSrc:    Oral  SpO2:  100% 100%   Weight:      Height:        Intake/Output Summary (Last 24 hours) at 01/20/2024 1252 Last data filed at 01/20/2024 0800 Gross per 24 hour  Intake 1545.73 ml  Output 1350 ml  Net 195.73 ml   Filed Weights   01/18/24 1649 01/19/24 0000 01/20/24 0500  Weight: 68.6 kg 68.1 kg 66.7 kg    Examination:  General exam: Frail cachectic elderly male appears much older than stated age, AO x 3 HEENT: Positive JVD Respiratory system: Improving air movement, few scattered rhonchi Cardiovascular system: S1 & S2 heard, RRR.  Abd: nondistended, soft and nontender.Normal bowel sounds heard. Extremities: Trace edema Skin: No rashes Psychiatry: Flat affect   Data Reviewed:   CBC: Recent Labs  Lab 01/17/24 2220 01/18/24 0354 01/19/24 1136 01/19/24 1638 01/20/24 0410  WBC 9.8 9.8 11.9* 11.5* 12.6*  NEUTROABS 7.6 7.8*  --   --   --   HGB 7.1* 7.3* 6.7* 8.0* 8.2*  HCT 24.6* 25.2* 23.0* 27.0* 27.8*  MCV 85.7 86.0 83.9 86.0 86.1  PLT 86* 81* 93* 98* 91*   Basic Metabolic Panel: Recent Labs  Lab 01/17/24 2124 01/17/24 2125 01/18/24 0354 01/19/24 0426 01/19/24 1638 01/20/24 0410  NA 139 137 136 135 133* 132*  K 4.2 4.0 4.1 3.7 3.4* 4.1  CL 97*  --  97* 98 95* 96*  CO2 25  --  28 29 28 28   GLUCOSE 112*  --  107* 103* 230* 286*  BUN 111*  --  111* 113* 103* 104*  CREATININE 2.80*  --   2.84* 2.80* 2.67* 2.64*  CALCIUM  9.6  --  9.1 8.8* 8.6* 8.3*  MG  --   --  2.4 2.3  --   --    GFR: Estimated Creatinine Clearance: 21.4 mL/min (A) (by C-G formula based on SCr of 2.64 mg/dL (H)). Liver Function Tests: Recent Labs  Lab 01/17/24 2124 01/18/24 0354 01/19/24 0426 01/19/24 1638  AST 218* 177* 105* 83*  ALT 359* 314* 232* 205*  ALKPHOS 141* 125 124 117  BILITOT 1.7* 1.7* 1.3* 1.6*  PROT 6.4* 6.0* 5.5* 5.7*  ALBUMIN 3.1* 2.9* 2.7* 2.7*   No results for input(s): "LIPASE", "AMYLASE" in the last 168 hours. No results for input(s): "AMMONIA" in the last 168 hours. Coagulation Profile: Recent Labs  Lab 01/18/24 0354  INR 1.4*   Cardiac Enzymes: No results for input(s): "CKTOTAL", "CKMB", "CKMBINDEX", "TROPONINI" in the last 168 hours. BNP (last 3 results) No results for input(s): "PROBNP" in the last 8760 hours. HbA1C: No results for input(s): "HGBA1C" in the last 72 hours. CBG: Recent Labs  Lab 01/18/24 1651  GLUCAP 167*   Lipid Profile: No results for input(s): "CHOL", "HDL", "LDLCALC", "TRIG", "CHOLHDL", "LDLDIRECT" in the last 72 hours. Thyroid Function Tests: Recent Labs    01/18/24 0351  TSH 2.559   Anemia Panel: Recent Labs    01/19/24 1239  VITAMINB12 1,381*  FOLATE 13.4  FERRITIN 23*  TIBC 389  IRON 12*  RETICCTPCT 2.0   Urine analysis:    Component Value Date/Time   COLORURINE YELLOW 01/18/2024 0520   APPEARANCEUR CLEAR 01/18/2024 0520   LABSPEC 1.011 01/18/2024 0520   PHURINE 5.0 01/18/2024 0520   GLUCOSEU 50 (A) 01/18/2024 0520   HGBUR NEGATIVE 01/18/2024 0520   BILIRUBINUR NEGATIVE 01/18/2024 0520   KETONESUR NEGATIVE 01/18/2024 0520   PROTEINUR 100 (A) 01/18/2024 0520   UROBILINOGEN 1.0 10/05/2009 1342   NITRITE NEGATIVE 01/18/2024 0520   LEUKOCYTESUR NEGATIVE 01/18/2024 0520   Sepsis Labs: @LABRCNTIP (procalcitonin:4,lacticidven:4)  ) Recent Results (from the past 240 hours)  MRSA Next Gen by PCR, Nasal     Status:  None   Collection Time: 01/20/24  4:15 AM   Specimen: Nasal Mucosa; Nasal Swab  Result Value Ref Range Status   MRSA by PCR Next Gen NOT DETECTED NOT DETECTED Final    Comment: (NOTE) The GeneXpert MRSA Assay (FDA approved for NASAL specimens only), is one component of a comprehensive MRSA colonization surveillance program. It is not intended to diagnose MRSA infection nor to guide or monitor treatment for MRSA infections. Test performance is not FDA approved in patients less than 18 years old. Performed at [facility], [address], [place], [place] [address]      Radiology Studies: US  EKG SITE RITE Result Date: 01/18/2024 If Site Rite image not attached, placement could not be confirmed due to current cardiac rhythm.  ECHOCARDIOGRAM COMPLETE Result Date: 01/18/2024    ECHOCARDIOGRAM REPORT   Patient Name:   Richard Rivers  Date of Exam: 01/18/2024 Medical Rec #:  [id number]        Height:       72.0 in Accession #:    [id number]       Weight:       156.0 lb Date of Birth:  [birth year]        BSA:          1.917 m Patient Age:    53 years         BP:           96/73 mmHg Patient Gender: M                HR:           74 bpm. Exam Location:  Inpatient Procedure: 2D Echo, Cardiac Doppler, Color Doppler and Intracardiac            Opacification Agent (Both Spectral and Color Flow Doppler were            utilized during procedure). Indications:    CHF  History:        Patient has prior history of Echocardiogram examinations, most                 recent 10/17/2023. CHF, CAD, PAD and COPD, Arrythmias:Atrial                 Flutter; Risk Factors:Hypertension and Dyslipidemia. NSTEMI.  Sonographer:    Juanita Shaw Referring Phys: [id number] TAYLOR A PARCELLS IMPRESSIONS  1. Left ventricular thrombus was not seen (Definity  contrast was used). Left ventricular ejection fraction, by estimation, is <20%. The left ventricle has severely decreased function. The left ventricle demonstrates global  hypokinesis. The left ventricular internal cavity size was mildly dilated. Left ventricular diastolic parameters are consistent with Grade III diastolic dysfunction (restrictive). Elevated left atrial pressure.  2. Right ventricular systolic function is moderately reduced. The right ventricular size is normal. There is moderately elevated pulmonary artery systolic pressure. The estimated right ventricular systolic pressure is 47.9 mmHg.  3. Left atrial size was moderately dilated.  4. Right atrial size was moderately dilated.  5. The mitral valve is normal in structure. Mild to moderate mitral valve regurgitation. No evidence of mitral stenosis.  6. Tricuspid valve regurgitation is moderate.  7. The aortic valve is tricuspid. Aortic valve regurgitation is not visualized. No aortic stenosis is present.  8. The inferior vena cava is dilated in size with <50% respiratory variability, suggesting right atrial pressure of 15 mmHg. Comparison(s): Prior images reviewed side by side. The left ventricular systolic function is significantly worse. The left ventricular diastolic function is significantly worse. FINDINGS  Left Ventricle: Left ventricular thrombus was not seen (Definity  contrast was used). Left ventricular ejection fraction, by estimation, is <20%. The left ventricle has severely decreased function. The left ventricle demonstrates global hypokinesis. The left ventricular internal cavity size was mildly dilated. There is no left ventricular hypertrophy. Abnormal (paradoxical) septal motion, consistent with left bundle branch block. Left ventricular diastolic parameters are consistent with Grade III diastolic dysfunction (restrictive). Elevated left atrial pressure. Right Ventricle: The right ventricular size is normal. No increase in right ventricular wall thickness. Right ventricular systolic function is moderately reduced. There is moderately elevated pulmonary artery systolic pressure. The tricuspid regurgitant  velocity is 2.87 m/s, and with an assumed right atrial pressure of 15 mmHg, the estimated right ventricular systolic pressure is 47.9 mmHg. Left Atrium: Left atrial size was moderately dilated. Right Atrium: Right atrial size  was moderately dilated. Pericardium: There is no evidence of pericardial effusion. Mitral Valve: The mitral valve is normal in structure. Mild to moderate mitral valve regurgitation, with centrally-directed jet. No evidence of mitral valve stenosis. MV peak gradient, 2.9 mmHg. The mean mitral valve gradient is 1.0 mmHg. Tricuspid Valve: The tricuspid valve is normal in structure. Tricuspid valve regurgitation is moderate. Aortic Valve: The aortic valve is tricuspid. Aortic valve regurgitation is not visualized. No aortic stenosis is present. Aortic valve mean gradient measures 1.0 mmHg. Aortic valve peak gradient measures 2.0 mmHg. Aortic valve area, by VTI measures 2.17 cm. Pulmonic Valve: The pulmonic valve was grossly normal. Pulmonic valve regurgitation is trivial. No evidence of pulmonic stenosis. Aorta: The aortic root and ascending aorta are structurally normal, with no evidence of dilitation. Venous: The inferior vena cava is dilated in size with less than 50% respiratory variability, suggesting right atrial pressure of 15 mmHg. IAS/Shunts: No atrial level shunt detected by color flow Doppler.  LEFT VENTRICLE PLAX 2D LVIDd:         5.70 cm      Diastology LVIDs:         5.20 cm      LV e' lateral:   6.56 cm/s LV PW:         0.90 cm      LV E/e' lateral: 10.7 LV IVS:        0.90 cm LVOT diam:     2.00 cm LV SV:         23 LV SV Index:   12 LVOT Area:     3.14 cm  LV Volumes (MOD) LV vol d, MOD A2C: 153.0 ml LV vol d, MOD A4C: 230.0 ml LV vol s, MOD A2C: 96.2 ml LV vol s, MOD A4C: 145.0 ml LV SV MOD A2C:     56.8 ml LV SV MOD A4C:     230.0 ml LV SV MOD BP:      68.0 ml RIGHT VENTRICLE            IVC RV Basal diam:  3.30 cm    IVC diam: 1.90 cm RV Mid diam:    2.70 cm RV S prime:      7.00 cm/s TAPSE (M-mode): 1.5 cm LEFT ATRIUM             Index        RIGHT ATRIUM           Index LA diam:        4.50 cm 2.35 cm/m   RA Area:     12.20 cm LA Vol (A2C):   29.5 ml 15.39 ml/m  RA Volume:   29.10 ml  15.18 ml/m LA Vol (A4C):   46.0 ml 24.00 ml/m LA Biplane Vol: 38.7 ml 20.19 ml/m  AORTIC VALVE                    PULMONIC VALVE AV Area (Vmax):    2.07 cm     PV Vmax:          0.52 m/s AV Area (Vmean):   1.99 cm     PV Peak grad:     1.1 mmHg AV Area (VTI):     2.17 cm     PR End Diast Vel: 2.50 msec AV Vmax:           70.20 cm/s AV Vmean:          46.300 cm/s AV VTI:  0.107 m AV Peak Grad:      2.0 mmHg AV Mean Grad:      1.0 mmHg LVOT Vmax:         46.30 cm/s LVOT Vmean:        29.400 cm/s LVOT VTI:          0.074 m LVOT/AV VTI ratio: 0.69  AORTA Ao Root diam: 3.50 cm Ao Asc diam:  3.60 cm MITRAL VALVE               TRICUSPID VALVE MV Area (PHT): 5.96 cm    TR Peak grad:   32.9 mmHg MV Area VTI:   1.24 cm    TR Vmax:        287.00 cm/s MV Peak grad:  2.9 mmHg MV Mean grad:  1.0 mmHg    SHUNTS MV Vmax:       0.85 m/s    Systemic VTI:  0.07 m MV Vmean:      46.4 cm/s   Systemic Diam: 2.00 cm MV Decel Time: 127 msec MR Peak grad: 97.2 mmHg MR Vmax:      493.00 cm/s MV E velocity: 70.20 cm/s MV A velocity: 36.70 cm/s MV E/A ratio:  1.91 Mihai Croitoru MD Electronically signed by Luana Rumple MD Signature Date/Time: 01/18/2024/4:54:59 PM    Final      Scheduled Meds:  acetaZOLAMIDE  500 mg Oral BID   allopurinol   300 mg Oral QPM   budesonide (PULMICORT) nebulizer solution  0.25 mg Nebulization BID   Chlorhexidine Gluconate Cloth  6 each Topical Daily   clopidogrel   75 mg Oral Daily   escitalopram   10 mg Oral QPM   famotidine   20 mg Oral QPM   feeding supplement  237 mL Oral BID BM   gabapentin   300 mg Oral BID   insulin  glargine-yfgn  20 Units Subcutaneous Daily   ipratropium-albuterol   3 mL Inhalation BID   sodium chloride  flush  10-40 mL Intracatheter Q12H    tamsulosin  0.4 mg Oral Daily   traZODone   100 mg Oral QHS   Continuous Infusions:  furosemide  (LASIX ) 200 mg in dextrose  5 % 100 mL (2 mg/mL) infusion 20 mg/hr (01/20/24 1037)   iron sucrose (VENOFER) 500 mg in sodium chloride  0.9 % 250 mL IVPB 500 mg (01/20/24 1126)   norepinephrine (LEVOPHED) Adult infusion 5 mcg/min (01/20/24 0800)     LOS: 2 days    Time spent:    Deforest Fast, MD [facility]   01/20/2024, 12:52 PM

## 2024-01-20 NOTE — Progress Notes (Signed)
 Chaplain facilitated AD signing/notarization. Document given to Chaplain Jerry for copies/submission.

## 2024-01-20 NOTE — Progress Notes (Signed)
   01/20/24 1108  Spiritual Encounters  Type of Visit Follow up  Care provided to: Pt and family  Conversation partners present during encounter Nurse  Reason for visit Advance directives  OnCall Visit No  Spiritual Framework  Presenting Themes Courage hope and growth  Community/Connection Family  Strengths family  Patient Stress Factors Major life changes;Exhausted  Family Stress Factors Exhausted;Lack of caregivers;Family relationships;Major life changes   Chaplain  follow up with PT and family to assist with the complete of the AD form. PT successfully fill out the document and is ow awaiting to Patient Services serve as witness to sign AD.  Chaplain advise the PT and family that they would return around 12:30 pm to proceed with he notarization process.  No spiritual concerns expressed during visit. PT and family appeared clam and focus on completing the necessary documentation.  Provided support and clarification regarding the AD process. Coordinated next steps with Patient Services and ensured the PT understood the plan for notarization.

## 2024-01-21 DIAGNOSIS — I9589 Other hypotension: Secondary | ICD-10-CM | POA: Diagnosis not present

## 2024-01-21 DIAGNOSIS — Z515 Encounter for palliative care: Secondary | ICD-10-CM | POA: Diagnosis not present

## 2024-01-21 DIAGNOSIS — I5023 Acute on chronic systolic (congestive) heart failure: Secondary | ICD-10-CM | POA: Diagnosis not present

## 2024-01-21 DIAGNOSIS — S36119A Unspecified injury of liver, initial encounter: Secondary | ICD-10-CM | POA: Diagnosis not present

## 2024-01-21 DIAGNOSIS — I5043 Acute on chronic combined systolic (congestive) and diastolic (congestive) heart failure: Secondary | ICD-10-CM | POA: Diagnosis not present

## 2024-01-21 DIAGNOSIS — N179 Acute kidney failure, unspecified: Secondary | ICD-10-CM | POA: Diagnosis not present

## 2024-01-21 DIAGNOSIS — Z7189 Other specified counseling: Secondary | ICD-10-CM | POA: Diagnosis not present

## 2024-01-21 LAB — BASIC METABOLIC PANEL WITH GFR
Anion gap: 8 (ref 5–15)
BUN: 95 mg/dL — ABNORMAL HIGH (ref 8–23)
CO2: 31 mmol/L (ref 22–32)
Calcium: 8.5 mg/dL — ABNORMAL LOW (ref 8.9–10.3)
Chloride: 95 mmol/L — ABNORMAL LOW (ref 98–111)
Creatinine, Ser: 2.29 mg/dL — ABNORMAL HIGH (ref 0.61–1.24)
GFR, Estimated: 28 mL/min — ABNORMAL LOW (ref >60)
Glucose, Bld: 181 mg/dL — ABNORMAL HIGH (ref 70–99)
Potassium: 3.4 mmol/L — ABNORMAL LOW (ref 3.5–5.1)
Sodium: 134 mmol/L — ABNORMAL LOW (ref 135–145)

## 2024-01-21 LAB — COOXEMETRY PANEL
Carboxyhemoglobin: 2.2 % — ABNORMAL HIGH (ref 0.5–1.5)
Methemoglobin: 0.7 % (ref 0.0–1.5)
O2 Saturation: 73.3 %
Total hemoglobin: 8.7 g/dL — ABNORMAL LOW (ref 12.0–16.0)

## 2024-01-21 LAB — CBC
HCT: 27.7 % — ABNORMAL LOW (ref 39.0–52.0)
Hemoglobin: 8.2 g/dL — ABNORMAL LOW (ref 13.0–17.0)
MCH: 25.5 pg — ABNORMAL LOW (ref 26.0–34.0)
MCHC: 29.6 g/dL — ABNORMAL LOW (ref 30.0–36.0)
MCV: 86 fL (ref 80.0–100.0)
Platelets: 84 10*3/uL — ABNORMAL LOW (ref 150–400)
RBC: 3.22 MIL/uL — ABNORMAL LOW (ref 4.22–5.81)
RDW: 17.2 % — ABNORMAL HIGH (ref 11.5–15.5)
WBC: 13.2 10*3/uL — ABNORMAL HIGH (ref 4.0–10.5)
nRBC: 0.2 % (ref 0.0–0.2)

## 2024-01-21 LAB — HEPATIC FUNCTION PANEL
ALT: 139 U/L — ABNORMAL HIGH (ref 0–44)
AST: 40 U/L (ref 15–41)
Albumin: 2.5 g/dL — ABNORMAL LOW (ref 3.5–5.0)
Alkaline Phosphatase: 123 U/L (ref 38–126)
Bilirubin, Direct: 0.6 mg/dL — ABNORMAL HIGH (ref 0.0–0.2)
Indirect Bilirubin: 1 mg/dL — ABNORMAL HIGH (ref 0.3–0.9)
Total Bilirubin: 1.6 mg/dL — ABNORMAL HIGH (ref 0.0–1.2)
Total Protein: 5.8 g/dL — ABNORMAL LOW (ref 6.5–8.1)

## 2024-01-21 LAB — GLUCOSE, CAPILLARY
Glucose-Capillary: 101 mg/dL — ABNORMAL HIGH (ref 70–99)
Glucose-Capillary: 147 mg/dL — ABNORMAL HIGH (ref 70–99)
Glucose-Capillary: 165 mg/dL — ABNORMAL HIGH (ref 70–99)
Glucose-Capillary: 181 mg/dL — ABNORMAL HIGH (ref 70–99)

## 2024-01-21 LAB — HEMOGLOBIN A1C
Hgb A1c MFr Bld: 5.8 % — ABNORMAL HIGH (ref 4.8–5.6)
Mean Plasma Glucose: 119.76 mg/dL

## 2024-01-21 MED ORDER — POTASSIUM CHLORIDE CRYS ER 20 MEQ PO TBCR
40.0000 meq | EXTENDED_RELEASE_TABLET | Freq: Once | ORAL | Status: AC
  Administered 2024-01-21: 40 meq via ORAL
  Filled 2024-01-21: qty 2

## 2024-01-21 MED ORDER — POTASSIUM CHLORIDE CRYS ER 20 MEQ PO TBCR
60.0000 meq | EXTENDED_RELEASE_TABLET | Freq: Once | ORAL | Status: AC
  Administered 2024-01-21: 60 meq via ORAL
  Filled 2024-01-21: qty 3

## 2024-01-21 MED ORDER — ACETAZOLAMIDE 250 MG PO TABS
500.0000 mg | ORAL_TABLET | Freq: Two times a day (BID) | ORAL | Status: AC
  Administered 2024-01-21 (×2): 500 mg via ORAL
  Filled 2024-01-21 (×2): qty 2

## 2024-01-21 NOTE — Progress Notes (Signed)
 Advanced Heart Failure Rounding Note  Cardiologist: Eilleen Grates, MD  Chief Complaint: Acute on Chronic Systolic Heart Failure   Patient Profile   78 y/o male w/ chronic systolic heart failure, PAD, CAD, extensive smoking history with COPD on 3 L of oxygen. He has had multiple admissions over the past 6 months for acute on chronic systolic heart failure, now readmitted w/ acute on chronic systolic heart failure with biventricular involvement + evidence of hepatic congestion and cardiorenal syndrome, felt to be end-staged. Moved to CCU for pressor/inotropic support to facilitate palliative diuresis.  Subjective:    Continues on fixed dose NE at 5 mcg/min. SBPs 90s, Co-ox 73%.   On Lasix  gtt at 30/hr. 4.1L in UOP yesterday. SCr>>2.64>>2.29. Breathing slightly improved but still feels tired and weak. CVP 5.    Objective:   Weight Range: 65.4 kg Body mass index is 19.55 kg/m.   Vital Signs:   Temp:  [97.4 F (36.3 C)-98.3 F (36.8 C)] 97.9 F (36.6 C) (05/23 0400) Pulse Rate:  [73-86] 77 (05/23 0600) Resp:  [15-23] 15 (05/23 0600) BP: (91-117)/(42-77) 103/46 (05/23 0600) SpO2:  [96 %-100 %] 100 % (05/23 0800) Weight:  [65.4 kg] 65.4 kg (05/23 0426) Last BM Date : 01/17/24  Weight change: Filed Weights   01/19/24 0000 01/20/24 0500 01/21/24 0426  Weight: 68.1 kg 66.7 kg 65.4 kg    Intake/Output:   Intake/Output Summary (Last 24 hours) at 01/21/2024 0836 Last data filed at 01/21/2024 0600 Gross per 24 hour  Intake 948.88 ml  Output 4050 ml  Net -3101.12 ml      Physical Exam    CVP 5  General:  elderly, chronically ill/ weak and fatigued appearing. No respiratory difficulty HEENT: normal Neck: supple. JVD 6 cm. Carotids 2+ bilat; no bruits. No lymphadenopathy or thyromegaly appreciated. Cor: PMI nondisplaced. Regular rate & rhythm. No rubs, gallops or murmurs. Lungs: decreased BS at the bases  Abdomen: soft, nontender, nondistended. +RUE PICC Extremities:  no cyanosis, clubbing, rash, trace b/l LE edema  Telemetry   NSR 80s, personally reviewed   EKG    N/A   Labs    CBC Recent Labs    01/20/24 0410 01/21/24 0413  WBC 12.6* 13.2*  HGB 8.2* 8.2*  HCT 27.8* 27.7*  MCV 86.1 86.0  PLT 91* 84*   Basic Metabolic Panel Recent Labs    40/98/11 0426 01/19/24 1638 01/20/24 1723 01/21/24 0413  NA 135   < > 134* 134*  K 3.7   < > 4.2 3.4*  CL 98   < > 96* 95*  CO2 29   < > 29 31  GLUCOSE 103*   < > 191* 181*  BUN 113*   < > 103* 95*  CREATININE 2.80*   < > 2.32* 2.29*  CALCIUM  8.8*   < > 8.5* 8.5*  MG 2.3  --   --   --    < > = values in this interval not displayed.   Liver Function Tests Recent Labs    01/19/24 1638 01/21/24 0413  AST 83* 40  ALT 205* 139*  ALKPHOS 117 123  BILITOT 1.6* 1.6*  PROT 5.7* 5.8*  ALBUMIN 2.7* 2.5*   No results for input(s): "LIPASE", "AMYLASE" in the last 72 hours. Cardiac Enzymes No results for input(s): "CKTOTAL", "CKMB", "CKMBINDEX", "TROPONINI" in the last 72 hours.  BNP: BNP (last 3 results) Recent Labs    10/16/23 0653 11/24/23 0140 01/17/24 2220  BNP 910.2* >4,500.0* >  4,500.0*    ProBNP (last 3 results) No results for input(s): "PROBNP" in the last 8760 hours.   D-Dimer No results for input(s): "DDIMER" in the last 72 hours. Hemoglobin A1C Recent Labs    01/21/24 0413  HGBA1C 5.8*   Fasting Lipid Panel No results for input(s): "CHOL", "HDL", "LDLCALC", "TRIG", "CHOLHDL", "LDLDIRECT" in the last 72 hours. Thyroid Function Tests No results for input(s): "TSH", "T4TOTAL", "T3FREE", "THYROIDAB" in the last 72 hours.  Invalid input(s): "FREET3"   Other results:   Imaging    No results found.   Medications:     Scheduled Medications:  acetaZOLAMIDE  500 mg Oral BID   allopurinol   300 mg Oral QPM   budesonide (PULMICORT) nebulizer solution  0.25 mg Nebulization BID   Chlorhexidine Gluconate Cloth  6 each Topical Daily   clopidogrel   75 mg Oral  Daily   escitalopram   10 mg Oral QPM   famotidine   20 mg Oral QPM   feeding supplement  237 mL Oral BID BM   gabapentin   300 mg Oral BID   insulin  aspart  0-15 Units Subcutaneous TID WC   insulin  glargine-yfgn  20 Units Subcutaneous Daily   pantoprazole  40 mg Oral BID   sodium chloride  flush  10-40 mL Intracatheter Q12H   tamsulosin  0.4 mg Oral Daily   traZODone   100 mg Oral QHS    Infusions:  furosemide  (LASIX ) 200 mg in dextrose  5 % 100 mL (2 mg/mL) infusion 30 mg/hr (01/21/24 0807)   iron sucrose (VENOFER) 500 mg in sodium chloride  0.9 % 250 mL IVPB Stopped (01/20/24 1531)   norepinephrine (LEVOPHED) Adult infusion 5 mcg/min (01/21/24 0426)    PRN Medications: busPIRone , ipratropium-albuterol , sodium chloride  flush     Assessment/Plan   Acute on chronic HFrEF/Cardiogenic shock -EF 50% at time of LM PCI in 2023 -EF later 35-40% in 01/25 -Echo this admit EF < 20%, RV moderately reduced, RVSP 48 mmHg, moderate BAE, mild to moderate MR -Admission Lactic acid was normal. However, with AKI, elevated LFTs and hypotension significant concern for cardiogenic shock -He is end-stage w/ NHYHA class IV symptoms.  -He is not a candidate for advanced therapies or dialysis.  - Will try to stabilize him with inotrope support and diurese with goal to get him to residential hospice facility. Overall prognosis is poor. - Continue fixed dose NE, 5 mcg/min - Continue Lasix  gtt at 30/hr + Diamox 500 mg bid  - Palliative Care consulted. Will see today     2. AKI -Likely cardiorenal +/- ATN d/t shock -Baseline Scr 1.2, Scr peaked to 2.8 this admit -SCr improved today w/ NE and diuresis, down to 2.3 -Continue NE support and diuresis per above. Follow BMP  -Not an HD candidate   3. Elevated LFTs -In setting of shock  -Also with evidence of liver cirrhosis on US  -Continue HF optimization per above    4. PAF -Currently SR -Anticoagulation on hold d/t severe anemia -Amiodarone  stopped  d/t LFTs   5. CAD -Unstable angina 07/23 s/p LM stenting. 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 -LHC 02/25: moderate in stent restenosis involving LM stent, CTO RCA with left to right collaterals, 70% D1, 50% p LCX. CAD managed medically -HS troponin 389>326. Suspect demand ischemia -On plavix . No statin with elevated LFTs   5. COPD -On home O2 -previously 4 ppd smoker   6. Anemia -Transfused w/ 2 u in February. Did not have GI workup -Hgb 6.7 on admit>>transfused  1uRBCs>>Hgb improved 8.2 today  -He denies gross bleeding but w/ elevated BUN (104) ? Possible GIB -Given he is likely heading toward palliative care, will not pursue additional w/u.  -Labs c/w IDA. Has received IV Fe    7. Hypokalemia - K 3.4 - give K supp, d/w pharmD    8. GOC: Confirmed DNR status with patient -Overall prognosis is poor -Wants to get home to spend time with his dogs. Palliative Care consulted.  -Chaplain consulted for Advanced Directive paperwork  CRITICAL CARE Performed by: Ruddy Corral   Total critical care time: 15 minutes  Critical care time was exclusive of separately billable procedures and treating other patients.  Critical care was necessary to treat or prevent imminent or life-threatening deterioration.  Critical care was time spent personally by me on the following activities: development of treatment plan with patient and/or surrogate as well as nursing, discussions with consultants, evaluation of patient's response to treatment, examination of patient, obtaining history from patient or surrogate, ordering and performing treatments and interventions, ordering and review of laboratory studies, ordering and review of radiographic studies, pulse oximetry and re-evaluation of patient's condition.   Length of Stay: [address] Elissa Guise  01/21/2024, 8:36 AM  Advanced Heart Failure Team Pager [phone] (M-F; 7a - 5p)  Please contact [facility] for  night-coverage after hours (5p -7a ) and weekends on [email or web address]

## 2024-01-21 NOTE — Progress Notes (Addendum)
 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  - 5th hospitalization this year - In the ED he was hypoxic, creatinine 2.8, BNP> 4500, troponin 400, 382, chest x-ray with hyperinflation cardiomegaly pulmonary vascular congestion and small pleural effusion, cards consulting - Admitted, started on diuretics, concern for low output state and hypotension - 5/21 seen by heart failure team, started on Levophed, transferred to ICU, hemoglobin down to 6.7, transfusing, holding IV heparin   Subjective: - Breathing a little better  Assessment and Plan:  Acute on chronic systolic and diastolic CHF Hypotension -Echo now with EF less than 20%, moderately reduced RV moderate MR, moderate TR -CHF team consulting, concern for end-stage heart failure, not a candidate for advanced therapies -Currently on norepinephrine, Lasix  gtt. - Discussed poor prognosis with patient's son and his wife at bedside today, recommended consideration of home hospice at discharge, they seem to be understanding of this recommendation  CAD history of PCI Continue clopidogrel .  -Holding heparin  with worsening anemia - Statin on hold  AKI (acute kidney injury) (HCC) -Baseline creatinine around 1.5, now 2.8 this admission, suspect cardiorenal etiology - Now improving on norepinephrine  Persistent Afib/Atrial flutter (HCC) Beta-blocker on hold Temporarily holding IV heparin  with worsening anemia  Acute on chronic anemia Severe iron deficiency - Poor candidate for GI workup at this time, transfused 1 unit PRBC 5/21, add  PPI, monitor hemoglobin - Conservative management  PAD  (peripheral artery disease) (HCC) Continue clopidogrel    Advanced COPD Chronic respiratory failure on 3 L home O2 -Previously smoked 4 packs/day, now down to 1 pack/day - Prognosis is very poor - Continue DuoNeb,  Pulmicort  DM2, hyperglycemia -Continue current dose of Semglee, A1c is 5.8  Gout No signs of exacerbation   DVT prophylaxis: SCDs Code Status: Discussed CODE STATUS at length 5/21, he was finally agreeable to DNR Family Communication: son at bedside Disposition Plan: TBD  Consultants: Cards   Objective: Vitals:   01/21/24 1059 01/21/24 1100 01/21/24 1101 01/21/24 1143  BP:  (!) 105/49    Pulse: 86 86 85   Resp: 19 (!) 21 18   Temp:    98.2 F (36.8 C)  TempSrc:    Oral  SpO2: 100% 100% 100%   Weight:      Height:        Intake/Output Summary (Last 24 hours) at 01/21/2024 1151 Last data filed at 01/21/2024 0900 Gross per 24 hour  Intake 1016.02 ml  Output 4600 ml  Net -3583.98 ml   Filed Weights   01/19/24 0000 01/20/24 0500 01/21/24 0426  Weight: 68.1 kg 66.7 kg 65.4 kg    Examination:  General exam: Frail cachectic elderly male appears older than stated age, AO x 3 HEENT: Positive JVD CVS: Poor air movement, few scattered rhonchi Cardiovascular system: S1 & S2 heard, RRR.  Abd: nondistended, soft and nontender.Normal bowel sounds heard. Extremities: Trace edema Skin: No rashes Psychiatry: Flat affect   Data Reviewed:   CBC: Recent Labs  Lab 01/17/24 2220 01/18/24 0354 01/19/24 1136 01/19/24 1638 01/20/24 0410 01/21/24 0413  WBC 9.8 9.8 11.9* 11.5* 12.6* 13.2*  NEUTROABS 7.6 7.8*  --   --   --   --  HGB 7.1* 7.3* 6.7* 8.0* 8.2* 8.2*  HCT 24.6* 25.2* 23.0* 27.0* 27.8* 27.7*  MCV 85.7 86.0 83.9 86.0 86.1 86.0  PLT 86* 81* 93* 98* 91* 84*   Basic Metabolic Panel: Recent Labs  Lab 01/18/24 0354 01/19/24 0426 01/19/24 1638 01/20/24 0410 01/20/24 1723 01/21/24 0413  NA 136 135 133* 132* 134* 134*  K 4.1 3.7 3.4* 4.1 4.2  3.4*  CL 97* 98 95* 96* 96* 95*  CO2 28 29 28 28 29 31   GLUCOSE 107* 103* 230* 286* 191* 181*  BUN 111* 113* 103* 104* 103* 95*  CREATININE 2.84* 2.80* 2.67* 2.64* 2.32* 2.29*  CALCIUM  9.1 8.8* 8.6* 8.3* 8.5* 8.5*  MG 2.4 2.3  --   --   --   --    GFR: Estimated Creatinine Clearance: 24.2 mL/min (A) (by C-G formula based on SCr of 2.29 mg/dL (H)). Liver Function Tests: Recent Labs  Lab 01/17/24 2124 01/18/24 0354 01/19/24 0426 01/19/24 1638 01/21/24 0413  AST 218* 177* 105* 83* 40  ALT 359* 314* 232* 205* 139*  ALKPHOS 141* 125 124 117 123  BILITOT 1.7* 1.7* 1.3* 1.6* 1.6*  PROT 6.4* 6.0* 5.5* 5.7* 5.8*  ALBUMIN 3.1* 2.9* 2.7* 2.7* 2.5*   No results for input(s): "LIPASE", "AMYLASE" in the last 168 hours. No results for input(s): "AMMONIA" in the last 168 hours. Coagulation Profile: Recent Labs  Lab 01/18/24 0354  INR 1.4*   Cardiac Enzymes: No results for input(s): "CKTOTAL", "CKMB", "CKMBINDEX", "TROPONINI" in the last 168 hours. BNP (last 3 results) No results for input(s): "PROBNP" in the last 8760 hours. HbA1C: Recent Labs    01/21/24 0413  HGBA1C 5.8*   CBG: Recent Labs  Lab 01/18/24 1651 01/20/24 1604 01/20/24 2104 01/21/24 0638  GLUCAP 167* 182* 138* 101*   Lipid Profile: No results for input(s): "CHOL", "HDL", "LDLCALC", "TRIG", "CHOLHDL", "LDLDIRECT" in the last 72 hours. Thyroid Function Tests: No results for input(s): "TSH", "T4TOTAL", "FREET4", "T3FREE", "THYROIDAB" in the last 72 hours.  Anemia Panel: Recent Labs    01/19/24 1239  VITAMINB12 1,381*  FOLATE 13.4  FERRITIN 23*  TIBC 389  IRON 12*  RETICCTPCT 2.0   Urine analysis:    Component Value Date/Time   COLORURINE YELLOW 01/18/2024 0520   APPEARANCEUR CLEAR 01/18/2024 0520   LABSPEC 1.011 01/18/2024 0520   PHURINE 5.0 01/18/2024 0520   GLUCOSEU 50 (A) 01/18/2024 0520   HGBUR NEGATIVE 01/18/2024 0520   BILIRUBINUR NEGATIVE 01/18/2024 0520   KETONESUR NEGATIVE 01/18/2024  0520   PROTEINUR 100 (A) 01/18/2024 0520   UROBILINOGEN 1.0 10/05/2009 1342   NITRITE NEGATIVE 01/18/2024 0520   LEUKOCYTESUR NEGATIVE 01/18/2024 0520   Sepsis Labs: @LABRCNTIP (procalcitonin:4,lacticidven:4)  ) Recent Results (from the past 240 hours)  MRSA Next Gen by PCR, Nasal     Status: None   Collection Time: 01/20/24  4:15 AM   Specimen: Nasal Mucosa; Nasal Swab  Result Value Ref Range Status   MRSA by PCR Next Gen NOT DETECTED NOT DETECTED Final    Comment: (NOTE) The GeneXpert MRSA Assay (FDA approved for NASAL specimens only), is one component of a comprehensive MRSA colonization surveillance program. It is not intended to diagnose MRSA infection nor to guide or monitor treatment for MRSA infections. Test performance is not FDA approved in patients less than 61 years old. Performed at [facility], [address] [place], [place] [address]      Radiology Studies: No results found.    Scheduled Meds:  acetaZOLAMIDE  500 mg Oral BID   allopurinol   300 mg Oral QPM   budesonide (PULMICORT) nebulizer solution  0.25 mg Nebulization BID   Chlorhexidine Gluconate Cloth  6 each Topical Daily   clopidogrel   75 mg Oral Daily   escitalopram   10 mg Oral QPM   famotidine   20 mg Oral QPM   feeding supplement  237 mL Oral BID BM   gabapentin   300 mg Oral BID   insulin  aspart  0-15 Units Subcutaneous TID WC   insulin  glargine-yfgn  20 Units Subcutaneous Daily   pantoprazole  40 mg Oral BID   potassium chloride  40 mEq Oral Once   sodium chloride  flush  10-40 mL Intracatheter Q12H   tamsulosin  0.4 mg Oral Daily   traZODone   100 mg Oral QHS   Continuous Infusions:  furosemide  (LASIX ) 200 mg in dextrose  5 % 100 mL (2 mg/mL) infusion 30 mg/hr (01/21/24 0900)   iron sucrose (VENOFER) 500 mg in sodium chloride  0.9 % 250 mL IVPB Stopped (01/20/24 1531)   norepinephrine (LEVOPHED) Adult infusion 5 mcg/min (01/21/24 0900)     LOS: 3 days    Time spent:     Deforest Fast, MD [facility]   01/21/2024, 11:51 AM

## 2024-01-21 NOTE — Progress Notes (Signed)
 Palliative Medicine Progress Note   Patient Name: Richard Rivers       Date: 01/21/2024 DOB: 12-May-1946  Age: 78 y.o. MRN#: [id number] Attending Physician: Lauralee Poll, MD Primary Care Physician: [facility] Admit Date: 01/17/2024   HPI/Patient Profile: 78 y.o. male  with past medical history of advanced COPD, chronic respiratory failure on home O2, chronic HFrEF, persistent atrial fibrillation, CAD status post CABG,  carotid artery disease with left CEA, and PAD s/p right femoropopliteal bypass who presented to the ED on 01/17/2024 with chest pain, shortness of breath, and cough. He is admitted with acute on chronic HF (with EF now less than 20%), hypotension, AKI, and acute on chronic anemia.    Per HF team, patient is not a candidate for advanced therapies.    Palliative Medicine was consulted for goals of care.    Subjective: Chart reviewed. Update received from RN. Patient assessed at bedside. He is very drowsy; unable to stay awake during my visit.   I met with patient's brother and [facility]) at bedside. He understands patient is at end-of-life. Discussed that patient is not likely stable for transfer to an inpatient hospice facility at this point. Brother reports family is planning to help patient get his financial affairs in order today. He also reports that patient's main wish is to be able to see his beloved dog before he dies.  16:40 - I spoke with brother again by phone. He reports notary is on the way to complete  documents. He plans to get patient's dog here by tomorrow. We discussed plan for transition to comfort care tomorrow after dog has visited. He understands the possibility that patient could die overnight, despite current interventions, and agrees  that care should not be escalated if patient further deteriorates.    Objective:  Physical Exam Vitals reviewed.  Constitutional:      General: He is not in acute distress.    Appearance: He is ill-appearing.  Cardiovascular:     Rate and Rhythm: Normal rate.  Pulmonary:     Effort: No respiratory distress.  Neurological:     Mental Status: He is lethargic.     Motor: Weakness present.              Palliative Medicine Assessment & Plan   Assessment: Principal  Problem:   Acute on chronic systolic CHF (congestive heart failure) (HCC) Active Problems:   Hypertension   CAD (coronary artery disease)   Gout   PAD (peripheral artery disease) (HCC)   Atrial flutter (HCC)   COPD (chronic obstructive pulmonary disease) (HCC)   AKI (acute kidney injury) (HCC)   Hepatic injury, initial encounter   Hypotension   Goals of care, counseling/discussion    Recommendations/Plan: Continue current interventions for now, allowing time today for family to assist patient with finalizing financial affairs and allowing patient's dog to visit No escalation of care Plan for transition to comfort care tomorrow PMT will continue to follow and support   Code Status: DNR - Limited   Prognosis:  Poor in the setting of end-stage heart failure  Discharge Planning: Anticipated Hospital Death  Care plan was discussed with Dr. Alease Amend, Ruddy Corral PA, and RN  Thank you for allowing the Palliative Medicine Team to assist in the care of this patient.   MDM - High  Detailed review of medical records (labs, imaging, vital signs), medically appropriate exam, discussed with treatment team, counseling and education to patient, family, & staff, documenting clinical information, coordination of care.    Glennie Rodda B Beadie Matsunaga, NP   Please contact Palliative Medicine Team phone at [phone] for questions and concerns.  For individual providers, please see AMION.

## 2024-01-21 NOTE — Progress Notes (Incomplete)
 Hospital Summary   78 y/o male w/ chronic systolic heart failure, PAD, CAD, extensive smoking history with COPD on 3 L of oxygen chronically. He has had multiple admissions over the past 6 months for acute on chronic systolic heart failure, and was readmitted on 01/17/24 w/ acute on chronic systolic heart failure with biventricular involvement + evidence of hepatic congestion and cardiorenal syndrome, felt to be end-staged. Failed w/ poor response to initial doses of IV Lasix . AHF team was consulted and he was moved to the CCU for pressor/inotropic support to facilitate palliative diuresis.  He was placed on NE and high dose lasix  gtt w/ improvement in BP and UOP, resulting in improvement in resting dyspnea but he c/w weakness, fatigue/ FTT and progressive renal failure.  Given age and severity of other co morbidities,  he was deemed not a candidate for advanced therapies. Palliative care team was consulted for GOC discussion. Decision made to move to comfort care after all of his family and dogs had the change to visit. He was made DNR/DNI.

## 2024-01-21 NOTE — Progress Notes (Signed)
 In and out cath done. 1200 cc yellow, clear urine obtained.

## 2024-01-21 NOTE — TOC Initial Note (Signed)
 Transition of Care [facility]) - Initial/Assessment Note    Patient Details  Name: Richard Rivers MRN: [id number] Date of Birth: 08/14/1946  [facility] CM/SW Contact:    Ernst Heap Phone Number: [phone] 01/21/2024, 3:06 PM  Clinical Narrative:     3:05 pm- HF CSW attempted to call the patients phone. HF CSW could not leave a VM due to the inobx being full. CSW will continue to make attempts to speak with patient.   TOC will continue following.         Patient Goals and CMS Choice            Expected Discharge Plan and Services                                              Prior Living Arrangements/Services                       Activities of Daily Living   ADL Screening (condition at time of admission) Independently performs ADLs?: No Does the patient have a NEW difficulty with bathing/dressing/toileting/self-feeding that is expected to last >3 days?: Yes (Initiates electronic notice to provider for possible OT consult) Does the patient have a NEW difficulty with getting in/out of bed, walking, or climbing stairs that is expected to last >3 days?: Yes (Initiates electronic notice to provider for possible PT consult) Does the patient have a NEW difficulty with communication that is expected to last >3 days?: No Is the patient deaf or have difficulty hearing?: No Does the patient have difficulty seeing, even when wearing glasses/contacts?: Yes Does the patient have difficulty concentrating, remembering, or making decisions?: Yes  Permission Sought/Granted                  Emotional Assessment              Admission diagnosis:  Acute CHF (HCC) [I50.9] Acute systolic CHF (congestive heart failure) (HCC) [I50.21] Acute HFrEF (heart failure with reduced ejection fraction) (HCC) [I50.21] Patient Active Problem List   Diagnosis Date Noted   Elevated LFTs 01/18/2024   Acute CHF (HCC) 01/18/2024   Hepatic injury,  initial encounter 01/18/2024   Hypotension 01/18/2024   Goals of care, counseling/discussion 01/18/2024   Acute on chronic systolic CHF (congestive heart failure) (HCC) 01/17/2024   AKI (acute kidney injury) (HCC) 01/17/2024   Acute exacerbation of CHF (congestive heart failure) (HCC) 11/24/2023   Acute on chronic heart failure with reduced ejection fraction (HFrEF, <= 40%) (HCC) 11/24/2023   Carotid stenosis 10/23/2023   Protein-calorie malnutrition, severe 10/19/2023   Multifocal pneumonia 10/16/2023   Atrial flutter (HCC) 10/16/2023   COPD (chronic obstructive pulmonary disease) (HCC) 10/16/2023   Marijuana use 10/16/2023   Prolonged QT interval 10/16/2023   Hyperkalemia 10/16/2023   Malnutrition of moderate degree 09/14/2023   NSTEMI (non-ST elevated myocardial infarction) (HCC) 09/12/2023   HFrEF (heart failure with reduced ejection fraction) (HCC) 09/03/2023   Pulmonary hypertension (HCC) 09/03/2023   PAD (peripheral artery disease) (HCC) 09/02/2023   Neuropathy 09/02/2023   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, level unspecified, initial encounter (HCC) 09/02/2023   COPD with acute exacerbation (HCC) 09/02/2023   Depression with anxiety 09/01/2023   Pleural effusion 09/01/2023   Myocardial injury 09/01/2023   Hypertension    HLD (hyperlipidemia)    CAD (  coronary artery disease)    Gout    Special screening for malignant neoplasms, colon    Benign neoplasm of appendix    Benign neoplasm of sigmoid colon    Benign neoplasm of ascending colon    Benign neoplasm of transverse colon    Fracture of neck of femur (HCC) 02/01/2015   PCP:  [facility]:   [id number] [place], [place] - [address] AT [facility] [address] [facility] [address] [place] [address] Phone: [phone] Fax: [phone]     Social Drivers of Health (SDOH) Social History: SDOH Screenings   Food Insecurity: No Food  Insecurity (01/19/2024)  Housing: Low Risk  (01/19/2024)  Transportation Needs: No Transportation Needs (01/19/2024)  Utilities: Not At Risk (01/19/2024)  Alcohol Screen: Low Risk  (09/13/2023)  Financial Resource Strain: Low Risk  (09/13/2023)  Social Connections: Socially Isolated (01/19/2024)  Tobacco Use: High Risk (01/18/2024)   SDOH Interventions:     Readmission Risk Interventions    10/22/2023   11:51 AM  Readmission Risk Prevention Plan  Transportation Screening Complete  Medication Review (RN Care Manager) Complete  HRI or Home Care Consult Complete  SW Recovery Care/Counseling Consult Complete  Palliative Care Screening Not Applicable  Skilled Nursing Facility Not Applicable

## 2024-01-22 DIAGNOSIS — I5023 Acute on chronic systolic (congestive) heart failure: Secondary | ICD-10-CM | POA: Diagnosis not present

## 2024-01-22 DIAGNOSIS — R0600 Dyspnea, unspecified: Secondary | ICD-10-CM

## 2024-01-22 DIAGNOSIS — N179 Acute kidney failure, unspecified: Secondary | ICD-10-CM | POA: Diagnosis not present

## 2024-01-22 DIAGNOSIS — Z515 Encounter for palliative care: Secondary | ICD-10-CM | POA: Diagnosis not present

## 2024-01-22 DIAGNOSIS — J42 Unspecified chronic bronchitis: Secondary | ICD-10-CM | POA: Diagnosis not present

## 2024-01-22 LAB — CBC
HCT: 28.4 % — ABNORMAL LOW (ref 39.0–52.0)
Hemoglobin: 8.3 g/dL — ABNORMAL LOW (ref 13.0–17.0)
MCH: 25.7 pg — ABNORMAL LOW (ref 26.0–34.0)
MCHC: 29.2 g/dL — ABNORMAL LOW (ref 30.0–36.0)
MCV: 87.9 fL (ref 80.0–100.0)
Platelets: 97 10*3/uL — ABNORMAL LOW (ref 150–400)
RBC: 3.23 MIL/uL — ABNORMAL LOW (ref 4.22–5.81)
RDW: 17.2 % — ABNORMAL HIGH (ref 11.5–15.5)
WBC: 14.6 10*3/uL — ABNORMAL HIGH (ref 4.0–10.5)
nRBC: 0.2 % (ref 0.0–0.2)

## 2024-01-22 LAB — COOXEMETRY PANEL
Carboxyhemoglobin: 2.1 % — ABNORMAL HIGH (ref 0.5–1.5)
Methemoglobin: 0.7 % (ref 0.0–1.5)
O2 Saturation: 93.5 %
Total hemoglobin: 8.6 g/dL — ABNORMAL LOW (ref 12.0–16.0)

## 2024-01-22 LAB — BASIC METABOLIC PANEL WITH GFR
Anion gap: 10 (ref 5–15)
BUN: 89 mg/dL — ABNORMAL HIGH (ref 8–23)
CO2: 28 mmol/L (ref 22–32)
Calcium: 8.4 mg/dL — ABNORMAL LOW (ref 8.9–10.3)
Chloride: 97 mmol/L — ABNORMAL LOW (ref 98–111)
Creatinine, Ser: 2.14 mg/dL — ABNORMAL HIGH (ref 0.61–1.24)
GFR, Estimated: 31 mL/min — ABNORMAL LOW (ref >60)
Glucose, Bld: 175 mg/dL — ABNORMAL HIGH (ref 70–99)
Potassium: 3.9 mmol/L (ref 3.5–5.1)
Sodium: 135 mmol/L (ref 135–145)

## 2024-01-22 LAB — GLUCOSE, CAPILLARY: Glucose-Capillary: 133 mg/dL — ABNORMAL HIGH (ref 70–99)

## 2024-01-22 MED ORDER — LORAZEPAM 2 MG/ML IJ SOLN: 1.0000 mg | INTRAMUSCULAR | Status: DC | PRN

## 2024-01-22 MED ORDER — GABAPENTIN 300 MG PO CAPS: 300.0000 mg | ORAL_CAPSULE | Freq: Two times a day (BID) | ORAL | Status: DC

## 2024-01-22 MED ORDER — HYDROMORPHONE BOLUS VIA INFUSION
1.0000 mg | INTRAVENOUS | Status: DC | PRN
  Administered 2024-01-22: 1 mg via INTRAVENOUS

## 2024-01-22 MED ORDER — HYDROMORPHONE HCL 1 MG/ML IJ SOLN
0.5000 mg | INTRAMUSCULAR | Status: DC | PRN
  Administered 2024-01-22: 0.5 mg via INTRAVENOUS
  Filled 2024-01-22: qty 1

## 2024-01-22 MED ORDER — GLYCOPYRROLATE 0.2 MG/ML IJ SOLN: 0.4000 mg | INTRAMUSCULAR | Status: DC | PRN

## 2024-01-22 MED ORDER — SODIUM CHLORIDE 0.9 % IV SOLN: INTRAVENOUS | Status: DC

## 2024-01-22 MED ORDER — ACETAMINOPHEN 650 MG RE SUPP: 650.0000 mg | Freq: Four times a day (QID) | RECTAL | Status: DC | PRN

## 2024-01-22 MED ORDER — HYDROMORPHONE HCL-NACL 50-0.9 MG/50ML-% IV SOLN
0.0000 mg/h | INTRAVENOUS | Status: DC
  Administered 2024-01-22: 1 mg/h via INTRAVENOUS
  Filled 2024-01-22: qty 100

## 2024-01-22 MED ORDER — POLYVINYL ALCOHOL 1.4 % OP SOLN: 1.0000 [drp] | Freq: Four times a day (QID) | OPHTHALMIC | Status: DC | PRN

## 2024-01-22 MED ORDER — ACETAMINOPHEN 325 MG PO TABS: 650.0000 mg | ORAL_TABLET | Freq: Four times a day (QID) | ORAL | Status: DC | PRN

## 2024-01-22 NOTE — Progress Notes (Signed)
 Palliative Medicine Progress Note   Patient Name: Richard Rivers       Date: 01/22/2024 DOB: [birth year]  Age: 78 y.o. MRN#: [id number] Attending Physician: Lauralee Poll, MD Primary Care Physician: [facility] Admit Date: 01/17/2024  Reason for Consultation/Follow-up: {Reason for Consult:23484}  HPI/Patient Profile: 78 y.o. male  with past medical history of advanced COPD, chronic respiratory failure on home O2, chronic HFrEF, persistent atrial fibrillation, CAD status post CABG,  carotid artery disease with left CEA, and PAD s/p right femoropopliteal bypass who presented to the ED on 01/17/2024 with chest pain, shortness of breath, and cough. He is admitted with acute on chronic HF (with EF now less than 20%), hypotension, AKI, and acute on chronic anemia.    Per HF team, patient is not a candidate for advanced therapies.    Palliative Medicine was consulted for goals of care.   Subjective: Chart reviewed. Update received from RN. Patient assessed at bedside. He reported some discomfort earlier this morning.   I met family in the waiting room, and escorted them into the room with patient's beloved dog.    Objective:  Physical Exam Vitals reviewed.  Constitutional:      General: He is not in acute distress.    Appearance: He is ill-appearing.  Cardiovascular:     Rate and Rhythm: Normal rate.  Pulmonary:     Effort: No respiratory distress.  Neurological:     Mental Status: He is lethargic.     Motor: Weakness present.             Palliative Medicine Assessment & Plan   Assessment: Principal Problem:   Acute on chronic systolic CHF (congestive heart failure) (HCC) Active Problems:   Hypertension   CAD (coronary artery disease)   Gout   PAD (peripheral  artery disease) (HCC)   Atrial flutter (HCC)   COPD (chronic obstructive pulmonary disease) (HCC)   AKI (acute kidney injury) (HCC)   Hepatic injury, initial encounter   Hypotension   Goals of care, counseling/discussion    Recommendations/Plan: Transition to comfort care Start continuous dilaudid  infusion D/C labs, CBG monitoring Unrestricted visitation When family is ready, wean off levophed and lasix  drips Ongoing palliative support  Code Status: DNR - Comfort   Prognosis:  Hours - Days  Discharge Planning: Anticipated Hospital  Death  Care plan was discussed with ***  Thank you for allowing the Palliative Medicine Team to assist in the care of this patient.   ***   Wynetta Heckle, NP   Please contact Palliative Medicine Team phone at [phone] for questions and concerns.  For individual providers, please see AMION.

## 2024-01-22 NOTE — Progress Notes (Signed)
 Patient ID: Richard Rivers, male   DOB: 07-28-1946, 78 y.o.   MRN: [id number]     Advanced Heart Failure Rounding Note  Cardiologist: Eilleen Grates, MD  Chief Complaint: Acute on Chronic Systolic Heart Failure   Patient Profile   78 y/o male w/ chronic systolic heart failure, PAD, CAD, extensive smoking history with COPD on 3 L of oxygen. He has had multiple admissions over the past 6 months for acute on chronic systolic heart failure, now readmitted w/ acute on chronic systolic heart failure with biventricular involvement + evidence of hepatic congestion and cardiorenal syndrome, felt to be end-staged. Moved to CCU for pressor/inotropic support to facilitate palliative diuresis.  Subjective:    Continues on fixed dose NE at 5 mcg/min. SBPs 90s, Co-ox inaccurate this morning. CVP down to 3.   On Lasix  gtt at 30/hr. I/Os net negative 2559. SCr>>2.64>>2.29>>2.14.    Weak/tired, not short of breath at rest.    Objective:   Weight Range: 71 kg Body mass index is 21.23 kg/m.   Vital Signs:   Temp:  [97.7 F (36.5 C)-98.4 F (36.9 C)] 98.3 F (36.8 C) (05/24 0747) Pulse Rate:  [75-97] 89 (05/24 0700) Resp:  [14-25] 19 (05/24 0700) BP: (88-119)/(41-89) 104/53 (05/24 0700) SpO2:  [99 %-100 %] 100 % (05/24 0700) Weight:  [71 kg] 71 kg (05/24 0446) Last BM Date : 01/21/24  Weight change: Filed Weights   01/20/24 0500 01/21/24 0426 01/22/24 0446  Weight: 66.7 kg 65.4 kg 71 kg    Intake/Output:   Intake/Output Summary (Last 24 hours) at 01/22/2024 0911 Last data filed at 01/22/2024 0700 Gross per 24 hour  Intake 737.59 ml  Output 2750 ml  Net -2012.41 ml      Physical Exam    CVP 3  General: Thin, fatigued-appearing Neck: No JVD, no thyromegaly or thyroid nodule.  Lungs: Clear to auscultation bilaterally with normal respiratory effort. CV: Lateral PMI.  Heart regular S1/S2, no S3/S4, no murmur.  No peripheral edema.    Abdomen: Soft, nontender, no  hepatosplenomegaly, no distention.  Skin: Intact without lesions or rashes.  Neurologic: Alert and oriented x 3.  Psych: Normal affect. Extremities: No clubbing or cyanosis.  HEENT: Normal.   Telemetry   NSR 80s, personally reviewed   EKG    N/A   Labs    CBC Recent Labs    01/21/24 0413 01/22/24 0326  WBC 13.2* 14.6*  HGB 8.2* 8.3*  HCT 27.7* 28.4*  MCV 86.0 87.9  PLT 84* 97*   Basic Metabolic Panel Recent Labs    40/98/11 0413 01/22/24 0326  NA 134* 135  K 3.4* 3.9  CL 95* 97*  CO2 31 28  GLUCOSE 181* 175*  BUN 95* 89*  CREATININE 2.29* 2.14*  CALCIUM  8.5* 8.4*   Liver Function Tests Recent Labs    01/19/24 1638 01/21/24 0413  AST 83* 40  ALT 205* 139*  ALKPHOS 117 123  BILITOT 1.6* 1.6*  PROT 5.7* 5.8*  ALBUMIN 2.7* 2.5*   No results for input(s): "LIPASE", "AMYLASE" in the last 72 hours. Cardiac Enzymes No results for input(s): "CKTOTAL", "CKMB", "CKMBINDEX", "TROPONINI" in the last 72 hours.  BNP: BNP (last 3 results) Recent Labs    10/16/23 0653 11/24/23 0140 01/17/24 2220  BNP 910.2* >4,500.0* >4,500.0*    ProBNP (last 3 results) No results for input(s): "PROBNP" in the last 8760 hours.   D-Dimer No results for input(s): "DDIMER" in the last 72 hours. Hemoglobin A1C Recent  Labs    01/21/24 0413  HGBA1C 5.8*   Fasting Lipid Panel No results for input(s): "CHOL", "HDL", "LDLCALC", "TRIG", "CHOLHDL", "LDLDIRECT" in the last 72 hours. Thyroid Function Tests No results for input(s): "TSH", "T4TOTAL", "T3FREE", "THYROIDAB" in the last 72 hours.  Invalid input(s): "FREET3"   Other results:   Imaging    No results found.   Medications:     Scheduled Medications:  allopurinol   300 mg Oral QPM   budesonide (PULMICORT) nebulizer solution  0.25 mg Nebulization BID   Chlorhexidine Gluconate Cloth  6 each Topical Daily   clopidogrel   75 mg Oral Daily   escitalopram   10 mg Oral QPM   famotidine   20 mg Oral QPM    feeding supplement  237 mL Oral BID BM   [START ON 01/04/2024] gabapentin   300 mg Oral BID   insulin  aspart  0-15 Units Subcutaneous TID WC   insulin  glargine-yfgn  20 Units Subcutaneous Daily   pantoprazole  40 mg Oral BID   sodium chloride  flush  10-40 mL Intracatheter Q12H   tamsulosin  0.4 mg Oral Daily   traZODone   100 mg Oral QHS    Infusions:  furosemide  (LASIX ) 200 mg in dextrose  5 % 100 mL (2 mg/mL) infusion 30 mg/hr (01/22/24 0700)   norepinephrine (LEVOPHED) Adult infusion 5 mcg/min (01/22/24 0757)    PRN Medications: busPIRone , HYDROmorphone  (DILAUDID ) injection, ipratropium-albuterol , sodium chloride  flush     Assessment/Plan   Acute on chronic HFrEF/Cardiogenic shock -EF 50% at time of LM PCI in 2023 -EF later 35-40% in 01/25 -Echo this admit EF < 20%, RV moderately reduced, RVSP 48 mmHg, moderate BAE, mild to moderate MR -Admission Lactic acid was normal. However, with AKI, elevated LFTs and hypotension significant concern for cardiogenic shock -He is end-stage w/ NHYHA class IV symptoms.  -He is not a candidate for advanced therapies or dialysis.  -CVP 3 today, will stop Lasix  gtt.  -He is on fixed dose NE at 5.  Will continue until his dog has visited, then plan to transition to comfort care.    2. AKI -Likely cardiorenal +/- ATN d/t shock -Baseline Scr 1.2, Scr peaked to 2.8 this admit -SCr improved today w/ NE and diuresis, down to 2.14 -Continue NE support and diuresis per above.  -Not an HD candidate   3. Elevated LFTs -In setting of shock  -Also with evidence of liver cirrhosis on US  -Continue HF optimization per above    4. PAF -Currently NSR -Anticoagulation on hold d/t severe anemia -Amiodarone  stopped with elevated LFTs   5. CAD -Unstable angina 07/23 s/p LM stenting. 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 -LHC 02/25: moderate in stent restenosis involving LM stent, CTO RCA with left to right collaterals, 70% D1, 50% p  LCX. CAD managed medically -HS troponin 389>326. Suspect demand ischemia with volume overload/cardiogenic shock.  -On plavix . No statin with elevated LFTs   5. COPD -On home O2 -previously 4 ppd smoker   6. Anemia -Transfused w/ 2 u in February. Did not have GI workup -Hgb 6.7 on admit>>transfused 1uRBCs>>Hgb improved 8.2 today  -He denies gross bleeding but w/ elevated BUN (104) ? Possible GIB -Given he is likely heading toward palliative care, will not pursue additional w/u.  -Labs c/w IDA. Has received IV Fe    7. Hypokalemia - Resolved.    8. GOC: Confirmed DNR status with patient -Poor prognosis.  -Palliative care following, plan for transition to comfort care this morning after  his dog visits.  Anticipate in-hospital death.  Will get comfort meds. Discussed with patient and family today.   CRITICAL CARE Performed by: Peder Bourdon   Total critical care time: 35 minutes  Critical care time was exclusive of separately billable procedures and treating other patients.  Critical care was necessary to treat or prevent imminent or life-threatening deterioration.  Critical care was time spent personally by me on the following activities: development of treatment plan with patient and/or surrogate as well as nursing, discussions with consultants, evaluation of patient's response to treatment, examination of patient, obtaining history from patient or surrogate, ordering and performing treatments and interventions, ordering and review of laboratory studies, ordering and review of radiographic studies, pulse oximetry and re-evaluation of patient's condition.   Length of Stay: 4  Peder Bourdon, MD  01/22/2024, 9:11 AM  Advanced Heart Failure Team Pager [phone] (M-F; 7a - 5p)  Please contact [facility] for night-coverage after hours (5p -7a ) and weekends on [email or web address]

## 2024-01-22 NOTE — Progress Notes (Signed)
 Report received from [facility], assumed care of patient at 2320.

## 2024-01-22 NOTE — Progress Notes (Signed)
 PROGRESS NOTE    Richard Rivers  ZOX:[id number] DOB: 1946-08-15 DOA: 01/17/2024 PCP: [facility]   77/M with 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  - 5th hospitalization this year - In the ED he was hypoxic, creatinine 2.8, BNP> 4500, troponin 400, 382, chest x-ray with hyperinflation cardiomegaly pulmonary vascular congestion and small pleural effusion, cards consulting - Admitted, started on diuretics, concern for low output state and hypotension - 5/21 seen by heart failure team, started on Levophed, transferred to ICU, hemoglobin down to 6.7, transfusing, holding IV heparin  - 5/23, palliative care meeting, likely transition to comfort care soon  Subjective: - Feels poorly, breathing a little better  Assessment and Plan:  Acute on chronic systolic and diastolic CHF Hypotension -Echo now with EF less than 20%, moderately reduced RV moderate MR, moderate TR -CHF team consulting, concern for end-stage heart failure, not a candidate for advanced therapies -Currently on norepinephrine, Lasix  gtt. - Discussed poor prognosis with patient's son and his wife at bedside yesterday, recommended hospice - Appreciate palliative care assistance, plan to transition to comfort care  CAD history of PCI Continue clopidogrel .  -Holding heparin  with worsening anemia - Statin on hold  AKI (acute kidney injury) (HCC) -Baseline creatinine around 1.5, now 2.8 this admission, suspect cardiorenal etiology - Now improving on norepinephrine  Persistent Afib/Atrial flutter (HCC) Beta-blocker on hold Temporarily holding IV heparin  with worsening anemia  Acute on chronic anemia Severe iron deficiency - Poor candidate for GI workup at this time, transfused 1 unit  PRBC 5/21, add  PPI, monitor hemoglobin - Conservative management  PAD (peripheral artery disease) (HCC) Continue clopidogrel    Advanced COPD Chronic respiratory failure on 3 L home O2 -Previously smoked 4 packs/day, now down to 1 pack/day - Prognosis is very poor - Continue DuoNeb,  Pulmicort  DM2, hyperglycemia -Continue current dose of Semglee, A1c is 5.8  Gout No signs of exacerbation   DVT prophylaxis: SCDs Code Status: Discussed CODE STATUS at length 5/21, he was finally agreeable to DNR Family Communication: son at bedside yesterday Disposition Plan: May need residential hospice  Consultants: Cards   Objective: Vitals:   01/22/24 0900 01/22/24 0915 01/22/24 0930 01/22/24 0945  BP: 96/64  100/67 (!) 95/51  Pulse: 88 90 92 90  Resp: 17 16 18 20   Temp:      TempSrc:      SpO2: 100% 100% 100% 100%  Weight:      Height:        Intake/Output Summary (Last 24 hours) at 01/22/2024 1036 Last data filed at 01/22/2024 0900 Gross per 24 hour  Intake 805.17 ml  Output 2750 ml  Net -1944.83 ml   Filed Weights   01/20/24 0500 01/21/24 0426 01/22/24 0446  Weight: 66.7 kg 65.4 kg 71 kg    Examination:  General exam: Frail cachectic elderly male appears older than stated age, AO x 3 HEENT: Positive JVD CVS: Poor air movement, few scattered rhonchi Cardiovascular system: S1 & S2 heard, RRR.  Abd: nondistended, soft and nontender.Normal bowel sounds heard. Extremities: Trace edema Neuro: Involuntary twitches, movements noted Skin: No rashes Psychiatry: Flat affect   Data Reviewed:   CBC: Recent Labs  Lab 01/17/24 2220 01/18/24 0354 01/19/24 1136 01/19/24 1638 01/20/24 0410 01/21/24 0413 01/22/24 8119  WBC 9.8 9.8 11.9* 11.5* 12.6* 13.2* 14.6*  NEUTROABS 7.6 7.8*  --   --   --   --   --   HGB 7.1* 7.3* 6.7* 8.0* 8.2* 8.2* 8.3*  HCT 24.6* 25.2* 23.0* 27.0* 27.8* 27.7* 28.4*  MCV 85.7 86.0 83.9 86.0 86.1 86.0 87.9  PLT 86* 81* 93* 98* 91* 84* 97*    Basic Metabolic Panel: Recent Labs  Lab 01/18/24 0354 01/19/24 0426 01/19/24 1638 01/20/24 0410 01/20/24 1723 01/21/24 0413 01/22/24 0326  NA 136 135 133* 132* 134* 134* 135  K 4.1 3.7 3.4* 4.1 4.2 3.4* 3.9  CL 97* 98 95* 96* 96* 95* 97*  CO2 28 29 28 28 29 31 28   GLUCOSE 107* 103* 230* 286* 191* 181* 175*  BUN 111* 113* 103* 104* 103* 95* 89*  CREATININE 2.84* 2.80* 2.67* 2.64* 2.32* 2.29* 2.14*  CALCIUM  9.1 8.8* 8.6* 8.3* 8.5* 8.5* 8.4*  MG 2.4 2.3  --   --   --   --   --    GFR: Estimated Creatinine Clearance: 28.1 mL/min (A) (by C-G formula based on SCr of 2.14 mg/dL (H)). Liver Function Tests: Recent Labs  Lab 01/17/24 2124 01/18/24 0354 01/19/24 0426 01/19/24 1638 01/21/24 0413  AST 218* 177* 105* 83* 40  ALT 359* 314* 232* 205* 139*  ALKPHOS 141* 125 124 117 123  BILITOT 1.7* 1.7* 1.3* 1.6* 1.6*  PROT 6.4* 6.0* 5.5* 5.7* 5.8*  ALBUMIN 3.1* 2.9* 2.7* 2.7* 2.5*   No results for input(s): "LIPASE", "AMYLASE" in the last 168 hours. No results for input(s): "AMMONIA" in the last 168 hours. Coagulation Profile: Recent Labs  Lab 01/18/24 0354  INR 1.4*   Cardiac Enzymes: No results for input(s): "CKTOTAL", "CKMB", "CKMBINDEX", "TROPONINI" in the last 168 hours. BNP (last 3 results) No results for input(s): "PROBNP" in the last 8760 hours. HbA1C: Recent Labs    01/21/24 0413  HGBA1C 5.8*   CBG: Recent Labs  Lab 01/21/24 0638 01/21/24 1141 01/21/24 1538 01/21/24 2105 01/22/24 0612  GLUCAP 101* 147* 165* 181* 133*   Lipid Profile: No results for input(s): "CHOL", "HDL", "LDLCALC", "TRIG", "CHOLHDL", "LDLDIRECT" in the last 72 hours. Thyroid Function Tests: No results for input(s): "TSH", "T4TOTAL", "FREET4", "T3FREE", "THYROIDAB" in the last 72 hours.  Anemia Panel: Recent Labs    01/19/24 1239  VITAMINB12 1,381*  FOLATE 13.4  FERRITIN 23*  TIBC 389  IRON 12*  RETICCTPCT 2.0   Urine analysis:    Component Value Date/Time    COLORURINE YELLOW 01/18/2024 0520   APPEARANCEUR CLEAR 01/18/2024 0520   LABSPEC 1.011 01/18/2024 0520   PHURINE 5.0 01/18/2024 0520   GLUCOSEU 50 (A) 01/18/2024 0520   HGBUR NEGATIVE 01/18/2024 0520   BILIRUBINUR NEGATIVE 01/18/2024 0520   KETONESUR NEGATIVE 01/18/2024 0520   PROTEINUR 100 (A) 01/18/2024 0520   UROBILINOGEN 1.0 10/05/2009 1342   NITRITE NEGATIVE 01/18/2024 0520   LEUKOCYTESUR NEGATIVE 01/18/2024 0520   Sepsis Labs: @LABRCNTIP (procalcitonin:4,lacticidven:4)  ) Recent Results (from the past 240 hours)  MRSA Next Gen by PCR, Nasal     Status: None   Collection Time: 01/20/24  4:15 AM   Specimen: Nasal Mucosa; Nasal Swab  Result Value Ref Range Status   MRSA by PCR Next Gen NOT DETECTED NOT DETECTED Final    Comment: (NOTE) The GeneXpert MRSA Assay (FDA approved for NASAL specimens only), is one component of a comprehensive MRSA colonization surveillance program. It is not intended to diagnose MRSA infection nor to  guide or monitor treatment for MRSA infections. Test performance is not FDA approved in patients less than 34 years old. Performed at [facility], [address] [place], [place] [address]      Radiology Studies: No results found.    Scheduled Meds:  budesonide (PULMICORT) nebulizer solution  0.25 mg Nebulization BID   escitalopram   10 mg Oral QPM   famotidine   20 mg Oral QPM   [START ON 01/26/2024] gabapentin   300 mg Oral BID   pantoprazole  40 mg Oral BID   sodium chloride  flush  10-40 mL Intracatheter Q12H   tamsulosin  0.4 mg Oral Daily   traZODone   100 mg Oral QHS   Continuous Infusions:  sodium chloride      furosemide  (LASIX ) 200 mg in dextrose  5 % 100 mL (2 mg/mL) infusion 30 mg/hr (01/22/24 0900)   HYDROmorphone      norepinephrine (LEVOPHED) Adult infusion 5 mcg/min (01/22/24 0900)     LOS: 4 days    Time spent:    Deforest Fast, MD [facility]   01/22/2024, 10:36 AM

## 2024-01-23 DIAGNOSIS — I5023 Acute on chronic systolic (congestive) heart failure: Secondary | ICD-10-CM | POA: Diagnosis not present

## 2024-01-27 ENCOUNTER — Encounter (INDEPENDENT_AMBULATORY_CARE_PROVIDER_SITE_OTHER): Admitting: Vascular Surgery

## 2024-01-27 ENCOUNTER — Encounter (INDEPENDENT_AMBULATORY_CARE_PROVIDER_SITE_OTHER)

## 2024-01-30 NOTE — Death Summary Note (Addendum)
 Death Summary  BRUIN BOLGER FAO:[id number] DOB: 29-Oct-1945 DOA: 02-05-2024  PCP: [facility]   Admit date: 02/05/2024 Date of Death: 2024-02-11  Final Diagnoses:  Principal Problem:   Acute on chronic systolic CHF (congestive heart failure) (HCC) Active Problems:   CAD (coronary artery disease)   Hypertension   AKI (acute kidney injury) (HCC)   Atrial flutter (HCC)   PAD (peripheral artery disease) (HCC)   COPD (chronic obstructive pulmonary disease) (HCC)   Gout   Hepatic injury, initial encounter   Hypotension   Goals of care, counseling/discussion     History of present illness:  77/M with 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  - 5th hospitalization this year - In the ED he was hypoxic, creatinine 2.8, BNP> 4500, troponin 400, 382, chest x-ray with hyperinflation cardiomegaly pulmonary vascular congestion and small pleural effusion, cards consulting - Admitted, started on diuretics, concern for low output state and hypotension - 5/21 seen by heart failure team, started on Levophed, transferred to ICU, hemoglobin down to 6.7, transfusing, holding IV heparin     Hospital Course:   Acute on chronic systolic and diastolic CHF Hypotension -Echo now with EF less than 20%, moderately reduced RV moderate MR, moderate TR -CHF team consulting, concern for end-stage heart failure, not a candidate for advanced therapies -Currently on norepinephrine, Lasix  gtt. - Discussed poor prognosis with patient's son and his wife at bedside yesterday, recommended hospice - Seen by palliative care in consultation, he was transitioned to comfort care and expired on 2024-02-11   CAD history of PCI Continue clopidogrel .  -Holding heparin  with worsening anemia -  Statin on hold   AKI (acute kidney injury) (HCC) -Baseline creatinine around 1.5, now 2.8 this admission, suspect cardiorenal etiology - Now improving on norepinephrine   Persistent Afib/Atrial flutter (HCC) Beta-blocker on hold Temporarily holding IV heparin  with worsening anemia   Acute on chronic anemia Severe iron deficiency - Poor candidate for GI workup at this time, transfused 1 unit PRBC 5/21, add  PPI, monitor hemoglobin - Conservative management   PAD (peripheral artery disease) (HCC) Continue clopidogrel     Advanced COPD Chronic respiratory failure on 3 L home O2 -Previously smoked 4 packs/day, now down to 1 pack/day - Prognosis is very poor - Continue DuoNeb,  Pulmicort   DM2, hyperglycemia -Continue current dose of Semglee, A1c is 5.8   Gout No signs of exacerbation     Time:  Signed:  Deforest Fast  [facility] 01/25/2024, 1:55 PM

## 2024-01-30 DEATH — deceased

## 2024-03-16 ENCOUNTER — Other Ambulatory Visit (HOSPITAL_COMMUNITY): Payer: Self-pay
